# Patient Record
Sex: Female | Born: 1965 | Race: White | Hispanic: No | Marital: Married | Smoking: Never smoker
Health system: Southern US, Community
[De-identification: ages and names within clinical notes are randomized; demographics above are authoritative.]

## PROBLEM LIST (undated history)

## (undated) DIAGNOSIS — Z87898 Personal history of other specified conditions: Secondary | ICD-10-CM

## (undated) DIAGNOSIS — K219 Gastro-esophageal reflux disease without esophagitis: Secondary | ICD-10-CM

## (undated) DIAGNOSIS — R0602 Shortness of breath: Secondary | ICD-10-CM

## (undated) DIAGNOSIS — E876 Hypokalemia: Secondary | ICD-10-CM

## (undated) DIAGNOSIS — R55 Syncope and collapse: Secondary | ICD-10-CM

## (undated) DIAGNOSIS — R079 Chest pain, unspecified: Secondary | ICD-10-CM

## (undated) DIAGNOSIS — E785 Hyperlipidemia, unspecified: Secondary | ICD-10-CM

## (undated) DIAGNOSIS — F3181 Bipolar II disorder: Secondary | ICD-10-CM

## (undated) DIAGNOSIS — G43909 Migraine, unspecified, not intractable, without status migrainosus: Secondary | ICD-10-CM

## (undated) HISTORY — DX: Shortness of breath: R06.02

## (undated) HISTORY — DX: Syncope and collapse: R55

## (undated) HISTORY — DX: Chest pain, unspecified: R07.9

## (undated) HISTORY — PX: WISDOM TOOTH EXTRACTION: SHX21

## (undated) HISTORY — PX: TUBAL LIGATION: SHX77

---

## 2000-11-07 DIAGNOSIS — Z87898 Personal history of other specified conditions: Secondary | ICD-10-CM

## 2000-11-07 HISTORY — DX: Personal history of other specified conditions: Z87.898

## 2000-11-20 ENCOUNTER — Encounter: Payer: Self-pay | Admitting: Emergency Medicine

## 2000-11-20 ENCOUNTER — Emergency Department (HOSPITAL_COMMUNITY): Admission: EM | Admit: 2000-11-20 | Discharge: 2000-11-20 | Payer: Self-pay | Admitting: Emergency Medicine

## 2000-11-29 ENCOUNTER — Inpatient Hospital Stay (HOSPITAL_COMMUNITY): Admission: EM | Admit: 2000-11-29 | Discharge: 2000-12-01 | Payer: Self-pay | Admitting: Emergency Medicine

## 2000-11-29 ENCOUNTER — Encounter: Payer: Self-pay | Admitting: Emergency Medicine

## 2000-12-05 ENCOUNTER — Ambulatory Visit (HOSPITAL_COMMUNITY): Admission: RE | Admit: 2000-12-05 | Discharge: 2000-12-05 | Payer: Self-pay | Admitting: Cardiology

## 2001-01-02 ENCOUNTER — Other Ambulatory Visit: Admission: RE | Admit: 2001-01-02 | Discharge: 2001-01-02 | Payer: Self-pay | Admitting: Obstetrics and Gynecology

## 2001-01-17 ENCOUNTER — Encounter (INDEPENDENT_AMBULATORY_CARE_PROVIDER_SITE_OTHER): Payer: Self-pay | Admitting: Specialist

## 2001-01-17 ENCOUNTER — Other Ambulatory Visit: Admission: RE | Admit: 2001-01-17 | Discharge: 2001-01-17 | Payer: Self-pay | Admitting: Obstetrics and Gynecology

## 2001-02-01 ENCOUNTER — Other Ambulatory Visit: Admission: RE | Admit: 2001-02-01 | Discharge: 2001-02-01 | Payer: Self-pay | Admitting: Obstetrics and Gynecology

## 2001-02-01 ENCOUNTER — Encounter (INDEPENDENT_AMBULATORY_CARE_PROVIDER_SITE_OTHER): Payer: Self-pay | Admitting: Specialist

## 2005-08-09 ENCOUNTER — Emergency Department (HOSPITAL_COMMUNITY): Admission: EM | Admit: 2005-08-09 | Discharge: 2005-08-09 | Payer: Self-pay | Admitting: Family Medicine

## 2007-06-09 ENCOUNTER — Emergency Department (HOSPITAL_COMMUNITY): Admission: EM | Admit: 2007-06-09 | Discharge: 2007-06-09 | Payer: Self-pay | Admitting: Family Medicine

## 2008-09-10 ENCOUNTER — Emergency Department (HOSPITAL_COMMUNITY): Admission: EM | Admit: 2008-09-10 | Discharge: 2008-09-10 | Payer: Self-pay | Admitting: Family Medicine

## 2009-10-15 ENCOUNTER — Emergency Department (HOSPITAL_COMMUNITY): Admission: EM | Admit: 2009-10-15 | Discharge: 2009-10-15 | Payer: Self-pay | Admitting: Emergency Medicine

## 2010-10-21 ENCOUNTER — Emergency Department (HOSPITAL_COMMUNITY)
Admission: EM | Admit: 2010-10-21 | Discharge: 2010-10-21 | Payer: Self-pay | Source: Home / Self Care | Admitting: Emergency Medicine

## 2010-11-28 ENCOUNTER — Emergency Department (HOSPITAL_COMMUNITY)
Admission: EM | Admit: 2010-11-28 | Discharge: 2010-11-28 | Payer: Self-pay | Source: Home / Self Care | Admitting: Emergency Medicine

## 2011-01-17 LAB — POCT RAPID STREP A (OFFICE): Streptococcus, Group A Screen (Direct): NEGATIVE

## 2011-02-08 LAB — POCT I-STAT, CHEM 8
BUN: 10 mg/dL (ref 6–23)
BUN: 10 mg/dL (ref 6–23)
Calcium, Ion: 1.23 mmol/L (ref 1.12–1.32)
Calcium, Ion: 1.29 mmol/L (ref 1.12–1.32)
Chloride: 105 mEq/L (ref 96–112)
Chloride: 105 mEq/L (ref 96–112)
Creatinine, Ser: 0.7 mg/dL (ref 0.4–1.2)
Creatinine, Ser: 0.8 mg/dL (ref 0.4–1.2)
Glucose, Bld: 114 mg/dL — ABNORMAL HIGH (ref 70–99)
Glucose, Bld: 116 mg/dL — ABNORMAL HIGH (ref 70–99)
HCT: 43 % (ref 36.0–46.0)
HCT: 43 % (ref 36.0–46.0)
Hemoglobin: 14.6 g/dL (ref 12.0–15.0)
Hemoglobin: 14.6 g/dL (ref 12.0–15.0)
Potassium: 3.5 mEq/L (ref 3.5–5.1)
Potassium: 3.5 mEq/L (ref 3.5–5.1)
Sodium: 140 mEq/L (ref 135–145)
Sodium: 140 mEq/L (ref 135–145)
TCO2: 27 mmol/L (ref 0–100)
TCO2: 28 mmol/L (ref 0–100)

## 2011-02-08 LAB — MAGNESIUM: Magnesium: 2.4 mg/dL (ref 1.5–2.5)

## 2011-03-25 NOTE — Discharge Summary (Signed)
[facility]  Patient:    Alexandra Stein, Alexandra Stein                     MRN: [id number] Adm. Date:  [id number] Disc. Date: [id number] Attending:  Allean Found CC:         Arvella Merles, M.D.  Armanda Magic, M.D.  Rosanne Sack, M.D.   Discharge Summary  DISCHARGE DIAGNOSES: 1. 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  The patient not    orthostatic with normal TSH.  EEG pending.  Tilt-table test planned as an    outpatient.  No evidence of arrhythmias on telemetry. 2. History of syncope as a child in the past. 3. Tubal ligation x 2. 4. History of depression.  DISCHARGE MEDICATIONS:  None.  CONDITION ON DISCHARGE:  The patient is alert in no acute distress with no recurrent syncopal episodes this hospitalization with stable vital signs, no evidence of orthostasis or arrhythmias.  REASON FOR ADMISSION:  The patient is a 45 year old white female with Past Medical History significant for syncope who presented with recurrent syncope x 2 in the last two weeks.  HOSPITAL COURSE:  RECURRENT SYNCOPE:  It was felt to be consistent with vasovagal syncope.  Telemetry revealed no arrhythmias.  An echocardiogram by preliminary report was normal with the exception of left ventricular dimensions at the upper limit of normal.  EEGs were performed on the morning of discharge, those results are pending, and a tilt-table test will be performed by Dr. Armanda Magic as an outpatient.  The patient had no recurrent episodes while in the hospital.  There was no evidence of orthostasis.  TSH was normal at 1.02.  No evidence of metabolic derangements or anemia with a hemoglobin of 13.9.  DISCHARGE FOLLOWUP:  The patient will be discharged to home and will follow up with Dr. Mayford Knife on December 05, 2000, for tilt-table testing and review of her test results and Dr. Tiburcio Pea  as needed. DD:  12/01/00 TD:  12/02/00 Job: [id number]

## 2011-03-25 NOTE — Procedures (Signed)
[place]  Patient:    Alexandra Stein, Alexandra Stein                     MRN: [id number] Proc. Date: 12/05/00 Adm. Date:  [id number] Attending:  Armanda Magic                           Procedure Report  PROCEDURE PERFORMED:  Tilt table test  OPERATOR:  Armanda Magic, M.D.  INDICATIONS:  This is a 45 year old female with multiple episodes of syncope consistent with vasovagal syncope.  She now presents for a tilt table testing.  DESCRIPTION OF PROCEDURE:  The patient was brought to the electrophysiology laboratory in a fasting nonsedated state.  Informed consent was obtained.  The patient was connected to continuous heart rate, pulse oximeter monitoring and intermittent blood pressure monitoring.  The patients blood pressure was measured supine for five minutes and ranged from 111/76 to 121/73.  Heart rates were in the 70s.  The patient was then tilted to 70 degrees upright for a total of 30 minutes. She had several episodes of feeling hot and slightly nauseated but no significant change in her blood pressure.  She was then after 30 minutes tilted down and Isuprel was started initially at 0.5 mcg and then 1 mcg to get a 20% increase in her heart rate.  She was subsequently tilted back up to 70 degrees for a total of 15 minutes.  Heart rate increased to a maximum of 135 beats per minute.  There was no significant change in her blood pressure.  She did feel hot and light lighted with tingling and nausea but no syncope and no significant changes in blood pressure.  She was then tilted back down and transferred back to her room in stable condition.  IMPRESSION:  Negative tilt table test.  PLAN:  Will treat empirically for vasovagal syncope. She is already on Prozac 20 mg a day, we will increase to 40 mg a day.  I am going to give her a prescription for Toprol 25 mg a day as well.  She will follow up with me in two weeks. DD:  12/05/00 TD:  12/05/00 Job:  [id number]

## 2011-03-25 NOTE — Consult Note (Signed)
[facility]  Patient:    Alexandra Stein, Alexandra Stein                       MRN: [id number] Proc. Date: 11/30/00 Attending:  Armanda Magic, M.D.                          Consultation Report  CHIEF COMPLAINT:  Syncope.  HISTORY OF PRESENT ILLNESS:  This is a 45 year old white female with a history of syncope as a child who presented to the emergency room after being at school and walking across the playground and tripped over mulch and fell on her right elbow.  She had severe pain and then had a syncopal episode. Apparently, she had stiff back with toes pointed down for 40 minutes by witnesses.  She was evaluated in the hospital and it was felt to be vasovagal syncope.  This was on January 14.  Was discharged home.  Throughout that weekend she was somewhat somnolent with off balance.  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  She was running late that day and hurried to try to get her kids to lunch.  She was running down the hall and became lightheaded and nauseated.  Went to sit down and then continued to feel lightheaded and hot and then had syncope.  She presented to [facility] ER. Her initial workup has been negative.  PAST MEDICAL HISTORY:  Significant for syncope x 2 as a child, both episodes occurred when she had severe pain.  PAST SURGICAL HISTORY:  Failed tubal ligation.  ALLERGIES:  CODEINE.  MEDICATIONS:  Prozac which she has been off for one week.  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  She has three boys.  She teaches at [employer].  She denies any tobacco or alcohol use.  FAMILY HISTORY:  Her mother has dilated cardiomyopathy with no coronary disease.  She had cardiac arrest and now has an ICD.  She denies any coronary artery disease, hypertension, or diabetes in the family.  There is a history of lung cancer and breast cancer.  REVIEW OF SYSTEMS:  As stated in HPI.  PHYSICAL  EXAMINATION:  VITAL SIGNS:  Blood pressure 104/61 with heart rate of 82 lying supine, sitting blood pressure 104/64 with heart rate of 84, standing blood pressure 111/70 with heart rate of 93.  GENERAL:  This is a well-developed, well-nourished white female in no acute distress.  HEENT:  Pupils are equal, round and reactive to light and accommodation. Extraocular movements are intact bilaterally.  No scleral icterus.  NECK:  Supple without lymphadenopathy.  No carotid bruits.  Carotid upstrokes good.  LUNGS:  Clear to auscultation throughout.  HEART:  Regular rate and rhythm.  No murmurs, rubs, gallops.  Normal S1, S2.  ABDOMEN:  Benign.  EXTREMITIES:  No cyanosis, clubbing, or edema.  Good distal pulses.  NEUROLOGIC:  She is alert and oriented x 3.  LABORATORIES:  EKG shows normal sinus rhythm with T-wave abnormality which is nonspecific.  A 2-D echo is pending.  Laboratories are within normal limits.  ASSESSMENT:  Syncope of questionable etiology, probably ______ syncope with history of nausea, diaphoresis, and lightheadedness.  Had first episode of ______.  Second episode was when she was hurrying, both which could result in increased catechol release.  EKG shows only nonspecific T-wave changes. Differential diagnosis includes primary arrhythmia, although this is unlikely  with no history of palpitations, seizure, underlying coronary disease, although this is unlikely due to her age and no cardiac risk factors. Probably this is ______ syncope.  PLAN:  Await results of 2-D echocardiogram.  If within normal limits without any evidence of cardiomyopathy, will get tilt table testing. DD:  11/30/00 TD:  11/30/00 Job: [id number]

## 2011-03-25 NOTE — H&P (Signed)
[facility]  Patient:    Alexandra Stein, Alexandra Stein                       MRN: [id number] Adm. Date:  11/29/00 Attending:  Dario Guardian, M.D.                         History and Physical  DATE OF BIRTH:  1966/06/09  CHIEF COMPLAINT:  Syncope.  HISTORY OF PRESENT ILLNESS:  Patient is a 45 year old married white female who starts her medical history on November 20, 2000 ______ school she was walking across the playground, tripped over some mulch, and fell on her right elbow. She experienced severe pain at that time.  It progressed to loss of consciousness which was witnessed and she laid back stiffened with toes pointed downward and was taken to [facility] Emergency Room.  Her loss of consciousness, from what she was told, was approximately 40 minutes.  While at [facility] ER it was thought that she had passed out due to the pain.  She was examined and discharged.  She stayed out of work the rest of that week.  She was seen by her primary care physician, Dr. Holley Bouche at [facility] on January 18.  He did a full neurologic examination according to the patient and felt everything else was fine.  Had set her up for a neurologic evaluation in February which obviously has not been done. However, throughout the weekend she was more somnolent than her normal self. She was not on any medications for pain.  In fact, she stopped her Prozac during this week of dizziness.  She states that she has continued to have some off balance feeling.  However, she felt much better on January 22 when she returned to work.  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  Patient denied any headaches, blurred vision, or vertigo with this sensation.  Patient at that time began to feel even more  nauseated and laid down with the help of two other teachers.  When they sat her up she started to blackout for just a few seconds at that point.  It was decided to bring her to [facility] Emergency Room.  While in the emergency room she has had blood work which has all been normal.  Her CT scan of the head revealed no focal abnormality.  She continues to have some dizziness, some nausea, and now a headache since she has not eaten for over 12 hours.  PAST SURGICAL HISTORY:  No surgeries except for tubal ligation x 2.  MEDICATIONS:  Prozac 20 mg usually once a day but she has been off of them for approximately a week.  ALLERGIES:  CODEINE causing hallucinations and fever.  SOCIAL HISTORY:  She does not smoke.  No alcohol use.  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  She is married for the past 12 years.  She has three boys ages 54, 88, 39.  PAST OBSTETRICAL HISTORY:  She had a third pregnancy after her first tubal ligation.  Dr. Marcelle Overlie is her regular GYN.  FAMILY HISTORY:  Mom with cardiomyopathy with a collapse approximately six years ago, but survived.  No one with coronary artery disease, hypertension, or diabetes mellitus.  Maternal grandfather with lung cancer.  He worked in [employer] and also smoked.  Maternal aunt with breast cancer premenopausal.  Paternal great aunt breast cancer.  Maternal grandmother left leg probable sarcoma now age 60 and doing well.  REVIEW OF SYSTEMS:  Patients main concern at this time is nausea, lightheadedness, and dizziness.  She has no dyspnea, no chest pain.  She has slight headache but she states that she gets this when she has not eaten in a while.  PHYSICAL EXAMINATION:  VITAL SIGNS:  Orthostatics done per the ER staff:  Lying down blood pressure 113/72, pulse 72.  Sitting blood pressure 110/76, pulse 84.  Standing blood pressure 117/77, pulse 86.  Temperature 97.4, respiratory rate 20-24.  HEENT:  TMs are normal  bilaterally.  Throat is clear.  Dentition is good.  NEUROLOGIC:  Cranial nerves 2-11 are intact.  Pupils are equal, round and reactive to light.  Extraocular movements are intact bilaterally.  Fundi are sharp.  No papilledema seen.  LUNGS:  Clear to auscultation.  HEART:  Regular rate and rhythm with no rubs, murmurs, or gallops.  She has a normal PMI mid clavicular line.  BREASTS:  Normal.  ABDOMEN:  Positive bowel sounds.  No hepatosplenomegaly.  No tenderness.  EXTREMITIES:  Without edema or cyanosis.  She does feel some paraesthesias feeling.  Strength 5/5.  SKIN:  Unremarkable.  PELVIC:  Deferred to Dr. Marcelle Overlie.  RECTAL:  Deferred to Dr. Marcelle Overlie.  LABORATORIES:  Hemoglobin 13.4, hematocrit 38.4.  Chemistry panel basically normal with a slightly low normal potassium of 3.6, sodium 137, calcium 9.2.  Her EKG is normal sinus rhythm.  CT head was normal.  ASSESSMENT AND PLAN:  Syncopal episode x 2.  Etiology unknown at this time. It could be a neurologic etiology.  Need to rule out seizures versus cardiac causing decrease in blood pressure, although this was not found on orthostatics.  Begin workup for neurologic problem.  EEG to be scheduled. Consider neurology consult.  Consider her first episode of dizziness to be secondary to pain, but not quite sure what the second one would be caused by. Obtain a cardiology workup if neurologic turns up negative.  Patient with a history of failed bilateral tubal ligation.  Will get a serum pregnancy test to make sure this is not a second failure.  Given Phenergan for nausea.  Treat headaches with Tylenol or ibuprofen.  I doubt any of this is secondary to being off of Prozac for a week but this must be considered. DD:  11/29/00 TD:  11/29/00 Job: [id number]

## 2011-07-25 ENCOUNTER — Encounter (HOSPITAL_COMMUNITY): Payer: Self-pay

## 2011-07-25 ENCOUNTER — Other Ambulatory Visit (HOSPITAL_COMMUNITY): Payer: Self-pay | Admitting: Family Medicine

## 2011-07-25 ENCOUNTER — Ambulatory Visit (HOSPITAL_COMMUNITY)
Admission: RE | Admit: 2011-07-25 | Discharge: 2011-07-25 | Disposition: A | Discharge status: Home / Self Care | Payer: 59 | Source: Ambulatory Visit | Attending: Family Medicine | Admitting: Family Medicine

## 2011-07-25 DIAGNOSIS — R51 Headache: Secondary | ICD-10-CM

## 2011-10-12 ENCOUNTER — Encounter (HOSPITAL_COMMUNITY): Payer: Self-pay | Admitting: Emergency Medicine

## 2011-10-12 ENCOUNTER — Emergency Department (HOSPITAL_COMMUNITY)
Admission: EM | Admit: 2011-10-12 | Discharge: 2011-10-12 | Disposition: A | Discharge status: Home / Self Care | Payer: Self-pay | Attending: Emergency Medicine | Admitting: Emergency Medicine

## 2011-10-12 DIAGNOSIS — R209 Unspecified disturbances of skin sensation: Secondary | ICD-10-CM | POA: Insufficient documentation

## 2011-10-12 DIAGNOSIS — X19XXXA Contact with other heat and hot substances, initial encounter: Secondary | ICD-10-CM | POA: Insufficient documentation

## 2011-10-12 DIAGNOSIS — Y93G3 Activity, cooking and baking: Secondary | ICD-10-CM | POA: Insufficient documentation

## 2011-10-12 DIAGNOSIS — T300 Burn of unspecified body region, unspecified degree: Secondary | ICD-10-CM

## 2011-10-12 DIAGNOSIS — T23169A Burn of first degree of back of unspecified hand, initial encounter: Secondary | ICD-10-CM | POA: Insufficient documentation

## 2011-10-12 DIAGNOSIS — M79609 Pain in unspecified limb: Secondary | ICD-10-CM | POA: Insufficient documentation

## 2011-10-12 MED ORDER — SILVER SULFADIAZINE 1 % EX CREA
TOPICAL_CREAM | Freq: Every day | CUTANEOUS | Status: DC
  Administered 2011-10-12: 21:00:00 via TOPICAL
  Filled 2011-10-12: qty 85

## 2011-10-12 MED ORDER — PREDNISONE 10 MG PO TABS: 20.0000 mg | ORAL_TABLET | Freq: Every day | ORAL | Status: DC

## 2011-10-12 NOTE — ED Notes (Signed)
Pt st's she burned her hand on a hot pot 6 days ago.  St's area blistered, then appeared to be getting infected.   St's to her hand is starting to feel numb.

## 2011-10-12 NOTE — ED Provider Notes (Signed)
History     CSN: [id number] Arrival date & time: 10/12/2011  5:21 PM   None     No chief complaint on file.   (Consider location/radiation/quality/duration/timing/severity/associated sxs/prior treatment) Patient is a 45 y.o. female presenting with burn. The history is provided by the patient.  Burn The incident occurred more than 2 days ago. The burns occurred in the kitchen. The burns occurred while cooking. The burns were a result of contact with a hot surface. The burns are located on the right hand. The burns appear red and painful. The pain is at a severity of 5/10. The pain is moderate. She has tried ice and salve for the symptoms. The treatment provided moderate relief.   Pt burned her hand on pot 6 days ago and has since then been healing well. Pt has been feeling sensations of tingling and numbness to the area but no pain.   History reviewed. No pertinent past medical history.  Past Surgical History  Procedure Date  . Cesarean section     No family history on file.  History  Substance Use Topics  . Smoking status: Not on file  . Smokeless tobacco: Not on file  . Alcohol Use:     OB History    Grav Para Term Preterm Abortions TAB SAB Ect Mult Living                  Review of Systems  All other systems reviewed and are negative.    Allergies  Codeine  Home Medications   Current Outpatient Rx  Name Route Sig Dispense Refill  . ARIPIPRAZOLE 5 MG PO TABS Oral Take 5 mg by mouth every morning.      . DULOXETINE HCL 60 MG PO CPEP Oral Take 60 mg by mouth daily.      Marland Kitchen LAMOTRIGINE ER 200 MG PO TB24 Oral Take 1 tablet by mouth every evening.      Marland Kitchen OLANZAPINE 5 MG PO TABS Oral Take 5 mg by mouth at bedtime.      . TOPIRAMATE 100 MG PO TABS Oral Take 100 mg by mouth at bedtime.        BP 111/70  Pulse 66  Temp(Src) 97.8 F (36.6 C) (Oral)  Resp 16  SpO2 100%  Physical Exam  Constitutional: She appears well-developed and well-nourished.  HENT:    Head: Normocephalic and atraumatic.  Eyes: Pupils are equal, round, and reactive to light.  Neck: Trachea normal, normal range of motion and full passive range of motion without pain. Neck supple.  Cardiovascular: Normal rate, regular rhythm and normal pulses.   Pulmonary/Chest: Effort normal and breath sounds normal. Chest wall is not dull to percussion. She exhibits no tenderness, no crepitus, no edema, no deformity and no retraction.  Abdominal: Normal appearance.  Musculoskeletal: Normal range of motion.       Right hand: She exhibits bony tenderness. She exhibits normal range of motion, no tenderness, normal two-point discrimination, normal capillary refill, no deformity, no laceration and no swelling. normal sensation noted. Normal strength noted.       Hands: Neurological: She is alert. She has normal strength.  Skin: Skin is warm, dry and intact.  Psychiatric: She has a normal mood and affect. Her speech is normal. Thought content normal. Cognition and memory are normal.    ED Course  Procedures (including critical care time)  Labs Reviewed - No data to display No results found.   No diagnosis found.    MDM  Dorthula Matas, PA 10/12/11 2011

## 2011-10-12 NOTE — ED Notes (Signed)
Burn dressed with silvadene & bacitracin applied to borders. Covered with dry dressing, wrapped in gauze and secured with paper tape. Pt tolerated well. Provided pt with education on wound care and dressing. Pt verbalized understanding.

## 2011-10-13 NOTE — ED Provider Notes (Signed)
Medical screening examination/treatment/procedure(s) were performed by non-physician practitioner and as supervising physician I was immediately available for consultation/collaboration.   Kashauna Celmer W. Holliday Sheaffer, MD 10/13/11 1842 

## 2011-12-12 ENCOUNTER — Inpatient Hospital Stay (HOSPITAL_COMMUNITY)
Admission: EM | Admit: 2011-12-12 | Discharge: 2011-12-16 | DRG: 143 | Disposition: A | Discharge status: Home / Self Care | Payer: BC Managed Care – PPO | Attending: Internal Medicine | Admitting: Internal Medicine

## 2011-12-12 ENCOUNTER — Emergency Department (HOSPITAL_COMMUNITY): Payer: BC Managed Care – PPO

## 2011-12-12 ENCOUNTER — Other Ambulatory Visit: Payer: Self-pay

## 2011-12-12 ENCOUNTER — Observation Stay (HOSPITAL_COMMUNITY): Payer: BC Managed Care – PPO

## 2011-12-12 ENCOUNTER — Encounter (HOSPITAL_COMMUNITY): Payer: Self-pay

## 2011-12-12 DIAGNOSIS — E876 Hypokalemia: Secondary | ICD-10-CM | POA: Diagnosis present

## 2011-12-12 DIAGNOSIS — R1110 Vomiting, unspecified: Secondary | ICD-10-CM | POA: Diagnosis present

## 2011-12-12 DIAGNOSIS — Z888 Allergy status to other drugs, medicaments and biological substances status: Secondary | ICD-10-CM

## 2011-12-12 DIAGNOSIS — R0602 Shortness of breath: Secondary | ICD-10-CM

## 2011-12-12 DIAGNOSIS — Z6837 Body mass index (BMI) 37.0-37.9, adult: Secondary | ICD-10-CM

## 2011-12-12 DIAGNOSIS — E785 Hyperlipidemia, unspecified: Secondary | ICD-10-CM | POA: Diagnosis present

## 2011-12-12 DIAGNOSIS — R0789 Other chest pain: Principal | ICD-10-CM | POA: Diagnosis present

## 2011-12-12 DIAGNOSIS — I959 Hypotension, unspecified: Secondary | ICD-10-CM

## 2011-12-12 DIAGNOSIS — E669 Obesity, unspecified: Secondary | ICD-10-CM | POA: Diagnosis present

## 2011-12-12 DIAGNOSIS — F3189 Other bipolar disorder: Secondary | ICD-10-CM | POA: Diagnosis present

## 2011-12-12 DIAGNOSIS — Z79899 Other long term (current) drug therapy: Secondary | ICD-10-CM

## 2011-12-12 DIAGNOSIS — R079 Chest pain, unspecified: Secondary | ICD-10-CM

## 2011-12-12 DIAGNOSIS — R9431 Abnormal electrocardiogram [ECG] [EKG]: Secondary | ICD-10-CM | POA: Diagnosis present

## 2011-12-12 HISTORY — DX: Personal history of other specified conditions: Z87.898

## 2011-12-12 HISTORY — DX: Bipolar II disorder: F31.81

## 2011-12-12 HISTORY — DX: Migraine, unspecified, not intractable, without status migrainosus: G43.909

## 2011-12-12 HISTORY — DX: Hyperlipidemia, unspecified: E78.5

## 2011-12-12 LAB — COMPREHENSIVE METABOLIC PANEL
ALT: 16 U/L (ref 0–35)
AST: 19 U/L (ref 0–37)
Calcium: 8.8 mg/dL (ref 8.4–10.5)
GFR calc Af Amer: >90 mL/min (ref >90)
Sodium: 138 mEq/L (ref 135–145)
Total Protein: 7.1 g/dL (ref 6.0–8.3)

## 2011-12-12 LAB — URINALYSIS, ROUTINE W REFLEX MICROSCOPIC
Glucose, UA: NEGATIVE mg/dL
Leukocytes, UA: NEGATIVE
Specific Gravity, Urine: 1.028 (ref 1.005–1.030)
Urobilinogen, UA: 1 mg/dL (ref 0.0–1.0)

## 2011-12-12 LAB — HEPATIC FUNCTION PANEL
AST: 18 U/L (ref 0–37)
Albumin: 3.1 g/dL — ABNORMAL LOW (ref 3.5–5.2)
Alkaline Phosphatase: 64 U/L (ref 39–117)
Total Bilirubin: 0.5 mg/dL (ref 0.3–1.2)
Total Protein: 6.5 g/dL (ref 6.0–8.3)

## 2011-12-12 LAB — URINE MICROSCOPIC-ADD ON

## 2011-12-12 LAB — DIFFERENTIAL
Basophils Absolute: 0 10*3/uL (ref 0.0–0.1)
Eosinophils Absolute: 0.1 10*3/uL (ref 0.0–0.7)
Eosinophils Relative: 2 % (ref 0–5)

## 2011-12-12 LAB — POCT I-STAT TROPONIN I
Troponin i, poc: 0 ng/mL (ref 0.00–0.08)
Troponin i, poc: 0 ng/mL (ref 0.00–0.08)

## 2011-12-12 LAB — D-DIMER, QUANTITATIVE: D-Dimer, Quant: 0.5 ug/mL-FEU — ABNORMAL HIGH (ref 0.00–0.48)

## 2011-12-12 LAB — LIPID PANEL
Cholesterol: 182 mg/dL (ref 0–200)
Triglycerides: 107 mg/dL (ref <150)

## 2011-12-12 LAB — CBC
MCH: 29.7 pg (ref 26.0–34.0)
MCV: 89.7 fL (ref 78.0–100.0)
Platelets: 223 10*3/uL (ref 150–400)
RDW: 13 % (ref 11.5–15.5)

## 2011-12-12 MED ORDER — LAMOTRIGINE ER 100 MG PO TB24: 200.0000 mg | ORAL_TABLET | Freq: Every day | ORAL | Status: DC

## 2011-12-12 MED ORDER — TOPIRAMATE 100 MG PO TABS
100.0000 mg | ORAL_TABLET | Freq: Every day | ORAL | Status: DC
  Administered 2011-12-12: 100 mg via ORAL
  Filled 2011-12-12 (×3): qty 1

## 2011-12-12 MED ORDER — NITROGLYCERIN 0.4 MG SL SUBL
SUBLINGUAL_TABLET | SUBLINGUAL | Status: AC
  Filled 2011-12-12: qty 25

## 2011-12-12 MED ORDER — SODIUM CHLORIDE 0.9 % IV SOLN
INTRAVENOUS | Status: DC
  Administered 2011-12-12 – 2011-12-13 (×2): via INTRAVENOUS
  Administered 2011-12-14: 10 mL/h via INTRAVENOUS

## 2011-12-12 MED ORDER — NITROGLYCERIN 0.4 MG SL SUBL
0.4000 mg | SUBLINGUAL_TABLET | SUBLINGUAL | Status: DC | PRN
  Administered 2011-12-12 (×2): 0.4 mg via SUBLINGUAL

## 2011-12-12 MED ORDER — PANTOPRAZOLE SODIUM 40 MG IV SOLR
40.0000 mg | Freq: Two times a day (BID) | INTRAVENOUS | Status: DC
  Administered 2011-12-12 – 2011-12-13 (×2): 40 mg via INTRAVENOUS
  Filled 2011-12-12 (×3): qty 40

## 2011-12-12 MED ORDER — POTASSIUM CHLORIDE CRYS ER 20 MEQ PO TBCR: 40.0000 meq | EXTENDED_RELEASE_TABLET | Freq: Once | ORAL | Status: DC

## 2011-12-12 MED ORDER — OLANZAPINE 5 MG PO TABS
5.0000 mg | ORAL_TABLET | Freq: Every day | ORAL | Status: DC
  Administered 2011-12-12: 5 mg via ORAL
  Filled 2011-12-12 (×3): qty 1

## 2011-12-12 MED ORDER — IOHEXOL 300 MG/ML  SOLN
70.0000 mL | Freq: Once | INTRAMUSCULAR | Status: AC | PRN
  Administered 2011-12-12: 70 mL via INTRAVENOUS

## 2011-12-12 MED ORDER — HYDROCODONE-ACETAMINOPHEN 5-325 MG PO TABS
1.0000 | ORAL_TABLET | ORAL | Status: DC | PRN
  Administered 2011-12-16: 1 via ORAL
  Filled 2011-12-12: qty 2
  Filled 2011-12-12: qty 1

## 2011-12-12 MED ORDER — ONDANSETRON HCL 4 MG/2ML IJ SOLN
INTRAMUSCULAR | Status: AC
  Administered 2011-12-12: 4 mg
  Filled 2011-12-12: qty 2

## 2011-12-12 MED ORDER — SODIUM CHLORIDE 0.9 % IV BOLUS (SEPSIS)
500.0000 mL | Freq: Once | INTRAVENOUS | Status: AC
  Administered 2011-12-12: 500 mL via INTRAVENOUS

## 2011-12-12 MED ORDER — ONDANSETRON HCL 4 MG/2ML IJ SOLN
4.0000 mg | Freq: Four times a day (QID) | INTRAMUSCULAR | Status: DC | PRN
  Administered 2011-12-13 – 2011-12-15 (×3): 4 mg via INTRAVENOUS
  Filled 2011-12-12 (×4): qty 2

## 2011-12-12 MED ORDER — MORPHINE SULFATE 4 MG/ML IJ SOLN: 4.0000 mg | INTRAMUSCULAR | Status: DC | PRN

## 2011-12-12 MED ORDER — LAMOTRIGINE ER 200 MG PO TB24: 1.0000 | ORAL_TABLET | Freq: Every evening | ORAL | Status: DC

## 2011-12-12 MED ORDER — ARIPIPRAZOLE 10 MG PO TABS
10.0000 mg | ORAL_TABLET | Freq: Every day | ORAL | Status: DC
  Administered 2011-12-12: 10 mg via ORAL
  Filled 2011-12-12 (×2): qty 1

## 2011-12-12 MED ORDER — ACETAMINOPHEN 650 MG RE SUPP: 650.0000 mg | Freq: Four times a day (QID) | RECTAL | Status: DC | PRN

## 2011-12-12 MED ORDER — ONDANSETRON HCL 4 MG/2ML IJ SOLN: 4.0000 mg | Freq: Three times a day (TID) | INTRAMUSCULAR | Status: DC | PRN

## 2011-12-12 MED ORDER — ONDANSETRON HCL 4 MG PO TABS
4.0000 mg | ORAL_TABLET | Freq: Four times a day (QID) | ORAL | Status: DC | PRN
  Administered 2011-12-15 – 2011-12-16 (×2): 4 mg via ORAL
  Filled 2011-12-12 (×2): qty 1

## 2011-12-12 MED ORDER — NITROGLYCERIN 0.4 MG SL SUBL: 0.4000 mg | SUBLINGUAL_TABLET | SUBLINGUAL | Status: DC | PRN

## 2011-12-12 MED ORDER — ASPIRIN 81 MG PO CHEW
324.0000 mg | CHEWABLE_TABLET | Freq: Once | ORAL | Status: AC
  Administered 2011-12-12: 324 mg via ORAL
  Filled 2011-12-12: qty 4

## 2011-12-12 MED ORDER — HYDROMORPHONE HCL PF 1 MG/ML IJ SOLN: 0.5000 mg | INTRAMUSCULAR | Status: DC | PRN

## 2011-12-12 MED ORDER — METOPROLOL TARTRATE 25 MG PO TABS
100.0000 mg | ORAL_TABLET | Freq: Once | ORAL | Status: AC
  Administered 2011-12-12: 100 mg via ORAL
  Filled 2011-12-12: qty 4

## 2011-12-12 MED ORDER — ACETAMINOPHEN 325 MG PO TABS
650.0000 mg | ORAL_TABLET | Freq: Four times a day (QID) | ORAL | Status: DC | PRN
  Administered 2011-12-15 – 2011-12-16 (×2): 650 mg via ORAL
  Filled 2011-12-12 (×2): qty 2

## 2011-12-12 MED ORDER — MORPHINE SULFATE 2 MG/ML IJ SOLN: 0.5000 mg | INTRAMUSCULAR | Status: DC | PRN

## 2011-12-12 MED ORDER — MORPHINE SULFATE 4 MG/ML IJ SOLN
4.0000 mg | Freq: Once | INTRAMUSCULAR | Status: AC
  Administered 2011-12-12: 4 mg via INTRAVENOUS
  Filled 2011-12-12: qty 1

## 2011-12-12 MED ORDER — SODIUM CHLORIDE 0.9 % IV SOLN
INTRAVENOUS | Status: DC
  Administered 2011-12-12: 18:00:00 via INTRAVENOUS

## 2011-12-12 MED ORDER — DULOXETINE HCL 60 MG PO CPEP
60.0000 mg | ORAL_CAPSULE | Freq: Every day | ORAL | Status: DC
  Administered 2011-12-12: 60 mg via ORAL
  Filled 2011-12-12 (×2): qty 1

## 2011-12-12 NOTE — Progress Notes (Signed)
Observation review is complete. 

## 2011-12-12 NOTE — ED Notes (Signed)
Patient denies pain and is resting comfortably.  

## 2011-12-12 NOTE — ED Notes (Signed)
Admitting md in to see pt.  

## 2011-12-12 NOTE — ED Notes (Addendum)
Pt placed back on monitor, continuous pulse oximetry and blood pressure cuff 

## 2011-12-12 NOTE — ED Notes (Signed)
Patient states that Saturday she had onset of left sided chest pain with radiation into her left shoulder which also included numbness, headache, shortness of breath, vomiting, and dizziness. She took some asa on Saturday but no meds pta. Alert and oriented. Breath sounds are clear and bowel sounds are present. Pt [facility] from [facility]ck pot and she states that at one time her ekg was read as "abnormal" but there was nothing acute on the reading. Pt has no cardiologist but is seen by pcp dr. Azucena Kuba. Pt informed of plan of care. No complaints at this time. [facility] rn has taken over care for this patient.

## 2011-12-12 NOTE — ED Notes (Signed)
Patient states that a few days ago (sat) she had onset of left sided chest pain, nausea, headaches, and numbness to the left arm. She feels short of breath even while speaking. She states that this morning she felt so short of breath that she felt as if she was going to faint. She is alert and oriented. No meds pta. She states that she feels nauseated currently and has a frontal headache.

## 2011-12-12 NOTE — ED Notes (Signed)
Patient transported to CT 

## 2011-12-12 NOTE — H&P (Signed)
Hospital Admission Note Date: 12/12/2011  PCP: No primary provider on file.  Chief Complaint: Chest pain.   History of Present Illness: 46 year old female with past medical history significant for bipolar present to the emergency department complaining of chest pain. She related she has had 2 episodes 1 the date of admission and the other 2 days prior to admission. She described the pain as sharp in quality, last 15-20 minutes, accompanied with shortness of breath, confusion, numbness of her arm, dizziness. She denies chest pain on exertion. She does relate shortness of breath on exertion for the last week. She denies cough, fever. The date of admission she had severe episode of chest pain, she felt that she was going to  past out. The pain was 9/10 in intensity. She received nitroglycerin in the emergency department which helped with her pain. She reports a episode of vomiting 2 days prior to admission, she vomited like 10 times, the last time she vomited small amount of blood. No more vomiting. She denies abdominal pain.     Allergies: Codeine Past Medical History  Diagnosis Date  . Bipolar 1 disorder, depressed   . Migraines    Prior to Admission medications   Medication Sig Start Date End Date Taking? Authorizing Provider  ARIPiprazole (ABILIFY) 5 MG tablet Take 10 mg by mouth daily.    Yes Historical Provider, MD  DULoxetine (CYMBALTA) 60 MG capsule Take 60 mg by mouth daily.    Yes Historical Provider, MD  LamoTRIgine (LAMICTAL XR) 200 MG TB24 Take 1 tablet by mouth every evening.    Yes Historical Provider, MD  naproxen sodium (ANAPROX) 220 MG tablet Take 220 mg by mouth 2 (two) times daily as needed. For pain   Yes Historical Provider, MD  OLANZapine (ZYPREXA) 5 MG tablet Take 5 mg by mouth at bedtime.    Yes Historical Provider, MD  promethazine (PHENERGAN) 25 MG tablet Take 25 mg by mouth every 6 (six) hours as needed. For nausea   Yes Historical Provider, MD  topiramate (TOPAMAX)  100 MG tablet Take 100 mg by mouth at bedtime.    Yes Historical Provider, MD   Past Surgical History  Procedure Date  . Cesarean section         Tubal ligation.  History reviewed. No pertinent family history. History   Social History  . Marital Status: Married    Spouse Name: N/A    Number of Children: N/A  . Years of Education: N/A   Occupational History    she is a Runner, broadcasting/film/video.   Social History Main Topics  . Smoking status: Never Smoker   . Smokeless tobacco: Not on file  . Alcohol Use: No  . Drug Use: No  . Sexually Active:     Family history; Mother has history of heart disease, had an MI at the age of 53. Father: No known medical problems.  She has a brother is 58 year old and his healthy.  REVIEW OF SYSTEMS:  Constitutional:  No weight loss, night sweats, Fevers, chills, fatigue.  HEENT:  No headaches, Difficulty swallowing,Tooth/dental problems,Sore throat,  No sneezing, itching, ear ache, nasal congestion, post nasal drip,  Cardio-vascular:  , swelling in lower extremities, anasarca,  GI:  No heartburn, indigestion, abdominal pain, ,, diarrhea, change in bowel habits, loss of appetite  Resp:   No excess mucus, no productive cough, No non-productive cough, No coughing up of blood.No change in color of mucus.No wheezing.No chest wall deformity  Skin:  no rash  or lesions.  GU:  no dysuria, change in color of urine, no urgency or frequency. No flank pain.  Musculoskeletal:  No joint pain or swelling. No decreased range of motion. No back pain.    Physical Exam: Filed Vitals:   12/12/11 1256 12/12/11 1344 12/12/11 1536 12/12/11 1730  BP: 110/66 101/51 93/59 97/76   Pulse: 69 59 56 63  Temp:      TempSrc:      Resp: 18 18 18    Height:      Weight:      SpO2: 100% 99% 99% 98%   No intake or output data in the 24 hours ending 12/12/11 1746 BP 97/76  Pulse 63  Temp(Src) 98.1 F (36.7 C) (Oral)  Resp 18  Ht 5\' 2"  (1.575 m)  Wt 93.441 kg (206 lb)  BMI  37.68 kg/m2  SpO2 98%  LMP 11/18/2011  General Appearance:    Alert, cooperative, no distress, appears stated age  Head:    Normocephalic, without obvious abnormality, atraumatic  Eyes:    PERRL, conjunctiva/corneas clear, EOM's intact,     Ears:    Normal TM's and external ear canals, both ears  Nose:   Nares normal, septum midline, mucosa normal, no drainage    or sinus tenderness  Throat:   Lips, mucosa, and tongue normal; teeth and gums normal  Neck:   Supple, symmetrical, trachea midline, no adenopathy;    thyroid:  no enlargement/tenderness/nodules; no carotid   bruit or JVD  Back:     Symmetric, no curvature, ROM normal, no CVA tenderness  Lungs:     Clear to auscultation bilaterally, respirations unlabored  Chest Wall:    No tenderness or deformity   Heart:    Regular rate and rhythm, S1 and S2 normal, no murmur, rub   or gallop  Breast Exam:    No tenderness, masses, or nipple abnormality  Abdomen:     Soft, non-tender, bowel sounds active all four quadrants,    no masses, no organomegaly        Extremities:   Extremities normal, atraumatic, no cyanosis or edema  Pulses:   2+ and symmetric all extremities  Skin:   Skin color, texture, turgor normal, no rashes or lesions  Lymph nodes:   Cervical, supraclavicular, and axillary nodes normal  Neurologic:   CNII-XII intact, normal strength, sensation and reflexes    throughout   Lab results:  Basename 12/12/11 1002  NA 138  K 3.1*  CL 108  CO2 20  GLUCOSE 102*  BUN 11  CREATININE 0.88  CALCIUM 8.8  MG --  PHOS --    Basename 12/12/11 1002  AST 19  ALT 16  ALKPHOS 72  BILITOT 0.5  PROT 7.1  ALBUMIN 3.4*    Basename 12/12/11 1002  LIPASE 26  AMYLASE --    Basename 12/12/11 1002  WBC 5.2  NEUTROABS 2.9  HGB 13.2  HCT 39.9  MCV 89.7  PLT 223    Basename 12/12/11 1002  DDIMER 0.50*   Imaging results:  Dg Chest 2 View  12/12/2011  *RADIOLOGY REPORT*  Clinical Data: Left upper chest pain, shortness  of breath.  CHEST - 2 VIEW  Comparison: None.  Findings: There are mildly accentuated bronchial markings.  There is an area of linear atelectasis versus scarring seen within the right middle lobe.  There are no infiltrative or edematous changes. The heart and mediastinal structures are normal.  IMPRESSION: Mildly accentuated bronchial markings and small area of  linear scarring versus atelectasis within the right middle lobe. Otherwise, negative study.  Original Report Authenticated By: Rolla Plate, M.D.   Ct Angio Chest W/cm &/or Wo Cm  12/12/2011  *RADIOLOGY REPORT*  Clinical Data: Left-sided chest pain with radiation to left shoulder  CT ANGIOGRAPHY CHEST  Technique:  Multidetector CT imaging of the chest using the standard protocol during bolus administration of intravenous contrast. Multiplanar reconstructed images including MIPs were obtained and reviewed to evaluate the vascular anatomy.  Contrast: 70mL OMNIPAQUE IOHEXOL 300 MG/ML IV SOLN  Comparison: Chest radiograph of earlier same day  Findings:  There is adequate opacification of the pulmonary arteries and main pulmonary artery measuring 317 HU).  There are no discrete filling defects within pulmonary arteries to suggest acute pulmonary embolism.  Normal caliber of the main pulmonary artery.  The thoracic aorta is of normal caliber.  No definite periaortic stranding.  Normal configuration of the aortic arch.  Visualized portions of the cervical vasculature appear patent.  Normal heart size.  No pericardial effusion.  No hilar, mediastinal or axillary lymphadenopathy.  There is minimal symmetric dependent ground-glass opacities favored to represent atelectasis.  Linear opacities in the medial aspect of the lingula and right middle lobe also favored to represent plate- like atelectasis.  No focal airspace opacities.  There is minimal central m r face enemas change, most conspicuous in the left upper lobe.  No discrete pulmonary nodules.  Central  airways are patent. No pneumothorax or pleural effusion.  Limited early arterial phase evaluation of the upper abdomen is unremarkable.  The thyroid appears heterogeneous without discrete nodule.  No acute or aggressive osseous abnormalities. There is nonspecific mild diffuse increased sclerosis of the imaged osseous structures.  IMPRESSION:  1.  No acute findings within the chest.  Specifically, no evidence of pulmonary embolism.  2. Nonspecific mild diffuse increase sclerosis of the imaged osseous structures may suggest an underlying metabolic disorder. Clinical correlation is advised.  Original Report Authenticated By: Waynard Reeds, M.D.   Other results: EKG: No ST elevation  Patient Active Hospital Problem List:  Chest pain (12/12/2011)  Patient is was still having chest pain she would be admitted to the step down unit, we'll admit patient to rule out acute coronary syndrome. Will cycle cardiac enzymes, check Hb A1c,  fasting lipid panel, TSH. She received aspirin and beta blocker in the emergency department. I will hold on  metoprolol at this time due to hypotension. I will restart Protonix. I will check ECHO. She had a CT angio that was negative for PE.    Hypotension (12/12/2011)  Probably related to medication, patient received lopressor, nitroglycerin, and dilaudid  in the emergency department. I will give 500 cc bolus. Will start IV fluid normal saline 125 per hours Vomiting: resolved, Maybe gastroenteritis.  Bipolar: Continue with home medications. I will hold Zyprexa to avoid worsening hypotension.     Karam Dunson M.D. [facility] [phone] 12/12/2011, 5:46 PM

## 2011-12-12 NOTE — ED Notes (Signed)
Pt given a pillow.  

## 2011-12-12 NOTE — ED Notes (Signed)
Pt c/o cp and sob. States cp feels stabbing in nature, left sided, non radiating. PA aware, ntg sl ordered.

## 2011-12-12 NOTE — ED Provider Notes (Signed)
History     CSN: [id number]  Arrival date & time 12/12/11  0908   First MD Initiated Contact with Patient 12/12/11 0956      10:08 AM HPI Patient reports a left-sided chest pain that began Saturday and he still. States this morning she began to have the same chest pain. Reports symptoms have been associated with severe exertional shortness of breath, nausea, headaches, and left arm numbness.  Patient is a 46 y.o. female presenting with chest pain. The history is provided by the patient.  Chest Pain The chest pain began 2 days ago. Chest pain occurs intermittently. The chest pain is worsening. The pain is associated with exertion. The severity of the pain is severe. The quality of the pain is described as sharp. The pain radiates to the left shoulder. Chest pain is worsened by exertion. Primary symptoms include shortness of breath, nausea and vomiting. Pertinent negatives for primary symptoms include no fever, no fatigue, no cough, no wheezing, no palpitations, no abdominal pain and no dizziness.  The shortness of breath began 2 days ago. The shortness of breath is severe.  Pertinent negatives for associated symptoms include no diaphoresis, no numbness and no weakness.  Pertinent negatives for past medical history include no CAD, no cancer, no diabetes, no DVT, no hyperlipidemia, no hypertension, no MI and no PE.  Her family medical history is significant for early MI in family.     Past Medical History  Diagnosis Date  . Bipolar 1 disorder, depressed   . Migraines     Past Surgical History  Procedure Date  . Cesarean section     History reviewed. No pertinent family history.  History  Substance Use Topics  . Smoking status: Never Smoker   . Smokeless tobacco: Not on file  . Alcohol Use: No    OB History    Grav Para Term Preterm Abortions TAB SAB Ect Mult Living                  Review of Systems  Constitutional: Negative for fever, diaphoresis and fatigue.  HENT:  Negative for neck pain.   Respiratory: Positive for shortness of breath. Negative for cough and wheezing.   Cardiovascular: Positive for chest pain. Negative for palpitations and leg swelling.  Gastrointestinal: Positive for nausea and vomiting. Negative for abdominal pain.  Neurological: Negative for dizziness, weakness, numbness and headaches.  All other systems reviewed and are negative.    Allergies  Codeine  Home Medications   Current Outpatient Rx  Name Route Sig Dispense Refill  . ARIPIPRAZOLE 5 MG PO TABS Oral Take 10 mg by mouth daily.     . DULOXETINE HCL 60 MG PO CPEP Oral Take 60 mg by mouth daily.     Marland Kitchen LAMOTRIGINE ER 200 MG PO TB24 Oral Take 1 tablet by mouth every evening.     Marland Kitchen NAPROXEN SODIUM 220 MG PO TABS Oral Take 220 mg by mouth 2 (two) times daily as needed. For pain    . OLANZAPINE 5 MG PO TABS Oral Take 5 mg by mouth at bedtime.     Marland Kitchen PROMETHAZINE HCL 25 MG PO TABS Oral Take 25 mg by mouth every 6 (six) hours as needed. For nausea    . TOPIRAMATE 100 MG PO TABS Oral Take 100 mg by mouth at bedtime.       BP 117/101  Pulse 88  Temp(Src) 98.2 F (36.8 C) (Oral)  Resp 18  SpO2 98%  LMP 11/18/2011  Physical Exam  Vitals reviewed. Constitutional: She is oriented to person, place, and time. Vital signs are normal. She appears well-developed and well-nourished.  HENT:  Head: Normocephalic and atraumatic.  Eyes: Conjunctivae are normal. Pupils are equal, round, and reactive to light.  Neck: Normal range of motion. Neck supple.  Cardiovascular: Normal rate, regular rhythm and normal heart sounds.  Exam reveals no friction rub.   No murmur heard. Pulmonary/Chest: Effort normal and breath sounds normal. She has no wheezes. She has no rhonchi. She has no rales. She exhibits no tenderness.  Abdominal: She exhibits no distension. There is no tenderness. There is no rebound and no guarding.  Musculoskeletal: Normal range of motion.  Neurological: She is alert and  oriented to person, place, and time. Coordination normal.  Skin: Skin is warm and dry. No rash noted. No erythema. No pallor.    ED Course  Procedures    Date: 12/12/2011  Rate: 91  Rhythm: normal sinus rhythm  QRS Axis: normal  Intervals: normal  ST/T Wave abnormalities: Nonspecific ST and T wave abnormality, Inverted T-waves in V3,V4, V5  Conduction Disutrbances: none  Narrative Interpretation:   Old EKG Reviewed: changed since 10/15/2009  Results for orders placed during the hospital encounter of 12/12/11  CBC      Component Value Range   WBC 5.2  4.0 - 10.5 (K/uL)   RBC 4.45  3.87 - 5.11 (MIL/uL)   Hemoglobin 13.2  12.0 - 15.0 (g/dL)   HCT 40.9  81.1 - 91.4 (%)   MCV 89.7  78.0 - 100.0 (fL)   MCH 29.7  26.0 - 34.0 (pg)   MCHC 33.1  30.0 - 36.0 (g/dL)   RDW 78.2  95.6 - 21.3 (%)   Platelets 223  150 - 400 (K/uL)  DIFFERENTIAL      Component Value Range   Neutrophils Relative 56  43 - 77 (%)   Neutro Abs 2.9  1.7 - 7.7 (K/uL)   Lymphocytes Relative 33  12 - 46 (%)   Lymphs Abs 1.7  0.7 - 4.0 (K/uL)   Monocytes Relative 10  3 - 12 (%)   Monocytes Absolute 0.5  0.1 - 1.0 (K/uL)   Eosinophils Relative 2  0 - 5 (%)   Eosinophils Absolute 0.1  0.0 - 0.7 (K/uL)   Basophils Relative 0  0 - 1 (%)   Basophils Absolute 0.0  0.0 - 0.1 (K/uL)  COMPREHENSIVE METABOLIC PANEL      Component Value Range   Sodium 138  135 - 145 (mEq/L)   Potassium 3.1 (*) 3.5 - 5.1 (mEq/L)   Chloride 108  96 - 112 (mEq/L)   CO2 20  19 - 32 (mEq/L)   Glucose, Bld 102 (*) 70 - 99 (mg/dL)   BUN 11  6 - 23 (mg/dL)   Creatinine, Ser 0.86  0.50 - 1.10 (mg/dL)   Calcium 8.8  8.4 - 57.8 (mg/dL)   Total Protein 7.1  6.0 - 8.3 (g/dL)   Albumin 3.4 (*) 3.5 - 5.2 (g/dL)   AST 19  0 - 37 (U/L)   ALT 16  0 - 35 (U/L)   Alkaline Phosphatase 72  39 - 117 (U/L)   Total Bilirubin 0.5  0.3 - 1.2 (mg/dL)   GFR calc non Af Amer 78 (*) >90 (mL/min)   GFR calc Af Amer >90  >90 (mL/min)  LIPASE, BLOOD       Component Value Range   Lipase 26  11 - 59 (  U/L)  URINALYSIS, ROUTINE W REFLEX MICROSCOPIC      Component Value Range   Color, Urine AMBER (*) YELLOW    APPearance HAZY (*) CLEAR    Specific Gravity, Urine 1.028  1.005 - 1.030    pH 6.0  5.0 - 8.0    Glucose, UA NEGATIVE  NEGATIVE (mg/dL)   Hgb urine dipstick TRACE (*) NEGATIVE    Bilirubin Urine SMALL (*) NEGATIVE    Ketones, ur 15 (*) NEGATIVE (mg/dL)   Protein, ur NEGATIVE  NEGATIVE (mg/dL)   Urobilinogen, UA 1.0  0.0 - 1.0 (mg/dL)   Nitrite NEGATIVE  NEGATIVE    Leukocytes, UA NEGATIVE  NEGATIVE   D-DIMER, QUANTITATIVE      Component Value Range   D-Dimer, Quant 0.50 (*) 0.00 - 0.48 (ug/mL-FEU)  POCT I-STAT TROPONIN I      Component Value Range   Troponin i, poc 0.00  0.00 - 0.08 (ng/mL)   Comment 3           URINE MICROSCOPIC-ADD ON      Component Value Range   Squamous Epithelial / LPF MANY (*) RARE    WBC, UA 0-2  <3 (WBC/hpf)   RBC / HPF 3-6  <3 (RBC/hpf)   Bacteria, UA MANY (*) RARE    Urine-Other MUCOUS PRESENT     Dg Chest 2 View  12/12/2011  *RADIOLOGY REPORT*  Clinical Data: Left upper chest pain, shortness of breath.  CHEST - 2 VIEW  Comparison: None.  Findings: There are mildly accentuated bronchial markings.  There is an area of linear atelectasis versus scarring seen within the right middle lobe.  There are no infiltrative or edematous changes. The heart and mediastinal structures are normal.  IMPRESSION: Mildly accentuated bronchial markings and small area of linear scarring versus atelectasis within the right middle lobe. Otherwise, negative study.  Original Report Authenticated By: Rolla Plate, M.D.    MDM   11:56 AM Discuss patient with Dr. Judd Lien. D-dimer elevated mildly, 0.50, low suspicion for PE. Higher suspicion for coronary CP due to T wave inversion. Patient is otherwise low cardiac risk. Feel it would be more beneficial to place on the CDU CPP.   1:31 PM Patient now has active chest pain. Will  give Nitroglycerin and admit for chest pain rule out.  2:48 PM Spoke with Dr. Sunnie Nielsen, [facility] who will see the patient for admission   Thomasene Lot, Cordelia Poche 12/12/11 1448

## 2011-12-12 NOTE — ED Notes (Signed)
Introduced self to pt; placed on monitor, continuous pulse oximetry, blood pressure cuff and oxygen [place] (2L)

## 2011-12-13 ENCOUNTER — Encounter (HOSPITAL_COMMUNITY): Payer: Self-pay | Admitting: Physician Assistant

## 2011-12-13 DIAGNOSIS — F3189 Other bipolar disorder: Secondary | ICD-10-CM

## 2011-12-13 DIAGNOSIS — R079 Chest pain, unspecified: Secondary | ICD-10-CM

## 2011-12-13 LAB — BASIC METABOLIC PANEL
CO2: 19 mEq/L (ref 19–32)
Calcium: 8.4 mg/dL (ref 8.4–10.5)
GFR calc non Af Amer: 84 mL/min — ABNORMAL LOW (ref >90)
Glucose, Bld: 101 mg/dL — ABNORMAL HIGH (ref 70–99)
Potassium: 3.7 mEq/L (ref 3.5–5.1)
Sodium: 138 mEq/L (ref 135–145)

## 2011-12-13 LAB — HEMOGLOBIN A1C
Hgb A1c MFr Bld: 5.6 % (ref <5.7)
Mean Plasma Glucose: 114 mg/dL (ref <117)

## 2011-12-13 LAB — CARDIAC PANEL(CRET KIN+CKTOT+MB+TROPI)
CK, MB: 1.7 ng/mL (ref 0.3–4.0)
CK, MB: 1.7 ng/mL (ref 0.3–4.0)
Relative Index: 0.3 (ref 0.0–2.5)
Total CK: 336 U/L — ABNORMAL HIGH (ref 7–177)
Total CK: 491 U/L — ABNORMAL HIGH (ref 7–177)
Troponin I: <0.3 ng/mL (ref <0.30)

## 2011-12-13 LAB — CBC
Hemoglobin: 11.9 g/dL — ABNORMAL LOW (ref 12.0–15.0)
MCH: 30.1 pg (ref 26.0–34.0)
Platelets: 219 10*3/uL (ref 150–400)
RBC: 3.96 MIL/uL (ref 3.87–5.11)
WBC: 6.8 10*3/uL (ref 4.0–10.5)

## 2011-12-13 MED ORDER — ASPIRIN 81 MG PO CHEW
81.0000 mg | CHEWABLE_TABLET | Freq: Every day | ORAL | Status: DC
  Administered 2011-12-13 – 2011-12-16 (×4): 81 mg via ORAL
  Filled 2011-12-13 (×4): qty 1

## 2011-12-13 MED ORDER — DULOXETINE HCL 60 MG PO CPEP
60.0000 mg | ORAL_CAPSULE | Freq: Every day | ORAL | Status: DC
  Filled 2011-12-13: qty 1

## 2011-12-13 MED ORDER — PANTOPRAZOLE SODIUM 40 MG PO TBEC
40.0000 mg | DELAYED_RELEASE_TABLET | Freq: Two times a day (BID) | ORAL | Status: DC
  Administered 2011-12-13 – 2011-12-14 (×2): 40 mg via ORAL
  Filled 2011-12-13 (×2): qty 1

## 2011-12-13 MED ORDER — SODIUM CHLORIDE 0.9 % IV BOLUS (SEPSIS)
250.0000 mL | Freq: Once | INTRAVENOUS | Status: AC
  Administered 2011-12-13: 250 mL via INTRAVENOUS

## 2011-12-13 NOTE — Progress Notes (Signed)
  Echocardiogram 2D Echocardiogram has been performed.  Jorje Guild [facility] 12/13/2011, 11:34 AM

## 2011-12-13 NOTE — Consult Note (Signed)
Reason for Consult:Medication Management, Prolonged QTc interval  Referring Physician: Dr. Kirk Ruths is an 46 y.o. female.  HPI: Pt is reporting she has migraine headaches controlled by topomax, anxiety and Bipolar II diagnoses.  She says she had a manic episode and overspent a lot of money.  She had a decompensation response when her husband discovered the excessive spending.  She thought she had to have more medication and the doses of the SGA Abilify and Zyprexa were increased. She came to the ED with chest pain.   AXIS I   Bipolar II manic episode, most recent, severe AXIS II  Deferred AXIS III Past Medical History  Diagnosis Date  . Bipolar 2 disorder   . Migraines   . History of syncope 2002    Vasovagal  . Hyperlipidemia     Past Surgical History  Procedure Date  . Cesarean section     Family History  Problem Relation Age of Onset  . Dilated cardiomyopathy Mother     with ICD  . Lung cancer    . Breast cancer    . Diabetes type II Father   AXIS IV  Finances, family issues, medical problems AXIS V  GAF 50  Social History:  reports that she has never smoked. She does not have any smokeless tobacco history on file. She reports that she does not drink alcohol or use illicit drugs.  Allergies:  Allergies  Allergen Reactions  . Codeine Other (See Comments)    Hallucinations and fever.     Medications: I have reviewed the patient's current medications.   Results for orders placed during the hospital encounter of 12/12/11 (from the past 48 hour(s))  CBC     Status: Normal   Collection Time   12/12/11 10:02 AM      Component Value Range Comment   WBC 5.2  4.0 - 10.5 (K/uL)    RBC 4.45  3.87 - 5.11 (MIL/uL)    Hemoglobin 13.2  12.0 - 15.0 (g/dL)    HCT 16.1  09.6 - 04.5 (%)    MCV 89.7  78.0 - 100.0 (fL)    MCH 29.7  26.0 - 34.0 (pg)    MCHC 33.1  30.0 - 36.0 (g/dL)    RDW 40.9  81.1 - 91.4 (%)    Platelets 223  150 - 400 (K/uL)   DIFFERENTIAL      Status: Normal   Collection Time   12/12/11 10:02 AM      Component Value Range Comment   Neutrophils Relative 56  43 - 77 (%)    Neutro Abs 2.9  1.7 - 7.7 (K/uL)    Lymphocytes Relative 33  12 - 46 (%)    Lymphs Abs 1.7  0.7 - 4.0 (K/uL)    Monocytes Relative 10  3 - 12 (%)    Monocytes Absolute 0.5  0.1 - 1.0 (K/uL)    Eosinophils Relative 2  0 - 5 (%)    Eosinophils Absolute 0.1  0.0 - 0.7 (K/uL)    Basophils Relative 0  0 - 1 (%)    Basophils Absolute 0.0  0.0 - 0.1 (K/uL)   COMPREHENSIVE METABOLIC PANEL     Status: Abnormal   Collection Time   12/12/11 10:02 AM      Component Value Range Comment   Sodium 138  135 - 145 (mEq/L)    Potassium 3.1 (*) 3.5 - 5.1 (mEq/L)    Chloride 108  96 - 112 (  mEq/L)    CO2 20  19 - 32 (mEq/L)    Glucose, Bld 102 (*) 70 - 99 (mg/dL)    BUN 11  6 - 23 (mg/dL)    Creatinine, Ser 2.13  0.50 - 1.10 (mg/dL)    Calcium 8.8  8.4 - 10.5 (mg/dL)    Total Protein 7.1  6.0 - 8.3 (g/dL)    Albumin 3.4 (*) 3.5 - 5.2 (g/dL)    AST 19  0 - 37 (U/L)    ALT 16  0 - 35 (U/L)    Alkaline Phosphatase 72  39 - 117 (U/L)    Total Bilirubin 0.5  0.3 - 1.2 (mg/dL)    GFR calc non Af Amer 78 (*) >90 (mL/min)    GFR calc Af Amer >90  >90 (mL/min)   LIPASE, BLOOD     Status: Normal   Collection Time   12/12/11 10:02 AM      Component Value Range Comment   Lipase 26  11 - 59 (U/L)   D-DIMER, QUANTITATIVE     Status: Abnormal   Collection Time   12/12/11 10:02 AM      Component Value Range Comment   D-Dimer, Quant 0.50 (*) 0.00 - 0.48 (ug/mL-FEU)   POCT I-STAT TROPONIN I     Status: Normal   Collection Time   12/12/11 10:16 AM      Component Value Range Comment   Troponin i, poc 0.00  0.00 - 0.08 (ng/mL)    Comment 3            URINALYSIS, ROUTINE W REFLEX MICROSCOPIC     Status: Abnormal   Collection Time   12/12/11 10:29 AM      Component Value Range Comment   Color, Urine AMBER (*) YELLOW  BIOCHEMICALS MAY BE AFFECTED BY COLOR   APPearance HAZY (*) CLEAR      Specific Gravity, Urine 1.028  1.005 - 1.030     pH 6.0  5.0 - 8.0     Glucose, UA NEGATIVE  NEGATIVE (mg/dL)    Hgb urine dipstick TRACE (*) NEGATIVE     Bilirubin Urine SMALL (*) NEGATIVE     Ketones, ur 15 (*) NEGATIVE (mg/dL)    Protein, ur NEGATIVE  NEGATIVE (mg/dL)    Urobilinogen, UA 1.0  0.0 - 1.0 (mg/dL)    Nitrite NEGATIVE  NEGATIVE     Leukocytes, UA NEGATIVE  NEGATIVE    URINE MICROSCOPIC-ADD ON     Status: Abnormal   Collection Time   12/12/11 10:29 AM      Component Value Range Comment   Squamous Epithelial / LPF MANY (*) RARE     WBC, UA 0-2  <3 (WBC/hpf)    RBC / HPF 3-6  <3 (RBC/hpf)    Bacteria, UA MANY (*) RARE     Urine-Other MUCOUS PRESENT   FEW YEAST  POCT I-STAT TROPONIN I     Status: Normal   Collection Time   12/12/11  1:09 PM      Component Value Range Comment   Troponin i, poc 0.00  0.00 - 0.08 (ng/mL)    Comment 3            HEMOGLOBIN A1C     Status: Normal   Collection Time   12/12/11  5:49 PM      Component Value Range Comment   Hemoglobin A1C 5.6  <5.7 (%)    Mean Plasma Glucose 114  <117 (mg/dL)   MRSA  PCR SCREENING     Status: Normal   Collection Time   12/12/11  7:24 PM      Component Value Range Comment   MRSA by PCR NEGATIVE  NEGATIVE    TSH     Status: Normal   Collection Time   12/12/11  7:40 PM      Component Value Range Comment   TSH 2.790  0.350 - 4.500 (uIU/mL)   LIPID PANEL     Status: Abnormal   Collection Time   12/12/11  7:41 PM      Component Value Range Comment   Cholesterol 182  0 - 200 (mg/dL)    Triglycerides 478  <150 (mg/dL)    HDL 52  >29 (mg/dL)    Total CHOL/HDL Ratio 3.5      VLDL 21  0 - 40 (mg/dL)    LDL Cholesterol 562 (*) 0 - 99 (mg/dL)   HEPATIC FUNCTION PANEL     Status: Abnormal   Collection Time   12/12/11  7:41 PM      Component Value Range Comment   Total Protein 6.5  6.0 - 8.3 (g/dL)    Albumin 3.1 (*) 3.5 - 5.2 (g/dL)    AST 18  0 - 37 (U/L)    ALT 15  0 - 35 (U/L)    Alkaline Phosphatase 64  39 - 117  (U/L)    Total Bilirubin 0.5  0.3 - 1.2 (mg/dL)    Bilirubin, Direct <1.3  0.0 - 0.3 (mg/dL)    Indirect Bilirubin NOT CALCULATED  0.3 - 0.9 (mg/dL)   CARDIAC PANEL(CRET KIN+CKTOT+MB+TROPI)     Status: Abnormal   Collection Time   12/12/11  7:47 PM      Component Value Range Comment   Total CK 239 (*) 7 - 177 (U/L)    CK, MB 1.6  0.3 - 4.0 (ng/mL)    Troponin I <0.30  <0.30 (ng/mL)    Relative Index 0.7  0.0 - 2.5    CARDIAC PANEL(CRET KIN+CKTOT+MB+TROPI)     Status: Abnormal   Collection Time   12/13/11  2:36 AM      Component Value Range Comment   Total CK 336 (*) 7 - 177 (U/L)    CK, MB 1.7  0.3 - 4.0 (ng/mL)    Troponin I <0.30  <0.30 (ng/mL)    Relative Index 0.5  0.0 - 2.5    BASIC METABOLIC PANEL     Status: Abnormal   Collection Time   12/13/11  2:54 AM      Component Value Range Comment   Sodium 138  135 - 145 (mEq/L)    Potassium 3.7  3.5 - 5.1 (mEq/L)    Chloride 112  96 - 112 (mEq/L)    CO2 19  19 - 32 (mEq/L)    Glucose, Bld 101 (*) 70 - 99 (mg/dL)    BUN 12  6 - 23 (mg/dL)    Creatinine, Ser 0.86  0.50 - 1.10 (mg/dL)    Calcium 8.4  8.4 - 10.5 (mg/dL)    GFR calc non Af Amer 84 (*) >90 (mL/min)    GFR calc Af Amer >90  >90 (mL/min)   CBC     Status: Abnormal   Collection Time   12/13/11  2:54 AM      Component Value Range Comment   WBC 6.8  4.0 - 10.5 (K/uL)    RBC 3.96  3.87 - 5.11 (MIL/uL)    Hemoglobin  11.9 (*) 12.0 - 15.0 (g/dL)    HCT 40.9 (*) 81.1 - 46.0 (%)    MCV 90.7  78.0 - 100.0 (fL)    MCH 30.1  26.0 - 34.0 (pg)    MCHC 33.1  30.0 - 36.0 (g/dL)    RDW 91.4  78.2 - 95.6 (%)    Platelets 219  150 - 400 (K/uL)   CARDIAC PANEL(CRET KIN+CKTOT+MB+TROPI)     Status: Abnormal   Collection Time   12/13/11 12:16 PM      Component Value Range Comment   Total CK 491 (*) 7 - 177 (U/L)    CK, MB 1.7  0.3 - 4.0 (ng/mL)    Troponin I <0.30  <0.30 (ng/mL)    Relative Index 0.3  0.0 - 2.5    MAGNESIUM     Status: Normal   Collection Time   12/13/11 12:17 PM       Component Value Range Comment   Magnesium 2.1  1.5 - 2.5 (mg/dL)     Dg Chest 2 View  12/08/3084  *RADIOLOGY REPORT*  Clinical Data: Left upper chest pain, shortness of breath.  CHEST - 2 VIEW  Comparison: None.  Findings: There are mildly accentuated bronchial markings.  There is an area of linear atelectasis versus scarring seen within the right middle lobe.  There are no infiltrative or edematous changes. The heart and mediastinal structures are normal.  IMPRESSION: Mildly accentuated bronchial markings and small area of linear scarring versus atelectasis within the right middle lobe. Otherwise, negative study.  Original Report Authenticated By: Rolla Plate, M.D.   Ct Angio Chest W/cm &/or Wo Cm  12/12/2011  *RADIOLOGY REPORT*  Clinical Data: Left-sided chest pain with radiation to left shoulder  CT ANGIOGRAPHY CHEST  Technique:  Multidetector CT imaging of the chest using the standard protocol during bolus administration of intravenous contrast. Multiplanar reconstructed images including MIPs were obtained and reviewed to evaluate the vascular anatomy.  Contrast: 70mL OMNIPAQUE IOHEXOL 300 MG/ML IV SOLN  Comparison: Chest radiograph of earlier same day  Findings:  There is adequate opacification of the pulmonary arteries and main pulmonary artery measuring 317 HU).  There are no discrete filling defects within pulmonary arteries to suggest acute pulmonary embolism.  Normal caliber of the main pulmonary artery.  The thoracic aorta is of normal caliber.  No definite periaortic stranding.  Normal configuration of the aortic arch.  Visualized portions of the cervical vasculature appear patent.  Normal heart size.  No pericardial effusion.  No hilar, mediastinal or axillary lymphadenopathy.  There is minimal symmetric dependent ground-glass opacities favored to represent atelectasis.  Linear opacities in the medial aspect of the lingula and right middle lobe also favored to represent plate- like  atelectasis.  No focal airspace opacities.  There is minimal central m r face enemas change, most conspicuous in the left upper lobe.  No discrete pulmonary nodules.  Central airways are patent. No pneumothorax or pleural effusion.  Limited early arterial phase evaluation of the upper abdomen is unremarkable.  The thyroid appears heterogeneous without discrete nodule.  No acute or aggressive osseous abnormalities. There is nonspecific mild diffuse increased sclerosis of the imaged osseous structures.  IMPRESSION:  1.  No acute findings within the chest.  Specifically, no evidence of pulmonary embolism.  2. Nonspecific mild diffuse increase sclerosis of the imaged osseous structures may suggest an underlying metabolic disorder. Clinical correlation is advised.  Original Report Authenticated By: Judene Companion.D.  Review of Systems  Unable to perform ROS: other   Blood pressure 104/45, pulse 76, temperature 97.9 F (36.6 C), temperature source Oral, resp. rate 21, height 5\' 2"  (1.575 m), weight 93.441 kg (206 lb), last menstrual period 11/18/2011, SpO2 97.00%. Physical Exam  Assessment/Plan:  Chart is reviewed, Discussed with RN Pt is awake, alert and oriented.  She is animated in affect and has spontaneous speech.  She is cognitively intact and says she was seeing her PCP for her medications because she felt there was stigma to see a psychiatrist.  She says she has more depression than manic episodes.  She says her mania never lasts with sleepless nights more than 3 nights.  She recalls her mania specifically.  Her home meds included 2 SGAs, 2 benzodiazepines and an SNRI,, Lamictal  and anticonvulsant Topomax.  RECOMMENDATION: 1. It is believed Zyprexa is more likely than Abilify to influence QTc, Cymbalta [with norepinephrine effect] might need to be replaced with an SSRI e.g. Lexapro [active metabolite from Celexa]  Lamictal is to prolong recurrent depression and is not critical now.   2.  Consider PharmD review, expecially to rule out any effect on QTc from Topomax.  Other medications can control migraine headaches.  3. Watch for any Extrapyramidal effects [EPS] of dystonia or akathisia with sudden discontinuation of the SGAs.  Give Cogentin 1-2 mg daily  to stop EPS  4. Limit use of Klonopin.  DC Xanax, taper Ativan. 4. When medically stable, refer to psychiatrist for outpatient folllow up.  5. Will follow this patient.    Momin Misko 12/13/2011, 10:58 PM

## 2011-12-13 NOTE — ED Provider Notes (Signed)
Medical screening examination/treatment/procedure(s) were conducted as a shared visit with non-physician practitioner(s) and myself.  I personally evaluated the patient during the encounter.  I saw the patient along with B. Cyndie Chime.  I agree with her note, assessment, and plan.  The patient presents with somewhat atypical chest pain and negative workup.  The heart and lung exam were within normal limits and there was no ttp of the chest wall.  We had originally planned to place the patient in the CDU under our chest pain protocol, however she began to experience pain again.  We then contacted medicine for admission.  Geoffery Lyons, MD 12/13/11 [phone]

## 2011-12-13 NOTE — Consult Note (Signed)
Cardiology Consult Note   Patient ID: KESI PERROW MRN: [id number], DOB/AGE: 05-13-1966   Admit date: 12/12/2011 Date of Consult: 12/13/2011  Primary Physician: No primary provider on file. Primary Cardiologist: None  Pt. Profile: Alexandra Stein is a 46 yo Caucasian female with no significant cardiac history or prior cardiac work-up, PMHx significant for bipolar I disorder, history of vasovagal syncope (negative tilt table testing in 2002), hyperlipidemia (LDL 107 on lipid panel today) and obesity who was admitted to [facility] with chest pain.   Reason for consult: evaluation of chest pain   Problem List: Past Medical History  Diagnosis Date  . Bipolar 1 disorder, depressed   . Migraines     Past Surgical History  Procedure Date  . Cesarean section     Allergies:  Allergies  Allergen Reactions  . Codeine Other (See Comments)    Hallucinations and fever.    HPI:   She reports experiencing intermittent sharp, substernal, chest pain waking her from sleep around 6-8am lasting 15 minutes rated at a 8/10. She endorsed associated SOB, nausea and dizziness. The pain was relieved suddenly. She called her sister-in-law, who is a Engineer, civil (consulting). She administered ASA and kept an eye on her. The pain did not recur. She denies decreased activity level, palpitations, DOE, orthopnea, PND, LE edema, fevers, chills and n/v. Denies history of asthma, heart burn, indigestion, denies sick contacts and extended travel. She denies hypertension, hyperlipidemia She does not recall instigating event, prior to experiencing her chest pain. Of note, she does endorse color changes to fingers with associated pain upon exposure to cold temperatures.   She reports a similar episode at work yesterday, while standing at a counter, more severe in nature, almost causing her to "blackout." She then drove to the [facility] ED. While in the ED POC TnI was neg. EKG revealed nonspecific ST-T wave changes V3-V6, however QT prolongation was present.  CXR revealed no acute cardiopulmonary process. D-dimer was mildly elevated at 0.5, CT angiogram of the chest was negative for PE. TSH was within normal limits.  Inpatient Medications:     . aspirin  81 mg Oral Daily  . DULoxetine  60 mg Oral QHS  . pantoprazole (PROTONIX) IV  40 mg Intravenous Q12H  . potassium chloride  40 mEq Oral Once  . sodium chloride  250 mL Intravenous Once  . sodium chloride  500 mL Intravenous Once  . DISCONTD: sodium chloride   Intravenous STAT  . DISCONTD: ARIPiprazole  10 mg Oral Daily  . DISCONTD: DULoxetine  60 mg Oral Daily  . DISCONTD: LamoTRIgine  1 tablet Oral QPM  . DISCONTD: LamoTRIgine  1 tablet Oral QPM  . DISCONTD: LamoTRIgine  200 mg Oral Daily  . DISCONTD: LamoTRIgine  200 mg Oral QHS  . DISCONTD: OLANZapine  5 mg Oral QHS  . DISCONTD: topiramate  100 mg Oral QHS   Prescriptions prior to admission  Medication Sig Dispense Refill  . ARIPiprazole (ABILIFY) 5 MG tablet Take 10 mg by mouth daily.       . DULoxetine (CYMBALTA) 60 MG capsule Take 60 mg by mouth daily.       . LamoTRIgine (LAMICTAL XR) 200 MG TB24 Take 1 tablet by mouth every evening.       . naproxen sodium (ANAPROX) 220 MG tablet Take 220 mg by mouth 2 (two) times daily as needed. For pain      . OLANZapine (ZYPREXA) 5 MG tablet Take 5 mg by mouth at bedtime.       Marland Kitchen  promethazine (PHENERGAN) 25 MG tablet Take 25 mg by mouth every 6 (six) hours as needed. For nausea      . topiramate (TOPAMAX) 100 MG tablet Take 100 mg by mouth at bedtime.        History reviewed. No pertinent family history.   History   Social History  . Marital Status: Married    Spouse Name: N/A    Number of Children: N/A  . Years of Education: N/A   Occupational History  . Not on file.   Social History Main Topics  . Smoking status: Never Smoker   . Smokeless tobacco: Not on file  . Alcohol Use: No  . Drug Use: No  . Sexually Active:    Other Topics Concern  . Not on file   Social History  Narrative  . No narrative on file    Review of Systems: General: negative for chills, fever, night sweats or weight changes.  Cardiovascular: positive for chest pain, shortness of breath, negative for dyspnea on exertion, edema, orthopnea, palpitations, paroxysmal nocturnal dyspnea  Dermatological: negative for rash Respiratory: negative for cough or wheezing Urologic: negative for hematuria Abdominal: positive for nausea, negative for vomiting, diarrhea, bright red blood per rectum, melena, or hematemesis Neurologic: negative for visual changes, syncope, or dizziness All other systems reviewed and are otherwise negative except as noted above.  Physical Exam: Blood pressure 94/52, pulse 76, temperature 98.4 F (36.9 C), temperature source Oral, resp. rate 21, height 5\' 2"  (1.575 m), weight 93.441 kg (206 lb), last menstrual period 11/18/2011, SpO2 97.00%.   General: Well developed, well nourished, in NAD Head: Normocephalic, atraumatic, sclera non-icteric, no xanthomas  Neck: Negative for carotid bruits. JVD not elevated. Lungs: Clear bilaterally to auscultation without wheezes, rales, or rhonchi. Breathing is unlabored. Heart: RRR with S1 S2. No murmurs, rubs, or gallops appreciated. Abdomen: Soft, non-tender, non-distended with normoactive bowel sounds. No hepatomegaly. No rebound/guarding. No obvious abdominal masses. Msk:  No chest Shaunee Mulkern tenderness. Strength and tone appears normal for age. Extremities: No clubbing, cyanosis or edema.  Distal pedal pulses are 2+ and equal bilaterally. Neuro: Alert and oriented X 3. Moves all extremities spontaneously. Psych:  Responds to questions appropriately with a normal affect.  Labs: Recent Labs  Basename 12/13/11 0254 12/12/11 1002   WBC 6.8 5.2   HGB 11.9* 13.2   HCT 35.9* 39.9   MCV 90.7 89.7   PLT 219 223   Recent Labs  Basename 12/12/11 1002   DDIMER 0.50*    Lab 12/13/11 0254 12/12/11 1941 12/12/11 1002  NA 138 -- 138  K 3.7  -- 3.1*  CL 112 -- 108  CO2 19 -- 20  BUN 12 -- 11  CREATININE 0.83 -- 0.88  CALCIUM 8.4 -- 8.8  PROT -- 6.5 --  BILITOT -- 0.5 --  ALKPHOS -- 64 --  ALT -- 15 --  AST -- 18 --  AMYLASE -- -- --  LIPASE -- -- 26  GLUCOSE 101* -- 102*   Recent Labs  Basename 12/12/11 1749   HGBA1C 5.6   Recent Labs  Basename 12/13/11 0236 12/12/11 1947   CKTOTAL 336* 239*   CKMB 1.7 1.6   CKMBINDEX -- --   TROPONINI <0.30 <0.30   No components found with this basename: POCBNP Recent Labs  Basename 12/12/11 1941   CHOL 182   HDL 52   LDLCALC 109*   TRIG 107   CHOLHDL 3.5   LDLDIRECT --    [facility] 12/12/11  1940  TSH 2.790  T4TOTAL --  T3FREE --  THYROIDAB --    Radiology/Studies: Dg Chest 2 View  12/12/2011  *RADIOLOGY REPORT*  Clinical Data: Left upper chest pain, shortness of breath.  CHEST - 2 VIEW  Comparison: None.  Findings: There are mildly accentuated bronchial markings.  There is an area of linear atelectasis versus scarring seen within the right middle lobe.  There are no infiltrative or edematous changes. The heart and mediastinal structures are normal.  IMPRESSION: Mildly accentuated bronchial markings and small area of linear scarring versus atelectasis within the right middle lobe. Otherwise, negative study.  Original Report Authenticated By: Rolla Plate, M.D.   Ct Angio Chest W/cm &/or Wo Cm  12/12/2011  *RADIOLOGY REPORT*  Clinical Data: Left-sided chest pain with radiation to left shoulder  CT ANGIOGRAPHY CHEST  Technique:  Multidetector CT imaging of the chest using the standard protocol during bolus administration of intravenous contrast. Multiplanar reconstructed images including MIPs were obtained and reviewed to evaluate the vascular anatomy.  Contrast: 70mL OMNIPAQUE IOHEXOL 300 MG/ML IV SOLN  Comparison: Chest radiograph of earlier same day  Findings:  There is adequate opacification of the pulmonary arteries and main pulmonary artery measuring 317 HU).  There  are no discrete filling defects within pulmonary arteries to suggest acute pulmonary embolism.  Normal caliber of the main pulmonary artery.  The thoracic aorta is of normal caliber.  No definite periaortic stranding.  Normal configuration of the aortic arch.  Visualized portions of the cervical vasculature appear patent.  Normal heart size.  No pericardial effusion.  No hilar, mediastinal or axillary lymphadenopathy.  There is minimal symmetric dependent ground-glass opacities favored to represent atelectasis.  Linear opacities in the medial aspect of the lingula and right middle lobe also favored to represent plate- like atelectasis.  No focal airspace opacities.  There is minimal central m r face enemas change, most conspicuous in the left upper lobe.  No discrete pulmonary nodules.  Central airways are patent. No pneumothorax or pleural effusion.  Limited early arterial phase evaluation of the upper abdomen is unremarkable.  The thyroid appears heterogeneous without discrete nodule.  No acute or aggressive osseous abnormalities. There is nonspecific mild diffuse increased sclerosis of the imaged osseous structures.  IMPRESSION:  1.  No acute findings within the chest.  Specifically, no evidence of pulmonary embolism.  2. Nonspecific mild diffuse increase sclerosis of the imaged osseous structures may suggest an underlying metabolic disorder. Clinical correlation is advised.  Original Report Authenticated By: Waynard Reeds, M.D.   EKG: NSR, 66 bpm, prolonged QTc, no ST-T wave changes  ASSESSMENT AND PLAN:   1. Chest pain- atypical for cardiac ischemia. Pain was sharp lasting only 15 minutes at a time. She has had 3 such episodes, one since being in the hospital. This was relieved with NTG. Presyncope may have been secondary to vasovagal hypotension. She is young with relatively few cardiac risk factors in HL and obesity. Objectively, EKG revealed nonspecific ST-T changes V3-V6, and has been unremarkable  for ischemic changes today. Cardiac biomarkers have been negative x3. Total CK was initially mildly elevated at 239, and has trended up to 491. This may represent a mild myopathy and support a musculoskeletal etiology. NSR on telemetry. Echo has been ordered and is pending. Additionally, she has a history of migraines and endorses color changes to her fingers upon exposure to cold temperatures. Because she already has a history of vascular hypersensitivity evidenced by vasovagal syncope, and  nitroglycerin seemed to provide relief, may also include vasospasm in the differential.   - Will review echo results  - Patient is relatively low-risk, however, given dynamic EKG changes and active chest pain relieved with NTG, will order inpatient Myoview    - Would hold off on CCBs or long-acting nitrates given hypotension   2. Prolonged QT- likely secondary to multiple psychiatric meds for bipolar 2 disorder  - Continue to hold, these medications may need to be adjusted   3. Hypotension- patient is on multiple medications for bipolar disorder which can potentially cause hypotension. These medications have been discontinued.  - Continue to monitor   Signed, R. Hurman Horn, PA-C 12/13/2011, 1:09 PM    I have taken a history, reviewed medications, allergies, PMH, SH, FH, and reviewed ROS and examined the patient.  I agree with the assessment and plan as discussed with Alexandra Stein PAC. Discussed with patient who understands and agree with the plan. Would only use prn SL NTG with soft BP.  Tishia Maestre C. Daleen Squibb, MD, [facility] Pager:  [phone]

## 2011-12-13 NOTE — Progress Notes (Signed)
Subjective: Patient had another episode today earlier.   Objective: Filed Vitals:   12/12/11 1900 12/13/11 0013 12/13/11 0400 12/13/11 0800  BP: 104/60 91/49 91/48  94/52  Pulse: 61 66 76   Temp: 97.7 F (36.5 C) 97.7 F (36.5 C) 97.6 F (36.4 C) 97.4 F (36.3 C)  TempSrc: Oral Oral Oral Oral  Resp: 17 20 21    Height:      Weight:      SpO2: 98% 98% 97%    Weight change:   Intake/Output Summary (Last 24 hours) at 12/13/11 1119 Last data filed at 12/13/11 0800  Gross per 24 hour  Intake    240 ml  Output      0 ml  Net    240 ml    General: Alert, awake, oriented x3, in no acute distress.  HEENT: No bruits, no goiter.  Heart: Regular rate and rhythm, without murmurs, rubs, gallops.  Lungs: CTA, bilateral air movement.  Abdomen: Soft, nontender, nondistended, positive bowel sounds.  Neuro: Grossly intact, nonfocal. Extremities; no edema.   Lab Results:  Basename 12/13/11 0254 12/12/11 1002  NA 138 138  K 3.7 3.1*  CL 112 108  CO2 19 20  GLUCOSE 101* 102*  BUN 12 11  CREATININE 0.83 0.88  CALCIUM 8.4 8.8  MG -- --  PHOS -- --    Basename 12/12/11 1941 12/12/11 1002  AST 18 19  ALT 15 16  ALKPHOS 64 72  BILITOT 0.5 0.5  PROT 6.5 7.1  ALBUMIN 3.1* 3.4*    Basename 12/12/11 1002  LIPASE 26  AMYLASE --    Basename 12/13/11 0254 12/12/11 1002  WBC 6.8 5.2  NEUTROABS -- 2.9  HGB 11.9* 13.2  HCT 35.9* 39.9  MCV 90.7 89.7  PLT 219 223    Basename 12/13/11 0236 12/12/11 1947  CKTOTAL 336* 239*  CKMB 1.7 1.6  CKMBINDEX -- --  TROPONINI <0.30 <0.30   No components found with this basename: POCBNP:3  Basename 12/12/11 1002  DDIMER 0.50*    Basename 12/12/11 1749  HGBA1C 5.6    Basename 12/12/11 1941  CHOL 182  HDL 52  LDLCALC 109*  TRIG 107  CHOLHDL 3.5  LDLDIRECT --    Basename 12/12/11 1940  TSH 2.790  T4TOTAL --  T3FREE --  THYROIDAB --   No results found for this basename:  VITAMINB12:2,FOLATE:2,FERRITIN:2,TIBC:2,IRON:2,RETICCTPCT:2 in the last 72 hours  Micro Results: Recent Results (from the past 240 hour(s))  MRSA PCR SCREENING     Status: Normal   Collection Time   12/12/11  7:24 PM      Component Value Range Status Comment   MRSA by PCR NEGATIVE  NEGATIVE  Final     Studies/Results: Dg Chest 2 View  12/12/2011  *RADIOLOGY REPORT*  Clinical Data: Left upper chest pain, shortness of breath.  CHEST - 2 VIEW  Comparison: None.  Findings: There are mildly accentuated bronchial markings.  There is an area of linear atelectasis versus scarring seen within the right middle lobe.  There are no infiltrative or edematous changes. The heart and mediastinal structures are normal.  IMPRESSION: Mildly accentuated bronchial markings and small area of linear scarring versus atelectasis within the right middle lobe. Otherwise, negative study.  Original Report Authenticated By: Rolla Plate, M.D.   Ct Angio Chest W/cm &/or Wo Cm  12/12/2011  *RADIOLOGY REPORT*  Clinical Data: Left-sided chest pain with radiation to left shoulder  CT ANGIOGRAPHY CHEST  Technique:  Multidetector CT imaging of the  chest using the standard protocol during bolus administration of intravenous contrast. Multiplanar reconstructed images including MIPs were obtained and reviewed to evaluate the vascular anatomy.  Contrast: 70mL OMNIPAQUE IOHEXOL 300 MG/ML IV SOLN  Comparison: Chest radiograph of earlier same day  Findings:  There is adequate opacification of the pulmonary arteries and main pulmonary artery measuring 317 HU).  There are no discrete filling defects within pulmonary arteries to suggest acute pulmonary embolism.  Normal caliber of the main pulmonary artery.  The thoracic aorta is of normal caliber.  No definite periaortic stranding.  Normal configuration of the aortic arch.  Visualized portions of the cervical vasculature appear patent.  Normal heart size.  No pericardial effusion.  No hilar,  mediastinal or axillary lymphadenopathy.  There is minimal symmetric dependent ground-glass opacities favored to represent atelectasis.  Linear opacities in the medial aspect of the lingula and right middle lobe also favored to represent plate- like atelectasis.  No focal airspace opacities.  There is minimal central m r face enemas change, most conspicuous in the left upper lobe.  No discrete pulmonary nodules.  Central airways are patent. No pneumothorax or pleural effusion.  Limited early arterial phase evaluation of the upper abdomen is unremarkable.  The thyroid appears heterogeneous without discrete nodule.  No acute or aggressive osseous abnormalities. There is nonspecific mild diffuse increased sclerosis of the imaged osseous structures.  IMPRESSION:  1.  No acute findings within the chest.  Specifically, no evidence of pulmonary embolism.  2. Nonspecific mild diffuse increase sclerosis of the imaged osseous structures may suggest an underlying metabolic disorder. Clinical correlation is advised.  Original Report Authenticated By: Waynard Reeds, M.D.    Medications: I have reviewed the patient's current medications.   Patient Active Hospital Problem List:  Chest pain (12/12/2011) Troponin times 2 negative, ECHO pending, CT angio negative. HB A1c normal, TSH normal, Lipid panel normal, EKG no st changes,  Aspirin daily. She had another episode this morning no as severe.  Unclear etiology, doesn't sound Gi in origin, no relate with food, maybe relate to medication ? Hypotension ? I will ask cardio to see patient, patient still with chest pain.    Hypotension (12/12/2011) Patient received Zyprexa last night. BP in the high 90 today. Continue with IV fluids. I will check cortisol level in am. She has some dizziness. I will stop again Zyprexa, Abilify , lamotrigine, they can prolong Qt and cause hypotension. Patient received this medications yesterday.  Bipolar: Patient had increase dose of her  medications. I will consult psych to help with medications management , patient with prolong QT and hypotension. Continue with Cymbalta.  Prolong Qt: probably secondary to medication. I will check Mg level.    LOS: 1 day   Marieelena Bartko M.D.  [facility] 12/13/2011, 11:19 AM

## 2011-12-14 ENCOUNTER — Inpatient Hospital Stay (HOSPITAL_COMMUNITY): Payer: BC Managed Care – PPO

## 2011-12-14 DIAGNOSIS — I959 Hypotension, unspecified: Secondary | ICD-10-CM

## 2011-12-14 DIAGNOSIS — R079 Chest pain, unspecified: Secondary | ICD-10-CM

## 2011-12-14 LAB — BASIC METABOLIC PANEL
Calcium: 8.6 mg/dL (ref 8.4–10.5)
GFR calc Af Amer: >90 mL/min (ref >90)
GFR calc non Af Amer: >90 mL/min (ref >90)
Glucose, Bld: 105 mg/dL — ABNORMAL HIGH (ref 70–99)
Potassium: 3.5 mEq/L (ref 3.5–5.1)
Sodium: 142 mEq/L (ref 135–145)

## 2011-12-14 LAB — CORTISOL-AM, BLOOD: Cortisol - AM: 3.8 ug/dL — ABNORMAL LOW (ref 4.3–22.4)

## 2011-12-14 MED ORDER — CLONAZEPAM 0.5 MG PO TABS
0.5000 mg | ORAL_TABLET | Freq: Two times a day (BID) | ORAL | Status: DC | PRN
  Administered 2011-12-14: 0.5 mg via ORAL
  Filled 2011-12-14: qty 1

## 2011-12-14 MED ORDER — ARIPIPRAZOLE 10 MG PO TABS
10.0000 mg | ORAL_TABLET | Freq: Every day | ORAL | Status: DC
  Administered 2011-12-14 – 2011-12-16 (×3): 10 mg via ORAL
  Filled 2011-12-14 (×3): qty 1

## 2011-12-14 MED ORDER — TECHNETIUM TC 99M TETROFOSMIN IV KIT
10.0000 | PACK | Freq: Once | INTRAVENOUS | Status: AC | PRN
Start: 2011-12-14 — End: 2011-12-14
  Administered 2011-12-14: 10 via INTRAVENOUS

## 2011-12-14 MED ORDER — ESCITALOPRAM OXALATE 10 MG PO TABS
10.0000 mg | ORAL_TABLET | Freq: Every day | ORAL | Status: DC
  Administered 2011-12-14 – 2011-12-15 (×2): 10 mg via ORAL
  Filled 2011-12-14 (×2): qty 1

## 2011-12-14 MED ORDER — TOPIRAMATE 100 MG PO TABS
100.0000 mg | ORAL_TABLET | Freq: Every day | ORAL | Status: DC
  Administered 2011-12-14 – 2011-12-15 (×2): 100 mg via ORAL
  Filled 2011-12-14 (×4): qty 1

## 2011-12-14 MED ORDER — TECHNETIUM TC 99M TETROFOSMIN IV KIT
30.0000 | PACK | Freq: Once | INTRAVENOUS | Status: AC | PRN
  Administered 2011-12-14: 30 via INTRAVENOUS

## 2011-12-14 NOTE — Progress Notes (Signed)
Pt report called to [id number] RN, VSS, all meds given up to current time.

## 2011-12-14 NOTE — Progress Notes (Signed)
Clinical Social Work attempted to see patient this am to follow up from Psych Assessment along with setting up an outpatient appointment for continue care and medication management.  Patient currently out of room due to having a Stress Test done.  Will follow up with patient after test around lunch time.  Family in room, introduced self and will return.  Ashley Jacobs, MSW LCSW [phone]

## 2011-12-14 NOTE — Progress Notes (Signed)
Pt c/o of mild nausea--she states that this happens when medications are adjusted.  She thinks it is due to not taking zyprexa for 2 days.  Pt given PRN zofran per orders.  She would like to discuss medication adjustments with MD in am. Dierdre Highman

## 2011-12-14 NOTE — Progress Notes (Signed)
[facility] [place] TEAM 8  Subjective: 46 year old female with past medical history significant for bipolar who presented to the emergency department complaining of chest pain.  She reports that her pain has resolved.  She remains quite anxious about resuming her bipolar meds, as she is afraid she will suffer an acute exacerbation of her psychiatric symptoms.  She denies n/v, rigidity, stiffness, neck ache, or ha.    Objective: Weight change:   Intake/Output Summary (Last 24 hours) at 12/14/11 1448 Last data filed at 12/14/11 0701  Gross per 24 hour  Intake 4762.08 ml  Output      0 ml  Net 4762.08 ml   Blood pressure 144/63, pulse 99, temperature 97.5 F (36.4 C), temperature source Oral, resp. rate 21, height 5\' 2"  (1.575 m), weight 93.441 kg (206 lb), last menstrual period 11/18/2011, SpO2 97.00%.  Physical Exam: General: No acute respiratory distress Lungs: Clear to auscultation bilaterally without wheezes or crackles Cardiovascular: Regular rate and rhythm without murmur gallop or rub normal S1 and S2 Abdomen: Nontender, nondistended, soft, bowel sounds positive, no rebound, no ascites, no appreciable mass Extremities: No significant cyanosis, clubbing, or edema bilateral lower extremities  Lab Results:  Basename 12/14/11 0450 12/13/11 1217 12/13/11 0254 12/12/11 1002  NA 142 -- 138 138  K 3.5 -- 3.7 3.1*  CL 116* -- 112 108  CO2 21 -- 19 20  GLUCOSE 105* -- 101* 102*  BUN 9 -- 12 11  CREATININE 0.79 -- 0.83 0.88  CALCIUM 8.6 -- 8.4 8.8  MG -- 2.1 -- --  PHOS -- -- -- --    Basename 12/12/11 1941 12/12/11 1002  AST 18 19  ALT 15 16  ALKPHOS 64 72  BILITOT 0.5 0.5  PROT 6.5 7.1  ALBUMIN 3.1* 3.4*    Basename 12/13/11 0254 12/12/11 1002  WBC 6.8 5.2  NEUTROABS -- 2.9  HGB 11.9* 13.2  HCT 35.9* 39.9  MCV 90.7 89.7  PLT 219 223    Basename 12/13/11 1216 12/13/11 0236 12/12/11 1947  CKTOTAL 491* 336* 239*  CKMB 1.7 1.7 1.6  CKMBINDEX -- -- --    TROPONINI <0.30 <0.30 <0.30    Basename 12/12/11 1749  HGBA1C 5.6    Micro Results: Recent Results (from the past 240 hour(s))  MRSA PCR SCREENING     Status: Normal   Collection Time   12/12/11  7:24 PM      Component Value Range Status Comment   MRSA by PCR NEGATIVE  NEGATIVE  Final     Studies/Results: All recent x-ray/radiology reports have been reviewed in detail.   Medications: I have reviewed the patient's complete medication list.  Assessment/Plan:  Bipolar d/o Med dosing has been complicated by prolonged QTc - Psych has seen - please see detailed note and reccs from Psychiatry - change Celexa to Lexapro - do not resume zyprexa - restart abilify - monitor EKG and tele to assure QTc stable   Chest pain Resolved - CTA of chest negative - nuc med stress test unremarkable - lipid panel normal - no further w/u needed  Hypotension Resolved - follow trend without meds for now  Known hx of vasovagal syncope  Dispo transfer to tele bed - monitor QTc while resuming modified psych med regimen  Lonia Blood, MD [facility] Office  [phone] Pager [phone]  On-Call/Text Page:      [email or web address]      password [facility]

## 2011-12-14 NOTE — Progress Notes (Signed)
TELEMETRY: Reviewed telemetry pt in NSR: Filed Vitals:   12/13/11 1600 12/13/11 2000 12/14/11 0000 12/14/11 0400  BP: 90/44 104/45 124/55 105/66  Pulse:      Temp: 98.5 F (36.9 C) 97.9 F (36.6 C) 97.3 F (36.3 C) 97.4 F (36.3 C)  TempSrc: Oral Oral Oral Oral  Resp:      Height:      Weight:      SpO2:        Intake/Output Summary (Last 24 hours) at 12/14/11 0808 Last data filed at 12/14/11 0600  Gross per 24 hour  Intake 4877.08 ml  Output      0 ml  Net 4877.08 ml    SUBJECTIVE Denies any chest pain or SOB. Misses her bipolar meds.  LABS: Basic Metabolic Panel:  Basename 12/14/11 0450 12/13/11 1217 12/13/11 0254  NA 142 -- 138  K 3.5 -- 3.7  CL 116* -- 112  CO2 21 -- 19  GLUCOSE 105* -- 101*  BUN 9 -- 12  CREATININE 0.79 -- 0.83  CALCIUM 8.6 -- 8.4  MG -- 2.1 --  PHOS -- -- --   Liver Function Tests:  Basename 12/12/11 1941 12/12/11 1002  AST 18 19  ALT 15 16  ALKPHOS 64 72  BILITOT 0.5 0.5  PROT 6.5 7.1  ALBUMIN 3.1* 3.4*    Basename 12/12/11 1002  LIPASE 26  AMYLASE --   CBC:  Basename 12/13/11 0254 12/12/11 1002  WBC 6.8 5.2  NEUTROABS -- 2.9  HGB 11.9* 13.2  HCT 35.9* 39.9  MCV 90.7 89.7  PLT 219 223   Cardiac Enzymes:  Basename 12/13/11 1216 12/13/11 0236 12/12/11 1947  CKTOTAL 491* 336* 239*  CKMB 1.7 1.7 1.6  CKMBINDEX -- -- --  TROPONINI <0.30 <0.30 <0.30   BNP: No components found with this basename: POCBNP:3 D-Dimer:  Basename 12/12/11 1002  DDIMER 0.50*   Hemoglobin A1C:  Basename 12/12/11 1749  HGBA1C 5.6   Fasting Lipid Panel:  Basename 12/12/11 1941  CHOL 182  HDL 52  LDLCALC 109*  TRIG 107  CHOLHDL 3.5  LDLDIRECT --   Thyroid Function Tests:  Basename 12/12/11 1940  TSH 2.790  T4TOTAL --  T3FREE --  THYROIDAB --   Anemia Panel: No results found for this basename: VITAMINB12,FOLATE,FERRITIN,TIBC,IRON,RETICCTPCT in the last 72 hours  Radiology/Studies:  Dg Chest 2 View  12/12/2011   *RADIOLOGY REPORT*  Clinical Data: Left upper chest pain, shortness of breath.  CHEST - 2 VIEW  Comparison: None.  Findings: There are mildly accentuated bronchial markings.  There is an area of linear atelectasis versus scarring seen within the right middle lobe.  There are no infiltrative or edematous changes. The heart and mediastinal structures are normal.  IMPRESSION: Mildly accentuated bronchial markings and small area of linear scarring versus atelectasis within the right middle lobe. Otherwise, negative study.  Original Report Authenticated By: Rolla Plate, M.D.   Ct Angio Chest W/cm &/or Wo Cm  12/12/2011  *RADIOLOGY REPORT*  Clinical Data: Left-sided chest pain with radiation to left shoulder  CT ANGIOGRAPHY CHEST  Technique:  Multidetector CT imaging of the chest using the standard protocol during bolus administration of intravenous contrast. Multiplanar reconstructed images including MIPs were obtained and reviewed to evaluate the vascular anatomy.  Contrast: 70mL OMNIPAQUE IOHEXOL 300 MG/ML IV SOLN  Comparison: Chest radiograph of earlier same day  Findings:  There is adequate opacification of the pulmonary arteries and main pulmonary artery measuring 317 HU).  There are  no discrete filling defects within pulmonary arteries to suggest acute pulmonary embolism.  Normal caliber of the main pulmonary artery.  The thoracic aorta is of normal caliber.  No definite periaortic stranding.  Normal configuration of the aortic arch.  Visualized portions of the cervical vasculature appear patent.  Normal heart size.  No pericardial effusion.  No hilar, mediastinal or axillary lymphadenopathy.  There is minimal symmetric dependent ground-glass opacities favored to represent atelectasis.  Linear opacities in the medial aspect of the lingula and right middle lobe also favored to represent plate- like atelectasis.  No focal airspace opacities.  There is minimal central m r face enemas change, most conspicuous in  the left upper lobe.  No discrete pulmonary nodules.  Central airways are patent. No pneumothorax or pleural effusion.  Limited early arterial phase evaluation of the upper abdomen is unremarkable.  The thyroid appears heterogeneous without discrete nodule.  No acute or aggressive osseous abnormalities. There is nonspecific mild diffuse increased sclerosis of the imaged osseous structures.  IMPRESSION:  1.  No acute findings within the chest.  Specifically, no evidence of pulmonary embolism.  2. Nonspecific mild diffuse increase sclerosis of the imaged osseous structures may suggest an underlying metabolic disorder. Clinical correlation is advised.  Original Report Authenticated By: Waynard Reeds, M.D.    PHYSICAL EXAM General: Well developed, well nourished, in no acute distress. Head: Normocephalic, atraumatic, sclera non-icteric, no xanthomas, nares are without discharge. Neck: Negative for carotid bruits. JVD not elevated. Lungs: Clear bilaterally to auscultation without wheezes, rales, or rhonchi. Breathing is unlabored. Heart: RRR S1 S2 without murmurs, rubs, or gallops.  Abdomen: Soft, non-tender, non-distended with normoactive bowel sounds. No hepatomegaly. No rebound/guarding. No obvious abdominal masses. Msk:  Strength and tone appears normal for age. Extremities: No clubbing, cyanosis or edema.  Distal pedal pulses are 2+ and equal bilaterally. Neuro: Alert and oriented X 3. Moves all extremities spontaneously. Psych:  Responds to questions appropriately with a normal affect.  ASSESSMENT AND PLAN: 1. Atypical chest pain. Echo is normal. Chest pain has resolved. For myoview study today. If negative then would Rx conservatively for non cardiac pain. 2. Prolonged QT, Ecg today pending. Probably related to psych meds. 3.Hypotension improved.  Active Problems:  Chest pain  Hypotension    Signed, Yossi Hinchman [place] MD, [facility] 12/14/11

## 2011-12-14 NOTE — Progress Notes (Addendum)
Results of stress test noted below: IMPRESSION: 1. No reversible ischemia or infarction. 2. Normal wall motion. 3. Left ventricular ejection fraction equal 65 %  Please refer to Dr. Elvis Coil note from today - atypical chest pain. Vasopasm was on differential per note yesterday but nuc looks okay. Would treat conservatively. F/u with PCP. QTC was also 463 on today's EKG, improved from prior. Please call with questions.  Dayna Dunn PA-C 12/14/2011 1:07 PM

## 2011-12-14 NOTE — Progress Notes (Signed)
Stress Myoview Patient completed 5 minutes of Bruce protocol. Myoview injected at 4 minutes when patient had reached target HR - she was experiencing SOB and nausea at that time. No chest pain. EKG without acute changes. VSS. Await interpretation of images.  Dayna Dunn PA-C 12/14/2011 11:07 AM

## 2011-12-14 NOTE — Progress Notes (Signed)
   CARE MANAGEMENT NOTE 12/14/2011  Patient:  Alexandra Stein, Alexandra Stein   Account Number:  [id number]  Date Initiated:  12/14/2011  Documentation initiated by:  Onnie Boer  Subjective/Objective Assessment:   PT WAS ADMITTED WITH CP     Action/Plan:   PROGRESSION OF CARE AND DISCHARGE PLANNING   Anticipated DC Date:  12/15/2011   Anticipated DC Plan:  HOME/SELF CARE      DC Planning Services  CM consult      Choice offered to / List presented to:             Status of service:  In process, will continue to follow Medicare Important Message given?   (If response is "NO", the following Medicare IM given date fields will be blank) Date Medicare IM given:   Date Additional Medicare IM given:    Discharge Disposition:    Per UR Regulation:  Reviewed for med. necessity/level of care/duration of stay  Comments:  12/15/11 Roger Kill, BSN 1418 PT WAS WITH CP AND HAS R/O FOR MI AND HAD A MYOVIEW TODAY THAT SHOWED AN EF OF 65% AND NOTHING SERIOUS AWAITING ATTENDINGS OPINION FOR DC.

## 2011-12-14 NOTE — Progress Notes (Signed)
Pharmacy Review of Psychiatric medications:  46 year old female with Bipolar II disorder, anxiety, and migraine headaches on Zyprexa, Abilify, Lamictal, and Topamax.   QTc Prolongation:   1. Patient is on two atypical antipsychotics which can both prolong QTc interval and are duplicate therapy. Recommend that patient be on only one atypical agent. Based on studies/package inserts- Abilify is associated with the least effect on QTc prolongation versus the other atypical antipsychotics. Zyprexa is associated with an effect on QTc (estimated to prolong 1.2 msec from baseline in studies). Of the atypical antipsychotics, Geodon is associated with the most effect on QTc prolongation.   *Recommendation: Reduce patient to one atypical antipsychotic to reduce risk of QTc prolongation. Abilify is the least associated with this issue.   2. Patient on Topamax and Psychiatry mentioned a concern for association with QTc prolongation.   *Recommendation: There are no reports of QTc prolongation with Topamax.   Pharmacy will sign off. Please re-consult if further questions.  Thank you for the consult, Link Snuffer, PharmD 12/14/2011, 2:35 PM

## 2011-12-15 ENCOUNTER — Other Ambulatory Visit: Payer: Self-pay

## 2011-12-15 LAB — BASIC METABOLIC PANEL
BUN: 10 mg/dL (ref 6–23)
Creatinine, Ser: 0.77 mg/dL (ref 0.50–1.10)
GFR calc Af Amer: >90 mL/min (ref >90)
GFR calc non Af Amer: >90 mL/min (ref >90)
Potassium: 3.3 mEq/L — ABNORMAL LOW (ref 3.5–5.1)

## 2011-12-15 LAB — CORTISOL-AM, BLOOD: Cortisol - AM: 16.7 ug/dL (ref 4.3–22.4)

## 2011-12-15 MED ORDER — ESCITALOPRAM OXALATE 20 MG PO TABS
20.0000 mg | ORAL_TABLET | Freq: Every day | ORAL | Status: DC
  Filled 2011-12-15: qty 1

## 2011-12-15 MED ORDER — POTASSIUM CHLORIDE CRYS ER 20 MEQ PO TBCR
40.0000 meq | EXTENDED_RELEASE_TABLET | Freq: Once | ORAL | Status: AC
  Administered 2011-12-15: 40 meq via ORAL
  Filled 2011-12-15: qty 2

## 2011-12-15 MED ORDER — LORAZEPAM 1 MG PO TABS
1.0000 mg | ORAL_TABLET | Freq: Once | ORAL | Status: AC
  Administered 2011-12-15: 1 mg via ORAL
  Filled 2011-12-15: qty 1

## 2011-12-15 NOTE — Progress Notes (Signed)
Utilization review complete 

## 2011-12-15 NOTE — Progress Notes (Signed)
  Subjective: 46 year old female with past medical history significant for bipolar who presented to the emergency department complaining of chest pain.  She was found to have a prolonged QTc and her psych meds were changed.  She reports that her pain has resolved.  She remains quite anxious about resuming her bipolar meds, as she is afraid she will suffer an acute exacerbation of her psychiatric symptoms.  She denies n/v, rigidity, stiffness, neck ache, or ha.    Objective: Weight change:   Intake/Output Summary (Last 24 hours) at 12/15/11 1307 Last data filed at 12/15/11 0900  Gross per 24 hour  Intake 1555.83 ml  Output      0 ml  Net 1555.83 ml   Blood pressure 119/78, pulse 78, temperature 97.5 F (36.4 C), temperature source Oral, resp. rate 18, height 5\' 2"  (1.575 m), weight 93.441 kg (206 lb), last menstrual period 11/18/2011, SpO2 98.00%.  Physical Exam: General: No acute respiratory distress Lungs: Clear to auscultation bilaterally without wheezes or crackles Cardiovascular: Regular rate and rhythm without murmur gallop or rub normal S1 and S2 Abdomen: Nontender, nondistended, soft, bowel sounds positive, no rebound, no ascites, no appreciable mass Extremities: No significant cyanosis, clubbing, or edema bilateral lower extremities  Lab Results:  Basename 12/15/11 0547 12/14/11 0450 12/13/11 1217 12/13/11 0254  NA 141 142 -- 138  K 3.3* 3.5 -- 3.7  CL 111 116* -- 112  CO2 21 21 -- 19  GLUCOSE 99 105* -- 101*  BUN 10 9 -- 12  CREATININE 0.77 0.79 -- 0.83  CALCIUM 9.3 8.6 -- 8.4  MG 2.2 -- 2.1 --  PHOS -- -- -- --    Basename 12/12/11 1941  AST 18  ALT 15  ALKPHOS 64  BILITOT 0.5  PROT 6.5  ALBUMIN 3.1*    Basename 12/13/11 0254  WBC 6.8  NEUTROABS --  HGB 11.9*  HCT 35.9*  MCV 90.7  PLT 219    Basename 12/13/11 1216 12/13/11 0236 12/12/11 1947  CKTOTAL 491* 336* 239*  CKMB 1.7 1.7 1.6  CKMBINDEX -- -- --  TROPONINI <0.30 <0.30 <0.30    Basename  12/12/11 1749  HGBA1C 5.6    Micro Results: Recent Results (from the past 240 hour(s))  MRSA PCR SCREENING     Status: Normal   Collection Time   12/12/11  7:24 PM      Component Value Range Status Comment   MRSA by PCR NEGATIVE  NEGATIVE  Final     Studies/Results: All recent x-ray/radiology reports have been reviewed in detail.   Medications: I have reviewed the patient's complete medication list.  Assessment/Plan:  Bipolar d/o Med dosing has been complicated by prolonged QTc - Psych has seen - please see detailed note and reccs from Psychiatry - change Celexa to Lexapro - do not resume zyprexa - restart abilify - will increase lexapro.  monitor EKG and tele to assure QTc stable.  Discussed with Dr. Ferol Luz  Chest pain Resolved - CTA of chest negative - nuc med stress test unremarkable - lipid panel normal - no further w/u needed  Hypotension Resolved - follow trend without meds for now  Known hx of vasovagal syncope  Dispo transfer to tele bed - monitor QTc while resuming modified psych med regimen- hope for discharge   Marlin Canary, D.O.

## 2011-12-15 NOTE — Progress Notes (Signed)
   CARE MANAGEMENT NOTE 12/15/2011  Patient:  Alexandra Stein, Alexandra Stein   Account Number:  [id number]  Date Initiated:  12/14/2011  Documentation initiated by:  Onnie Boer  Subjective/Objective Assessment:   PT WAS ADMITTED WITH CP     Action/Plan:   PROGRESSION OF CARE AND DISCHARGE PLANNING   Anticipated DC Date:  12/15/2011   Anticipated DC Plan:  HOME/SELF CARE      DC Planning Services  CM consult      Choice offered to / List presented to:             Status of service:  In process, will continue to follow Medicare Important Message given?   (If response is "NO", the following Medicare IM given date fields will be blank) Date Medicare IM given:   Date Additional Medicare IM given:    Discharge Disposition:    Per UR Regulation:  Reviewed for med. necessity/level of care/duration of stay  Comments:  PCP- Fulton Mole  12/15/11- 1500- [name] RN, BSN [phone] Spoke with pt at bedside- per conversaton pt states that she has a good support system and lives at home. Pt reports she has  a PCP-[name] and is able to afford her medications. No anticipated d/c needs. CM to follow  12/15/11 Roger Kill, BSN 1418 PT WAS WITH CP AND HAS R/O FOR MI AND HAD A MYOVIEW TODAY THAT SHOWED AN EF OF 65% AND NOTHING SERIOUS AWAITING ATTENDINGS OPINION FOR DC.

## 2011-12-15 NOTE — Progress Notes (Signed)
Clinical Social Work with [employer] followed up with patient regarding outpatient counseling.  Patient very hesitant at first with regards to stigma and seeing a counselor/psychiatrist in the outpatient.  CSW explained importance along with confidentiality piece of counseling.  Patient questioning outpatient counselor with spiritual back ground and Christianity.  Patient only asked for information and will make contact herself.  CSW will respect her wishes.   Referral information to [facility], [facility] and Private providers were listed and given to patient.  No other needs identified at this time.   Ashley Jacobs, MSW LCSW [phone]

## 2011-12-16 LAB — BASIC METABOLIC PANEL
BUN: 11 mg/dL (ref 6–23)
Calcium: 9.2 mg/dL (ref 8.4–10.5)
GFR calc Af Amer: >90 mL/min (ref >90)
GFR calc non Af Amer: 83 mL/min — ABNORMAL LOW (ref >90)
Glucose, Bld: 99 mg/dL (ref 70–99)

## 2011-12-16 MED ORDER — ARIPIPRAZOLE 10 MG PO TABS: 10.0000 mg | ORAL_TABLET | Freq: Every day | ORAL | Status: DC

## 2011-12-16 MED ORDER — ESCITALOPRAM OXALATE 20 MG PO TABS: 20.0000 mg | ORAL_TABLET | Freq: Every day | ORAL | Status: DC

## 2011-12-16 NOTE — Progress Notes (Signed)
Utilization review complete 

## 2011-12-16 NOTE — Discharge Summary (Signed)
Discharge Summary  Alexandra Stein MR#: [id number]  DOB:09/18/1966  Date of Admission: 12/12/2011 Date of Discharge: 12/16/2011  Patient's PCP: Dr. Nicholos Johns  Attending Physician:Jericca Russett  Consults: Cardiology Psychiatry   Discharge Diagnoses: Active Problems:  Chest pain  Hypotension  Bipolar  Brief Admitting History and Physical 46 year old female with past medical history significant for bipolar present to the emergency department complaining of chest pain. She related she has had 2 episodes 1 the date of admission and the other 2 days prior to admission. She described the pain as sharp in quality, last 15-20 minutes, accompanied with shortness of breath, confusion, numbness of her arm, dizziness.  She denies chest pain on exertion. She does relate shortness of breath on exertion for the last week. She denies cough, fever. The date of admission she had severe episode of chest pain, she felt that she was going to past out. The pain was 9/10 in intensity. She received nitroglycerin in the emergency department which helped with her pain. She reports a episode of vomiting 2 days prior to admission, she vomited like 10 times, the last time she vomited small amount of blood. No more vomiting. She denies abdominal pain.    Discharge Medications Medication List  As of 12/16/2011  9:55 AM   STOP taking these medications         DULoxetine 60 MG capsule      LAMICTAL XR 200 MG Tb24      OLANZapine 5 MG tablet         TAKE these medications         ARIPiprazole 10 MG tablet   Commonly known as: ABILIFY   Take 1 tablet (10 mg total) by mouth daily.      escitalopram 20 MG tablet   Commonly known as: LEXAPRO   Take 1 tablet (20 mg total) by mouth daily.      naproxen sodium 220 MG tablet   Commonly known as: ANAPROX   Take 220 mg by mouth 2 (two) times daily as needed. For pain      promethazine 25 MG tablet   Commonly known as: PHENERGAN   Take 25 mg by mouth every 6 (six)  hours as needed. For nausea      topiramate 100 MG tablet   Commonly known as: TOPAMAX   Take 100 mg by mouth at bedtime.            Hospital Course: Bipolar d/o  Med dosing has been complicated by QTc - Psych has seen - increased lexapro- will refer back to PCP for treatment On abilify, topamax, lexapro  Elevated CK- Watch, no muscle stiffness or soreness- patient did do treadmill stress test- will get repeat next week to trend: Last CK was 549  Chest pain  Resolved - CTA of chest negative - nuc med stress test unremarkable - lipid panel normal - no further w/u needed per cardiology  Hypotension  Resolved - patient's normal appears to be low  Hypokalemia repleated      Day of Discharge BP 106/66  Pulse 66  Temp(Src) 97.5 F (36.4 C) (Oral)  Resp 20  Ht 5\' 2"  (1.575 m)  Wt 93.441 kg (206 lb)  BMI 37.68 kg/m2  SpO2 98%  LMP 11/18/2011  Results for orders placed during the hospital encounter of 12/12/11 (from the past 48 hour(s))  BASIC METABOLIC PANEL     Status: Abnormal   Collection Time   12/15/11  5:47 AM  Component Value Range Comment   Sodium 141  135 - 145 (mEq/L)    Potassium 3.3 (*) 3.5 - 5.1 (mEq/L)    Chloride 111  96 - 112 (mEq/L)    CO2 21  19 - 32 (mEq/L)    Glucose, Bld 99  70 - 99 (mg/dL)    BUN 10  6 - 23 (mg/dL)    Creatinine, Ser 4.09  0.50 - 1.10 (mg/dL)    Calcium 9.3  8.4 - 10.5 (mg/dL)    GFR calc non Af Amer >90  >90 (mL/min)    GFR calc Af Amer >90  >90 (mL/min)   CORTISOL-AM, BLOOD     Status: Normal   Collection Time   12/15/11  5:47 AM      Component Value Range Comment   Cortisol - AM 16.7  4.3 - 22.4 (ug/dL)   MAGNESIUM     Status: Normal   Collection Time   12/15/11  5:47 AM      Component Value Range Comment   Magnesium 2.2  1.5 - 2.5 (mg/dL)   BASIC METABOLIC PANEL     Status: Abnormal   Collection Time   12/16/11  5:32 AM      Component Value Range Comment   Sodium 140  135 - 145 (mEq/L)    Potassium 3.7  3.5 - 5.1  (mEq/L)    Chloride 108  96 - 112 (mEq/L)    CO2 22  19 - 32 (mEq/L)    Glucose, Bld 99  70 - 99 (mg/dL)    BUN 11  6 - 23 (mg/dL)    Creatinine, Ser 8.11  0.50 - 1.10 (mg/dL)    Calcium 9.2  8.4 - 10.5 (mg/dL)    GFR calc non Af Amer 83 (*) >90 (mL/min)    GFR calc Af Amer >90  >90 (mL/min)   CK     Status: Abnormal   Collection Time   12/16/11  5:32 AM      Component Value Range Comment   Total CK 549 (*) 7 - 177 (U/L)     Dg Chest 2 View  12/12/2011  *RADIOLOGY REPORT*  Clinical Data: Left upper chest pain, shortness of breath.  CHEST - 2 VIEW  Comparison: None.  Findings: There are mildly accentuated bronchial markings.  There is an area of linear atelectasis versus scarring seen within the right middle lobe.  There are no infiltrative or edematous changes. The heart and mediastinal structures are normal.  IMPRESSION: Mildly accentuated bronchial markings and small area of linear scarring versus atelectasis within the right middle lobe. Otherwise, negative study.  Original Report Authenticated By: Rolla Plate, M.D.   Ct Angio Chest W/cm &/or Wo Cm  12/12/2011  *RADIOLOGY REPORT*  Clinical Data: Left-sided chest pain with radiation to left shoulder  CT ANGIOGRAPHY CHEST  Technique:  Multidetector CT imaging of the chest using the standard protocol during bolus administration of intravenous contrast. Multiplanar reconstructed images including MIPs were obtained and reviewed to evaluate the vascular anatomy.  Contrast: 70mL OMNIPAQUE IOHEXOL 300 MG/ML IV SOLN  Comparison: Chest radiograph of earlier same day  Findings:  There is adequate opacification of the pulmonary arteries and main pulmonary artery measuring 317 HU).  There are no discrete filling defects within pulmonary arteries to suggest acute pulmonary embolism.  Normal caliber of the main pulmonary artery.  The thoracic aorta is of normal caliber.  No definite periaortic stranding.  Normal configuration of the aortic arch.  Visualized  portions of the cervical vasculature appear patent.  Normal heart size.  No pericardial effusion.  No hilar, mediastinal or axillary lymphadenopathy.  There is minimal symmetric dependent ground-glass opacities favored to represent atelectasis.  Linear opacities in the medial aspect of the lingula and right middle lobe also favored to represent plate- like atelectasis.  No focal airspace opacities.  There is minimal central m r face enemas change, most conspicuous in the left upper lobe.  No discrete pulmonary nodules.  Central airways are patent. No pneumothorax or pleural effusion.  Limited early arterial phase evaluation of the upper abdomen is unremarkable.  The thyroid appears heterogeneous without discrete nodule.  No acute or aggressive osseous abnormalities. There is nonspecific mild diffuse increased sclerosis of the imaged osseous structures.  IMPRESSION:  1.  No acute findings within the chest.  Specifically, no evidence of pulmonary embolism.  2. Nonspecific mild diffuse increase sclerosis of the imaged osseous structures may suggest an underlying metabolic disorder. Clinical correlation is advised.  Original Report Authenticated By: Waynard Reeds, M.D.   Nm Myocar Multi W/spect W/wall Motion / Ef  12/14/2011  *RADIOLOGY REPORT*  Clinical Data:  46 year old female with chest pain and shortness of breath  MYOCARDIAL IMAGING WITH SPECT (REST AND EXERCISE-STRESS) GATED LEFT VENTRICULAR WALL MOTION STUDY LEFT VENTRICULAR EJECTION FRACTION  Technique:  Resting myocardial SPECT imaging was initially performed after intravenous administration of radiopharmaceutical. Myocardial SPECT was subsequently performed after additional radiopharmaceutical injection during exercise-stress supervised by the Cardiology staff.  Quantitative gated imaging was also performed to evaluate left ventricular wall motion, and estimate left ventricular ejection fraction.  Radiopharmaceutical:  Tc-30m Myoview at rest and  during stress.  Comparison: none  Findings:  Technique: The patient exercised for 5 minutes and achieved target heart rate.  Breast size equal four.  Perfusion:  There is no relative decreased counts on stress or rest to suggest reversible ischemia or infarction. There is mild attenuation on the anterior wall on rest and stress which is felt to represent variable breast attenuation.  Wall motion:  No focal wall motion abnormality.  Normal contractility.  Left ventricular ejection fraction:  Calculated left ventricular ejection fraction =  65%  IMPRESSION:  1.  No reversible ischemia or infarction. 2.  Normal wall motion. 3.  Left ventricular ejection fraction equal 65 %  Original Report Authenticated By: Genevive Bi, M.D.     Disposition: home  Diet: heart healthy  Activity: as tolerated   Follow-up Appts: Dr. Nicholos Johns early next week  Discharge Orders    Future Orders Please Complete By Expires   Diet - low sodium heart healthy      Increase activity slowly      Discharge instructions      Comments:   Close follow up with Dr. Nicholos Johns, will need a CK level checked at next visit      TESTS THAT NEED FOLLOW-UP Will need CK at next appointment  Time spent on discharge, talking to the patient, and coordinating care: 50 mins.   SignedMarlin Canary, DO 12/16/2011, 9:55 AM

## 2011-12-16 NOTE — Progress Notes (Signed)
  Subjective: 46 year old female with past medical history significant for bipolar who presented to the emergency department complaining of chest pain.  She was found to have a prolonged QTc and her psych meds were changed.  She reports that her pain has resolved.  She remains quite anxious about resuming her bipolar meds, as she is afraid she will suffer an acute exacerbation of her psychiatric symptoms.  She denies n/v, rigidity, stiffness, neck ache.  Patient is c/o HA this AM- relieved with pain meds  Objective: Weight change:   Intake/Output Summary (Last 24 hours) at 12/16/11 0923 Last data filed at 12/15/11 1700  Gross per 24 hour  Intake    480 ml  Output      0 ml  Net    480 ml   Blood pressure 106/66, pulse 66, temperature 97.5 F (36.4 C), temperature source Oral, resp. rate 20, height 5\' 2"  (1.575 m), weight 93.441 kg (206 lb), last menstrual period 11/18/2011, SpO2 98.00%.  Physical Exam: General: No acute respiratory distress Lungs: Clear to auscultation bilaterally without wheezes or crackles Cardiovascular: Regular rate and rhythm without murmur gallop or rub normal S1 and S2 Abdomen: Nontender, nondistended, soft, bowel sounds positive, no rebound, no ascites, no appreciable mass Extremities: No significant cyanosis, clubbing, or edema bilateral lower extremities  Lab Results:  Basename 12/16/11 0532 12/15/11 0547 12/14/11 0450 12/13/11 1217  NA 140 141 142 --  K 3.7 3.3* 3.5 --  CL 108 111 116* --  CO2 22 21 21  --  GLUCOSE 99 99 105* --  BUN 11 10 9  --  CREATININE 0.84 0.77 0.79 --  CALCIUM 9.2 9.3 8.6 --  MG -- 2.2 -- 2.1  PHOS -- -- -- --      Basename 12/16/11 0532 12/13/11 1216  CKTOTAL 549* 491*  CKMB -- 1.7  CKMBINDEX -- --  TROPONINI -- <0.30     Micro Results: Recent Results (from the past 240 hour(s))  MRSA PCR SCREENING     Status: Normal   Collection Time   12/12/11  7:24 PM      Component Value Range Status Comment   MRSA by PCR  NEGATIVE  NEGATIVE  Final     Studies/Results: All recent x-ray/radiology reports have been reviewed in detail.   Medications: I have reviewed the patient's complete medication list.  Assessment/Plan:  Bipolar d/o Med dosing has been complicated by prolonged QTc - Psych has seen - increased lexapro- pt counseled on length of time to see difference  Elevated CK- Watch, no muscle stiffness or soreness- patient did do treadmill stress test- will get repeat next week to trend  Chest pain Resolved - CTA of chest negative - nuc med stress test unremarkable - lipid panel normal - no further w/u needed  Hypotension Resolved - follow trend without meds for now  Known hx of vasovagal syncope  Dispo Plan to D/C today with close f/up at home  Marlin Canary, D.O.

## 2012-02-08 ENCOUNTER — Other Ambulatory Visit: Payer: Self-pay

## 2012-02-08 ENCOUNTER — Emergency Department (HOSPITAL_COMMUNITY): Payer: BC Managed Care – PPO

## 2012-02-08 ENCOUNTER — Encounter (HOSPITAL_COMMUNITY): Payer: Self-pay | Admitting: *Deleted

## 2012-02-08 ENCOUNTER — Emergency Department (HOSPITAL_COMMUNITY)
Admission: EM | Admit: 2012-02-08 | Discharge: 2012-02-08 | Disposition: A | Discharge status: Home / Self Care | Payer: BC Managed Care – PPO | Attending: Emergency Medicine | Admitting: Emergency Medicine

## 2012-02-08 DIAGNOSIS — R079 Chest pain, unspecified: Secondary | ICD-10-CM | POA: Insufficient documentation

## 2012-02-08 DIAGNOSIS — Z7982 Long term (current) use of aspirin: Secondary | ICD-10-CM | POA: Insufficient documentation

## 2012-02-08 DIAGNOSIS — E785 Hyperlipidemia, unspecified: Secondary | ICD-10-CM | POA: Insufficient documentation

## 2012-02-08 DIAGNOSIS — F319 Bipolar disorder, unspecified: Secondary | ICD-10-CM | POA: Insufficient documentation

## 2012-02-08 DIAGNOSIS — Z79899 Other long term (current) drug therapy: Secondary | ICD-10-CM | POA: Insufficient documentation

## 2012-02-08 DIAGNOSIS — R0602 Shortness of breath: Secondary | ICD-10-CM | POA: Insufficient documentation

## 2012-02-08 LAB — CBC
HCT: 40.5 % (ref 36.0–46.0)
Hemoglobin: 13 g/dL (ref 12.0–15.0)
MCHC: 32.1 g/dL (ref 30.0–36.0)
MCV: 90.8 fL (ref 78.0–100.0)
RDW: 13.3 % (ref 11.5–15.5)
WBC: 5.5 10*3/uL (ref 4.0–10.5)

## 2012-02-08 LAB — BASIC METABOLIC PANEL
BUN: 14 mg/dL (ref 6–23)
Chloride: 111 mEq/L (ref 96–112)
Creatinine, Ser: 0.89 mg/dL (ref 0.50–1.10)
GFR calc Af Amer: 89 mL/min — ABNORMAL LOW (ref >90)
Glucose, Bld: 91 mg/dL (ref 70–99)

## 2012-02-08 LAB — POCT I-STAT TROPONIN I

## 2012-02-08 MED ORDER — OXYCODONE-ACETAMINOPHEN 5-325 MG PO TABS
ORAL_TABLET | ORAL | Status: AC
  Administered 2012-02-08: 1 via ORAL
  Filled 2012-02-08: qty 1

## 2012-02-08 MED ORDER — ALBUTEROL SULFATE HFA 108 (90 BASE) MCG/ACT IN AERS
2.0000 | INHALATION_SPRAY | RESPIRATORY_TRACT | Status: DC | PRN
  Filled 2012-02-08: qty 6.7

## 2012-02-08 MED ORDER — NITROGLYCERIN 0.4 MG SL SUBL
SUBLINGUAL_TABLET | SUBLINGUAL | Status: AC
  Filled 2012-02-08: qty 25

## 2012-02-08 MED ORDER — OXYCODONE-ACETAMINOPHEN 5-325 MG PO TABS
1.0000 | ORAL_TABLET | Freq: Once | ORAL | Status: AC
  Administered 2012-02-08 (×2): 1 via ORAL

## 2012-02-08 MED ORDER — IOHEXOL 350 MG/ML SOLN
70.0000 mL | Freq: Once | INTRAVENOUS | Status: AC | PRN
  Administered 2012-02-08: 70 mL via INTRAVENOUS

## 2012-02-08 MED ORDER — ASPIRIN 81 MG PO CHEW
CHEWABLE_TABLET | ORAL | Status: AC
  Administered 2012-02-08: 08:00:00
  Filled 2012-02-08: qty 3

## 2012-02-08 NOTE — ED Notes (Signed)
Pt is here with chest pain and sob since 3am and recently hospitalized for the same and was told has prolonged heart rate

## 2012-02-08 NOTE — ED Provider Notes (Signed)
History     CSN: [id number]  Arrival date & time 02/08/12  3086   First MD Initiated Contact with Patient 02/08/12 [phone]      Chief Complaint  Patient presents with  . Chest Pain  . Shortness of Breath    (Consider location/radiation/quality/duration/timing/severity/associated sxs/prior treatment) HPI Pt presents with c/o chest pain and mild sob.  Pt states the pain woke her up from sleep at 3am.  Pain described as in left chest, sharp and aching in nature.  No fever/cough.  No leg swelling, no hx DVT/PE, no recent travel/surgery/trauma.  Pt was recently admitted for similar symptoms and had negative treadmill stress test.  She has not tried any treatments prior to arrival for the symptoms.  There are no alleviating or modifying factors.  There are no other associated systemic symptoms.   Past Medical History  Diagnosis Date  . Bipolar 2 disorder   . Migraines   . History of syncope 2002    Vasovagal  . Hyperlipidemia     Past Surgical History  Procedure Date  . Cesarean section     Family History  Problem Relation Age of Onset  . Dilated cardiomyopathy Mother     with ICD  . Lung cancer    . Breast cancer    . Diabetes type II Father     History  Substance Use Topics  . Smoking status: Never Smoker   . Smokeless tobacco: Not on file  . Alcohol Use: No    OB History    Grav Para Term Preterm Abortions TAB SAB Ect Mult Living                  Review of Systems ROS reviewed and all otherwise negative except for mentioned in HPI  Allergies  Codeine  Home Medications   Current Outpatient Rx  Name Route Sig Dispense Refill  . ARIPIPRAZOLE 10 MG PO TABS Oral Take 1 tablet (10 mg total) by mouth daily. 30 tablet 0  . ASPIRIN 81 MG PO CHEW Oral Chew 81 mg by mouth daily.    . DULOXETINE HCL 60 MG PO CPEP Oral Take 60 mg by mouth daily.    Marland Kitchen OLANZAPINE 2.5 MG PO TABS Oral Take 2.5 mg by mouth at bedtime.    Marland Kitchen POTASSIUM CHLORIDE CRYS ER 20 MEQ PO TBCR Oral  Take 20 mEq by mouth daily.    . TOPIRAMATE 100 MG PO TABS Oral Take 100 mg by mouth at bedtime.       BP 131/78  Pulse 89  Temp(Src) 98.7 F (37.1 C) (Oral)  Resp 20  SpO2 98%  LMP 02/08/2012 Vitals reviewed Physical Exam Physical Examination: General appearance - alert, well appearing, and in no distress Mental status - alert, oriented to person, place, and time Eyes - pupils equal and reactive, no scleral icterus Mouth - mucous membranes moist, pharynx normal without lesions Chest - clear to auscultation, no wheezes, rales or rhonchi, symmetric air entry, chest wall nontender Heart - normal rate, regular rhythm, normal S1, S2, no murmurs, rubs, clicks or gallops Abdomen - soft, nontender, nondistended, no masses or organomegaly Extremities - peripheral pulses normal, no pedal edema, no clubbing or cyanosis Skin - normal coloration and turgor, no rashes, brisk cap refill  ED Course  Procedures (including critical care time)   Date: 02/08/2012  Rate: 77  Rhythm: normal sinus rhythm  QRS Axis: normal  Intervals: normal  ST/T Wave abnormalities: nonspecific T wave changes  Conduction  Disutrbances:none  Narrative Interpretation:   Old EKG Reviewed: unchanged, compared to prior to EKG 12/15/11    Labs Reviewed  BASIC METABOLIC PANEL - Abnormal; Notable for the following:    GFR calc non Af Amer 77 (*)    GFR calc Af Amer 89 (*)    All other components within normal limits  D-DIMER, QUANTITATIVE - Abnormal; Notable for the following:    D-Dimer, Quant 0.59 (*)    All other components within normal limits  CBC  POCT I-STAT TROPONIN I  LAB REPORT - SCANNED   Dg Chest 2 View  02/08/2012  *RADIOLOGY REPORT*  Clinical Data: Chest pain, shortness of breath  CHEST - 2 VIEW  Comparison:  12/12/2011  Findings:  The heart size and mediastinal contours are within normal limits.  Both lungs are clear.  The visualized skeletal structures are unremarkable.  IMPRESSION: No active  cardiopulmonary disease.  Original Report Authenticated By: Judie Petit. Ruel Favors, M.D.   Ct Angio Chest W/cm &/or Wo Cm  02/08/2012  *RADIOLOGY REPORT*  Clinical Data: Left-sided chest pain.  CT ANGIOGRAPHY CHEST  Technique:  Multidetector CT imaging of the chest using the standard protocol during bolus administration of intravenous contrast. Multiplanar reconstructed images including MIPs were obtained and reviewed to evaluate the vascular anatomy.  Contrast:  70 ml Omnipaque 350  Comparison:  12/12/2011  Findings: Pulmonary arterial opacification is excellent.  There are no pulmonary emboli.  No aortic pathology is seen.  No coronary artery calcification is evident.  Some linear atelectasis previously seen in the lingula has resolved.  I think there is a pattern of increased low-level ground- glass opacity within the lungs, most notable at the lung bases. This raises the possibility of low-level / viral pneumonitis.  Scans in the upper abdomen do not show any worrisome finding.  IMPRESSION: No pulmonary emboli.  Development of hazy/patchy pulmonary density bilaterally since the previous study.  This is most consistent with viral pneumonitis. Other forms of interstitial lung disease are possible.  Original Report Authenticated By: Thomasenia Sales, M.D.     1. Chest pain       MDM  Pt presents with c/o left sided chest pain which woke her up at 3am.  Workup in ED reveals elevated d-dimer, CT angio without evidence for PE.  Showed evidence of possible viral pneumonitis.  Pt with no leukocytosis, fever, cough to suggest pneumonia.  Low suspicion for ACS especially due to recent normal stress test.  Discharged with strict return precautions.  Pt is agreeable with plan and will arrange for follow up on an outpatient basis.         Ethelda Chick, MD 02/10/12 [phone]

## 2012-02-08 NOTE — ED Notes (Signed)
Pt instructed on MDI with return demonstration.  Well tolerated

## 2012-02-08 NOTE — Discharge Instructions (Signed)
Return to the ED with any concerns including difficulty breathing, worsening pain, leg swelling, fainting, fever/chills, decreased level of alertness/lethargy, or any other alarming symptoms  You should use the albuterol inhaler 2 puffs every 4 hours

## 2012-02-08 NOTE — ED Notes (Signed)
Pt reports that she has a hx of CP and was kept in the hospital for one week through multiple diagnostic tests that resulted in the diagnosis of prolonged QT.  She now describes her pain as being toward her left side over her breast.  She denies anything exacerbating or relieving her pain.

## 2012-02-14 ENCOUNTER — Other Ambulatory Visit: Payer: Self-pay | Admitting: Family Medicine

## 2012-02-14 DIAGNOSIS — Z1231 Encounter for screening mammogram for malignant neoplasm of breast: Secondary | ICD-10-CM

## 2012-02-17 ENCOUNTER — Ambulatory Visit
Admission: RE | Admit: 2012-02-17 | Discharge: 2012-02-17 | Disposition: A | Discharge status: Home / Self Care | Payer: BC Managed Care – PPO | Source: Ambulatory Visit | Attending: Family Medicine | Admitting: Family Medicine

## 2012-02-17 DIAGNOSIS — Z1231 Encounter for screening mammogram for malignant neoplasm of breast: Secondary | ICD-10-CM

## 2012-03-30 ENCOUNTER — Emergency Department (HOSPITAL_COMMUNITY): Payer: BC Managed Care – PPO

## 2012-03-30 ENCOUNTER — Encounter (HOSPITAL_COMMUNITY): Payer: Self-pay | Admitting: *Deleted

## 2012-03-30 ENCOUNTER — Emergency Department (HOSPITAL_COMMUNITY)
Admission: EM | Admit: 2012-03-30 | Discharge: 2012-03-30 | Disposition: A | Discharge status: Home / Self Care | Payer: BC Managed Care – PPO | Attending: Emergency Medicine | Admitting: Emergency Medicine

## 2012-03-30 DIAGNOSIS — R079 Chest pain, unspecified: Secondary | ICD-10-CM | POA: Insufficient documentation

## 2012-03-30 DIAGNOSIS — R0602 Shortness of breath: Secondary | ICD-10-CM | POA: Insufficient documentation

## 2012-03-30 LAB — CBC
HCT: 38.3 % (ref 36.0–46.0)
Hemoglobin: 12.8 g/dL (ref 12.0–15.0)
MCV: 88.2 fL (ref 78.0–100.0)
RDW: 13.3 % (ref 11.5–15.5)
WBC: 5.1 10*3/uL (ref 4.0–10.5)

## 2012-03-30 LAB — BASIC METABOLIC PANEL
BUN: 13 mg/dL (ref 6–23)
CO2: 20 mEq/L (ref 19–32)
Chloride: 108 mEq/L (ref 96–112)
Creatinine, Ser: 0.86 mg/dL (ref 0.50–1.10)
GFR calc Af Amer: >90 mL/min (ref >90)
Glucose, Bld: 101 mg/dL — ABNORMAL HIGH (ref 70–99)
Potassium: 4 mEq/L (ref 3.5–5.1)

## 2012-03-30 LAB — HEPATIC FUNCTION PANEL
AST: 24 U/L (ref 0–37)
Albumin: 3.5 g/dL (ref 3.5–5.2)
Total Bilirubin: 0.5 mg/dL (ref 0.3–1.2)
Total Protein: 7 g/dL (ref 6.0–8.3)

## 2012-03-30 LAB — POCT I-STAT TROPONIN I: Troponin i, poc: 0 ng/mL (ref 0.00–0.08)

## 2012-03-30 MED ORDER — NITROGLYCERIN 0.4 MG SL SUBL
0.4000 mg | SUBLINGUAL_TABLET | SUBLINGUAL | Status: DC | PRN
  Administered 2012-03-30: 0.4 mg via SUBLINGUAL
  Filled 2012-03-30: qty 25

## 2012-03-30 MED ORDER — SODIUM CHLORIDE 0.9 % IV SOLN
Freq: Once | INTRAVENOUS | Status: AC
  Administered 2012-03-30: 11:00:00 via INTRAVENOUS

## 2012-03-30 MED ORDER — ASPIRIN 325 MG PO TABS
325.0000 mg | ORAL_TABLET | ORAL | Status: AC
  Administered 2012-03-30: 325 mg via ORAL
  Filled 2012-03-30: qty 1

## 2012-03-30 NOTE — ED Notes (Signed)
Patient reports onset of chest pain, sob, and pressure since 0300.  She has had similar event in past and was hospitalized.  She does not have cardilogist.

## 2012-03-30 NOTE — ED Provider Notes (Signed)
History     CSN: [id number]  Arrival date & time 03/30/12  4098   First MD Initiated Contact with Patient 03/30/12 1037      Chief Complaint  Patient presents with  . Chest Pain  . Shortness of Breath    (Consider location/radiation/quality/duration/timing/severity/associated sxs/prior treatment) HPI Comments: Patient presents with complaint of mid chest pain and pressure, described as alternating between sharp and dull, since approximately 3 AM today. It is associated with shortness of breath. Patient has had these symptoms in the past. She was admitted to the hospital in February 2013 and had a negative nuclear stress test. Patient had a emergency department visits last month and had a negative CT angio. Pain is constant and continues. Nothing makes it better. Patient states it is worse when she sits or stands. She denies fever or chills, upper respiratory symptoms, vomiting, abdominal pain, urinary symptoms. Mother had heart problems beginning at age 26.   Patient is a 46 y.o. female presenting with chest pain. The history is provided by the patient.  Chest Pain The chest pain began 6 - 12 hours ago. Duration of episode(s) is 7 hours. Chest pain occurs constantly. The chest pain is unchanged. The quality of the pain is described as dull and sharp. The pain does not radiate. Primary symptoms include shortness of breath and nausea. Pertinent negatives for primary symptoms include no fever, no syncope, no cough, no wheezing, no palpitations, no abdominal pain and no vomiting.  Pertinent negatives for associated symptoms include no diaphoresis. She tried nothing for the symptoms.     Past Medical History  Diagnosis Date  . Bipolar 2 disorder   . Migraines   . History of syncope 2002    Vasovagal  . Hyperlipidemia     Past Surgical History  Procedure Date  . Cesarean section     Family History  Problem Relation Age of Onset  . Dilated cardiomyopathy Mother     with ICD  .  Lung cancer    . Breast cancer    . Diabetes type II Father     History  Substance Use Topics  . Smoking status: Never Smoker   . Smokeless tobacco: Not on file  . Alcohol Use: No    OB History    Grav Para Term Preterm Abortions TAB SAB Ect Mult Living                  Review of Systems  Constitutional: Negative for fever and diaphoresis.  HENT: Negative for neck pain.   Eyes: Negative for redness.  Respiratory: Positive for shortness of breath. Negative for cough and wheezing.   Cardiovascular: Positive for chest pain. Negative for palpitations, leg swelling and syncope.  Gastrointestinal: Positive for nausea. Negative for vomiting and abdominal pain.  Genitourinary: Negative for dysuria.  Musculoskeletal: Negative for back pain.  Skin: Negative for rash.  Neurological: Negative for syncope and light-headedness.    Allergies  Codeine  Home Medications   Current Outpatient Rx  Name Route Sig Dispense Refill  . ARIPIPRAZOLE 10 MG PO TABS Oral Take 10 mg by mouth at bedtime.    . ASPIRIN 81 MG PO CHEW Oral Chew 81 mg by mouth at bedtime.     . DULOXETINE HCL 60 MG PO CPEP Oral Take 60 mg by mouth at bedtime.     Marland Kitchen OLANZAPINE 2.5 MG PO TABS Oral Take 2.5 mg by mouth at bedtime.    Marland Kitchen POTASSIUM CHLORIDE CRYS ER 20  MEQ PO TBCR Oral Take 20 mEq by mouth at bedtime.     . TOPIRAMATE 100 MG PO TABS Oral Take 100 mg by mouth at bedtime.       BP 110/67  Pulse 78  Temp(Src) 98.5 F (36.9 C) (Oral)  Resp 20  Ht 5\' 2"  (1.575 m)  Wt 224 lb (101.606 kg)  BMI 40.97 kg/m2  SpO2 99%  LMP 03/30/2012  Physical Exam  Nursing note and vitals reviewed. Constitutional: She appears well-developed and well-nourished.  HENT:  Head: Normocephalic and atraumatic.  Mouth/Throat: Mucous membranes are normal. Mucous membranes are not dry.  Eyes: Conjunctivae are normal.  Neck: Trachea normal and normal range of motion. Neck supple. Normal carotid pulses and no JVD present. No  muscular tenderness present. Carotid bruit is not present. No tracheal deviation present.  Cardiovascular: Normal rate, regular rhythm, S1 normal, S2 normal, normal heart sounds and intact distal pulses.  Exam reveals no decreased pulses.   No murmur heard. Pulmonary/Chest: Effort normal. No respiratory distress. She has no wheezes. She exhibits no tenderness.  Abdominal: Soft. Normal aorta and bowel sounds are normal. There is no tenderness. There is no rebound and no guarding.  Musculoskeletal: Normal range of motion.  Neurological: She is alert.  Skin: Skin is warm and dry. She is not diaphoretic. No cyanosis. No pallor.  Psychiatric: She has a normal mood and affect.    ED Course  Procedures (including critical care time)  Labs Reviewed  BASIC METABOLIC PANEL - Abnormal; Notable for the following:    Glucose, Bld 101 (*)    GFR calc non Af Amer 80 (*)    All other components within normal limits  CBC  POCT I-STAT TROPONIN I  HEPATIC FUNCTION PANEL  LAB REPORT - SCANNED   Dg Chest 2 View  03/30/2012  *RADIOLOGY REPORT*  Clinical Data: Chest pain and shortness of breath.  CHEST - 2 VIEW  Comparison: 02/08/2012  Findings: Two views of the chest were obtained.  Lungs are clear without focal airspace disease or edema.  No evidence for a pneumothorax. Heart and mediastinum are within normal limits.  The trachea is midline.  Slightly low lung volumes which are unchanged.  IMPRESSION: No acute chest findings.  Original Report Authenticated By: Richarda Overlie, M.D.     1. Chest pain     10:47 AM Patient seen and examined. Work-up initiated. Medications ordered.   Vital signs reviewed and are as follows: Filed Vitals:   03/30/12 0943  BP: 110/67  Pulse: 78  Temp: 98.5 F (36.9 C)  Resp: 20    Date: 03/30/2012  Rate: 78  Rhythm: normal sinus rhythm  QRS Axis: normal  Intervals: normal  ST/T Wave abnormalities: nonspecific T wave changes  Conduction Disutrbances:none  Narrative  Interpretation:   Old EKG Reviewed: unchanged since 02/08/2012   Patient and labs discussed with Dr. Judd Lien, who has seen patient.   Patient is low risk and has atypical pain. She will be discharged to home with cardiology and PCP follow-up.   Patient was counseled to return with severe chest pain, especially if the pain is crushing or pressure-like and spreads to the arms, back, neck, or jaw, or if they have sweating, nausea, or shortness of breath with the pain. They were encouraged to call 911 with these symptoms.   They were also told to return if their chest pain gets worse and does not go away with rest, they have an attack of chest pain lasting  longer than usual despite rest and treatment with the medications their caregiver has prescribed, if they wake from sleep with chest pain or shortness of breath, if they feel dizzy or faint, if they have chest pain not typical of their usual pain, or if they have any other emergent concerns regarding their health.  The patient verbalized understanding and agreed.   MDM  Patient with chest tightness.  Recent neg stress, CT angio, no significant risk factors for PE. Neg cardiac enzymes, unchanged EKG, no exertional component. Do not suspect ACS. Patient is low risk, do not feel she needs admission and is safe for discharge. She will need further PCP/cards work-up to eval recurring symptoms.          Renne Crigler, [place] 04/05/12 2110

## 2012-03-30 NOTE — ED Notes (Signed)
PA at bedside.

## 2012-03-30 NOTE — Discharge Instructions (Signed)
Please read and follow all provided instructions.  Your diagnoses today include:  1. Chest pain     Tests performed today include:  An EKG of your heart  A chest x-ray  Cardiac enzymes - a blood test for heart muscle damage  Blood counts and electrolytes  Vital signs. See below for your results today.   Medications prescribed:   None  Follow-up instructions: Please follow-up with your primary care provider as soon as you can for further evaluation of your symptoms. If you do not have a primary care doctor -- see below for referral information.   Also you may contact the cardiology referral given for further evaluation into your risk factors.   Return instructions:  SEEK IMMEDIATE MEDICAL ATTENTION IF:  You have severe chest pain, especially if the pain is crushing or pressure-like and spreads to the arms, back, neck, or jaw, or if you have sweating, nausea (feeling sick to your stomach), or shortness of breath. THIS IS AN EMERGENCY. Don't wait to see if the pain will go away. Get medical help at once. Call 911 or 0 (operator). DO NOT drive yourself to the hospital.   Your chest pain gets worse and does not go away with rest.   You have an attack of chest pain lasting longer than usual, despite rest and treatment with the medications your caregiver has prescribed.   You wake from sleep with chest pain or shortness of breath.  You feel dizzy or faint.  You have chest pain not typical of your usual pain for which you originally saw your caregiver.   You have any other emergent concerns regarding your health.  Additional Information: Chest pain comes from many different causes. Your caregiver has diagnosed you as having chest pain that is not specific for one problem, but does not require admission.  You are at low risk for an acute heart condition or other serious illness.   Your vital signs today were: BP 118/77  Pulse 69  Temp(Src) 98.5 F (36.9 C) (Oral)  Resp 18   Ht 5\' 2"  (1.575 m)  Wt 224 lb (101.606 kg)  BMI 40.97 kg/m2  SpO2 10%  LMP 03/30/2012 If your blood pressure (BP) was elevated above 135/85 this visit, please have this repeated by your doctor within one month. -------------- No Primary Care Doctor Call [facility]  [phone] Other agencies that provide inexpensive medical care    [facility]  [phone]    [facility]  [phone]    [facility]  [phone]    [facility]  [phone]    [facility]  [phone]    [facility]  [phone] -------------- RESOURCE GUIDE:  Dental Problems  Patients with Medicaid: [facility] [phone] [address]                                            [phone] [address] Phone:  [phone]  Phone:  [phone]  If unable to pay or uninsured, contact:  [facility] or [facility]. to become qualified for the adult dental clinic.  Chronic Pain Problems Contact [facility]  [phone] Patients need to be referred by their primary care doctor.  Insufficient Money for Medicine Contact [employer]:  call "211" or [facility] [phone].  Psychological Services [facility]  [phone] [facility]  [phone] [facility]   [phone] (emergency services [phone])  Substance Abuse Resources [facility]  [phone] [facility] [phone] [facility] [phone] Floydene Flock [phone] Residential & Outpatient Substance Abuse Program  [phone]  Abuse/Neglect [facility] Child Abuse Hotline [phone] [facility] Child Abuse Hotline [phone] (After Hours)  Emergency Shelter [facility] [phone]  Maternity Homes [facility] [phone] [facility] [phone]  [facility] Resources  [facility]     [employer]                          [facility] Dept. [address] [place]                       [address]      [address]  Blondell Reveal Phone:  [phone]                                   Phone:  [phone]                 Phone:  [phone]  [facility] Phone:  [phone]  [facility] Child Abuse Hotline [phone] [phone] (After Hours)

## 2012-04-06 NOTE — ED Provider Notes (Signed)
Medical screening examination/treatment/procedure(s) were conducted as a shared visit with non-physician practitioner(s) and myself.  I personally evaluated the patient during the encounter.  I saw and examined the patient along with Felicita Gage and agree with his note, assessment, and plan.  The patient presents to the ER with chest discomfort since 3 AM.  She recently underwent a nuclear stress test that was unremarkable, a cta that was negative for pe.  On exam, the vitals are stable and the patient is afebrile.  The heart is rrr without murmurs, the lungs are clear and equal bilaterally.  The abd is benign and there is no edema.  The workup reveals an unchanged ekg, negative chest xray, and negative cardiac enzymes.  I doubt a cardiac or other emergent etiology.  She will be discharged with follow up with her primary provider.  Geoffery Lyons, MD 04/06/12 [phone]

## 2012-04-23 ENCOUNTER — Other Ambulatory Visit: Payer: Self-pay | Admitting: Interventional Cardiology

## 2012-04-26 ENCOUNTER — Encounter (HOSPITAL_BASED_OUTPATIENT_CLINIC_OR_DEPARTMENT_OTHER)
Admission: RE | Disposition: A | Discharge status: Home / Self Care | Payer: Self-pay | Source: Ambulatory Visit | Attending: Interventional Cardiology

## 2012-04-26 ENCOUNTER — Inpatient Hospital Stay (HOSPITAL_BASED_OUTPATIENT_CLINIC_OR_DEPARTMENT_OTHER)
Admission: RE | Admit: 2012-04-26 | Discharge: 2012-04-26 | Disposition: A | Discharge status: Home / Self Care | Payer: BC Managed Care – PPO | Source: Ambulatory Visit | Attending: Interventional Cardiology | Admitting: Interventional Cardiology

## 2012-04-26 DIAGNOSIS — R079 Chest pain, unspecified: Secondary | ICD-10-CM | POA: Insufficient documentation

## 2012-04-26 SURGERY — JV LEFT HEART CATHETERIZATION WITH CORONARY ANGIOGRAM
Anesthesia: Moderate Sedation

## 2012-04-26 MED ORDER — ASPIRIN 81 MG PO CHEW
324.0000 mg | CHEWABLE_TABLET | ORAL | Status: AC
  Administered 2012-04-26: 324 mg via ORAL

## 2012-04-26 MED ORDER — MORPHINE SULFATE 2 MG/ML IJ SOLN: 1.0000 mg | INTRAMUSCULAR | Status: DC | PRN

## 2012-04-26 MED ORDER — SODIUM CHLORIDE 0.9 % IJ SOLN: 3.0000 mL | INTRAMUSCULAR | Status: DC | PRN

## 2012-04-26 MED ORDER — SODIUM CHLORIDE 0.9 % IJ SOLN: 3.0000 mL | Freq: Two times a day (BID) | INTRAMUSCULAR | Status: DC

## 2012-04-26 MED ORDER — SODIUM CHLORIDE 0.9 % IV SOLN: 250.0000 mL | INTRAVENOUS | Status: DC | PRN

## 2012-04-26 MED ORDER — SODIUM CHLORIDE 0.9 % IV SOLN
INTRAVENOUS | Status: DC
  Administered 2012-04-26: 08:00:00 via INTRAVENOUS

## 2012-04-26 MED ORDER — ACETAMINOPHEN 325 MG PO TABS: 650.0000 mg | ORAL_TABLET | ORAL | Status: DC | PRN

## 2012-04-26 MED ORDER — DIAZEPAM 5 MG PO TABS
5.0000 mg | ORAL_TABLET | ORAL | Status: AC
  Administered 2012-04-26: 5 mg via ORAL

## 2012-04-26 MED ORDER — SODIUM CHLORIDE 0.9 % IV SOLN: 1.0000 mL/kg/h | INTRAVENOUS | Status: AC

## 2012-04-26 NOTE — H&P (Signed)
  Date of Initial H&P: 04/17/12  History reviewed, patient examined, no change in status, stable for surgery.  Questions answered.

## 2012-04-26 NOTE — CV Procedure (Signed)
PROCEDURE:  Left heart catheterization with selective coronary angiography, left ventriculogram.  INDICATIONS:  Persistent, recurrent chest pain.  The risks, benefits, and details of the procedure were explained to the patient.  The patient verbalized understanding and wanted to proceed.  Informed written consent was obtained.  PROCEDURE TECHNIQUE:  After Xylocaine anesthesia a 16F sheath was placed in the right femoral artery with a single anterior needle wall stick.   Left coronary angiography was done using a Judkins L4 guide catheter.  Right coronary angiography was done using a Judkins R4 guide catheter.  Left ventriculography was done using a pigtail catheter.     CONTRAST:  Total of 95 cc.  COMPLICATIONS:  None.    HEMODYNAMICS:  Aortic pressure was 95/58; LV pressure was 100/16; LVEDP 20.  There was no gradient between the left ventricle and aorta.    ANGIOGRAPHIC DATA:   The left main coronary artery is a large vessel which is widely patent..  The left anterior descending artery is a large vessel which reaches the apex.  There are 2 large diagonals which are widely patent.  The left circumflex artery is a very small vessel.  There is a small ramus as well.  Both vessels are widely patent.  The right coronary artery is a large dominant vessel.  The posterior descending artery is medium-sized and widely patent.  There are branches which extend across the lateral wall which are widely patent.  Because of the small size of the circumflex, we looked to see if there was an anomalous circumflex.  There is no evidence of any anomalous left circumflex.  LEFT VENTRICULOGRAM:  Left ventricular angiogram was done in the 30 RAO projection and revealed normal left ventricular wall motion and systolic function with an estimated ejection fraction of 60 %.  LVEDP was 20 mmHg.  ABDOMINAL AORTOGRAM: No abdominal aortic aneurysm.  Bilateral single renal arteries which are widely patent.  The SMA is  large and widely patent.  The aortoiliac bifurcation appears widely patent.  IMPRESSIONS:  1. Normal left main coronary artery. 2. Normal left anterior descending artery and its branches. 3. Small, but patent, left circumflex artery and its branches.  No evidence of an anomalous circumflex. 4. Normal, large, dominant right coronary artery. 5. Normal left ventricular systolic function.  LVEDP 20 mmHg.  Ejection fraction 60 %.  RECOMMENDATION:  I don't think her pain is cardiac in nature.  Continue aggressive preventive therapy.  Other noncardiac etiologies of pain should be considered.

## 2012-04-26 NOTE — Progress Notes (Signed)
Bedrest begins @ 0915.  Tegaderm dressing applied to right groin site, which is level 0.

## 2012-07-25 ENCOUNTER — Emergency Department (HOSPITAL_COMMUNITY): Payer: BC Managed Care – PPO

## 2012-07-25 ENCOUNTER — Emergency Department (HOSPITAL_COMMUNITY)
Admission: EM | Admit: 2012-07-25 | Discharge: 2012-07-25 | Disposition: A | Discharge status: Home / Self Care | Payer: BC Managed Care – PPO | Source: Home / Self Care | Attending: Emergency Medicine | Admitting: Emergency Medicine

## 2012-07-25 ENCOUNTER — Encounter (HOSPITAL_COMMUNITY): Payer: Self-pay

## 2012-07-25 DIAGNOSIS — Z6841 Body Mass Index (BMI) 40.0 and over, adult: Secondary | ICD-10-CM

## 2012-07-25 DIAGNOSIS — K8020 Calculus of gallbladder without cholecystitis without obstruction: Principal | ICD-10-CM | POA: Diagnosis present

## 2012-07-25 DIAGNOSIS — N898 Other specified noninflammatory disorders of vagina: Secondary | ICD-10-CM | POA: Insufficient documentation

## 2012-07-25 DIAGNOSIS — K8050 Calculus of bile duct without cholangitis or cholecystitis without obstruction: Secondary | ICD-10-CM

## 2012-07-25 DIAGNOSIS — R109 Unspecified abdominal pain: Secondary | ICD-10-CM | POA: Insufficient documentation

## 2012-07-25 DIAGNOSIS — R10819 Abdominal tenderness, unspecified site: Secondary | ICD-10-CM | POA: Insufficient documentation

## 2012-07-25 DIAGNOSIS — F319 Bipolar disorder, unspecified: Secondary | ICD-10-CM | POA: Diagnosis present

## 2012-07-25 DIAGNOSIS — R198 Other specified symptoms and signs involving the digestive system and abdomen: Secondary | ICD-10-CM | POA: Insufficient documentation

## 2012-07-25 DIAGNOSIS — R197 Diarrhea, unspecified: Secondary | ICD-10-CM | POA: Insufficient documentation

## 2012-07-25 DIAGNOSIS — R110 Nausea: Secondary | ICD-10-CM | POA: Insufficient documentation

## 2012-07-25 LAB — URINALYSIS, ROUTINE W REFLEX MICROSCOPIC
Bilirubin Urine: NEGATIVE
Glucose, UA: NEGATIVE mg/dL
Nitrite: NEGATIVE
Specific Gravity, Urine: 1.019 (ref 1.005–1.030)
pH: 5 (ref 5.0–8.0)

## 2012-07-25 LAB — URINE MICROSCOPIC-ADD ON

## 2012-07-25 LAB — COMPREHENSIVE METABOLIC PANEL
ALT: 26 U/L (ref 0–35)
Albumin: 3.6 g/dL (ref 3.5–5.2)
Calcium: 9.4 mg/dL (ref 8.4–10.5)
GFR calc Af Amer: >90 mL/min (ref >90)
Glucose, Bld: 96 mg/dL (ref 70–99)
Sodium: 139 mEq/L (ref 135–145)
Total Protein: 7.1 g/dL (ref 6.0–8.3)

## 2012-07-25 LAB — CBC WITH DIFFERENTIAL/PLATELET
Basophils Relative: 0 % (ref 0–1)
Eosinophils Absolute: 0.1 10*3/uL (ref 0.0–0.7)
Eosinophils Relative: 2 % (ref 0–5)
Lymphs Abs: 2.1 10*3/uL (ref 0.7–4.0)
MCH: 29.3 pg (ref 26.0–34.0)
MCHC: 32.9 g/dL (ref 30.0–36.0)
MCV: 89 fL (ref 78.0–100.0)
Neutrophils Relative %: 67 % (ref 43–77)
Platelets: 280 10*3/uL (ref 150–400)
RBC: 4.17 MIL/uL (ref 3.87–5.11)
RDW: 13.3 % (ref 11.5–15.5)

## 2012-07-25 LAB — WET PREP, GENITAL

## 2012-07-25 MED ORDER — IOHEXOL 300 MG/ML  SOLN
100.0000 mL | Freq: Once | INTRAMUSCULAR | Status: AC | PRN
  Administered 2012-07-25: 100 mL via INTRAVENOUS

## 2012-07-25 MED ORDER — ONDANSETRON HCL 4 MG/2ML IJ SOLN
4.0000 mg | Freq: Once | INTRAMUSCULAR | Status: AC
  Administered 2012-07-25: 4 mg via INTRAVENOUS
  Filled 2012-07-25: qty 2

## 2012-07-25 MED ORDER — SODIUM CHLORIDE 0.9 % IV BOLUS (SEPSIS)
1000.0000 mL | Freq: Once | INTRAVENOUS | Status: AC
  Administered 2012-07-25: 1000 mL via INTRAVENOUS

## 2012-07-25 MED ORDER — IOHEXOL 300 MG/ML  SOLN
20.0000 mL | INTRAMUSCULAR | Status: AC
  Administered 2012-07-25 (×2): 20 mL via ORAL

## 2012-07-25 MED ORDER — MORPHINE SULFATE 4 MG/ML IJ SOLN
4.0000 mg | Freq: Once | INTRAMUSCULAR | Status: AC
  Administered 2012-07-25: 4 mg via INTRAVENOUS
  Filled 2012-07-25: qty 1

## 2012-07-25 MED ORDER — OXYCODONE-ACETAMINOPHEN 5-325 MG PO TABS: 2.0000 | ORAL_TABLET | ORAL | Status: DC | PRN

## 2012-07-25 MED ORDER — ONDANSETRON HCL 4 MG PO TABS: 4.0000 mg | ORAL_TABLET | Freq: Three times a day (TID) | ORAL | Status: DC | PRN

## 2012-07-25 NOTE — ED Notes (Signed)
PA-C at bedside for evaluation.  

## 2012-07-25 NOTE — ED Notes (Signed)
Pt to CT scan via strecher

## 2012-07-25 NOTE — ED Provider Notes (Signed)
History     CSN: [id number]  Arrival date & time 07/25/12  4098   First MD Initiated Contact with Patient 07/25/12 [id number]      Chief Complaint  Patient presents with  . Abdominal Pain    (Consider location/radiation/quality/duration/timing/severity/associated sxs/prior treatment) HPI Comments: Alexandra Stein 46 y.o. female   The chief complaint is: Patient presents with:   Abdominal Pain    46 year old female with a chief complaint of right-sided abdominal pain. Presents to the ED.. she has had a history of vaginal bleeding for the past 4 weeks. She states that this bleeding began as spotting but then became heavy bleeding with clotting. She has never had a problem with bleeding before. She has no history of fibroids. Patient states that she has seen her OB/GYN, Dr. Richarda Overlie, who started her on oral contraceptive pills. She states that her bleeding has  decreased and is now spotting. States that last night. At 4 AM she awoke to pain in her abdomen. The pain starts in the right flank and radiates across the abdomen. She rates the pain at a constant 7/10. She also has shooting pains intermittently that she rates at a 10 out of 10. States that she went to her job today and double [place] to the floor. When she had when the shooting pains. She has also had 2 days of nausea and soft. Stools with increased frequency, but no diarrhea, no vomiting. No shivering no fevers. She is sexually active in a monogamous relationship for the past 25 years. He denies any urinary symptoms. She denies any leg swelling, chest pain, shortness of breath. No personal history of kidney stones. No family history of kidney stone.   Patient is a 46 y.o. female presenting with abdominal pain. The history is provided by the patient. No language interpreter was used.  Abdominal Pain The primary symptoms of the illness include abdominal pain, nausea, diarrhea and vaginal bleeding. The primary symptoms of the illness  do not include fever, fatigue, shortness of breath, vomiting, hematemesis, hematochezia, dysuria or vaginal discharge. The current episode started more than 2 days ago.  The abdominal pain began 3 to 5 hours ago. The pain came on suddenly. The abdominal pain has been gradually worsening since its onset. The abdominal pain is located in the right flank, RUQ, RLQ, epigastric region and LLQ.  Symptoms associated with the illness do not include chills, constipation, hematuria, frequency or back pain.    Past Medical History  Diagnosis Date  . Bipolar 2 disorder   . Migraines   . History of syncope 2002    Vasovagal  . Hyperlipidemia     Past Surgical History  Procedure Date  . Cesarean section   . Tubal ligation     Family History  Problem Relation Age of Onset  . Dilated cardiomyopathy Mother     with ICD  . Lung cancer    . Breast cancer    . Diabetes type II Father     History  Substance Use Topics  . Smoking status: Never Smoker   . Smokeless tobacco: Not on file  . Alcohol Use: No    OB History    Grav Para Term Preterm Abortions TAB SAB Ect Mult Living                  Review of Systems  Constitutional: Negative for fever, chills and fatigue.  Respiratory: Negative for chest tightness and shortness of breath.   Cardiovascular: Negative for  chest pain, palpitations and leg swelling.  Gastrointestinal: Positive for nausea, abdominal pain and diarrhea. Negative for vomiting, constipation, blood in stool, hematochezia, abdominal distention, rectal pain and hematemesis.  Genitourinary: Positive for flank pain and vaginal bleeding. Negative for dysuria, frequency, hematuria and vaginal discharge.  Musculoskeletal: Negative for myalgias, back pain and arthralgias.  Skin: Negative for rash.  Neurological: Negative for dizziness and light-headedness.  All other systems reviewed and are negative.    Allergies  Codeine  Home Medications   Current Outpatient Rx    Name Route Sig Dispense Refill  . ARIPIPRAZOLE 10 MG PO TABS Oral Take 10 mg by mouth at bedtime.    . ASPIRIN 81 MG PO CHEW Oral Chew 81 mg by mouth at bedtime.     . DULOXETINE HCL 60 MG PO CPEP Oral Take 60 mg by mouth at bedtime.     Marland Kitchen OLANZAPINE 2.5 MG PO TABS Oral Take 2.5 mg by mouth at bedtime.    Marland Kitchen POTASSIUM CHLORIDE CRYS ER 20 MEQ PO TBCR Oral Take 20 mEq by mouth at bedtime.     . TOPIRAMATE 100 MG PO TABS Oral Take 100 mg by mouth at bedtime.       BP 125/97  Pulse 84  Temp 98.5 F (36.9 C) (Oral)  Resp 18  Ht 5\' 3"  (1.6 m)  Wt 232 lb (105.235 kg)  BMI 41.10 kg/m2  SpO2 100%  LMP 05/31/2012  Physical Exam  Vitals reviewed. Constitutional: She is oriented to person, place, and time. She appears well-developed and well-nourished. No distress.  HENT:  Head: Normocephalic and atraumatic.  Eyes: Conjunctivae normal are normal. No scleral icterus.  Neck: Normal range of motion.  Cardiovascular: Normal rate, regular rhythm and normal heart sounds.  Exam reveals no gallop and no friction rub.   No murmur heard. Pulmonary/Chest: Effort normal and breath sounds normal. No respiratory distress.  Abdominal: Soft. Bowel sounds are normal. She exhibits no distension, no fluid wave, no ascites, no pulsatile midline mass and no mass. There is no hepatosplenomegaly. There is tenderness in the right upper quadrant, right lower quadrant, epigastric area, periumbilical area, suprapubic area and left lower quadrant. There is no rebound, no guarding, no CVA tenderness, no tenderness at McBurney's point and negative Murphy's sign. No hernia.  Genitourinary: Pelvic exam was performed with patient supine. There is no rash, tenderness, lesion or injury on the right labia. There is no rash, tenderness, lesion or injury on the left labia. Cervix exhibits no motion tenderness and no discharge. Right adnexum displays tenderness. Right adnexum displays no mass and no fullness. Left adnexum displays  tenderness. Left adnexum displays no mass and no fullness. There is bleeding around the vagina. No vaginal discharge found.       Multiparous cervix. There is dark brown blood appears old in the vaginal vault. No signs of discharge. No cervical motion tenderness. Tender to palpation of uterus into the right adnexal area  Neurological: She is alert and oriented to person, place, and time.  Skin: Skin is warm and dry. She is not diaphoretic.    ED Course  Procedures (including critical care time)  Results for orders placed during the hospital encounter of 07/25/12  CBC WITH DIFFERENTIAL      Component Value Range   WBC 7.8  4.0 - 10.5 K/uL   RBC 4.17  3.87 - 5.11 MIL/uL   Hemoglobin 12.2  12.0 - 15.0 g/dL   HCT 16.1  09.6 - 04.5 %  MCV 89.0  78.0 - 100.0 fL   MCH 29.3  26.0 - 34.0 pg   MCHC 32.9  30.0 - 36.0 g/dL   RDW 16.1  09.6 - 04.5 %   Platelets 280  150 - 400 K/uL   Neutrophils Relative 67  43 - 77 %   Neutro Abs 5.2  1.7 - 7.7 K/uL   Lymphocytes Relative 27  12 - 46 %   Lymphs Abs 2.1  0.7 - 4.0 K/uL   Monocytes Relative 4  3 - 12 %   Monocytes Absolute 0.3  0.1 - 1.0 K/uL   Eosinophils Relative 2  0 - 5 %   Eosinophils Absolute 0.1  0.0 - 0.7 K/uL   Basophils Relative 0  0 - 1 %   Basophils Absolute 0.0  0.0 - 0.1 K/uL  COMPREHENSIVE METABOLIC PANEL      Component Value Range   Sodium 139  135 - 145 mEq/L   Potassium 3.9  3.5 - 5.1 mEq/L   Chloride 107  96 - 112 mEq/L   CO2 20  19 - 32 mEq/L   Glucose, Bld 96  70 - 99 mg/dL   BUN 16  6 - 23 mg/dL   Creatinine, Ser 4.09  0.50 - 1.10 mg/dL   Calcium 9.4  8.4 - 81.1 mg/dL   Total Protein 7.1  6.0 - 8.3 g/dL   Albumin 3.6  3.5 - 5.2 g/dL   AST 20  0 - 37 U/L   ALT 26  0 - 35 U/L   Alkaline Phosphatase 94  39 - 117 U/L   Total Bilirubin 0.3  0.3 - 1.2 mg/dL   GFR calc non Af Amer 82 (*) >90 mL/min   GFR calc Af Amer >90  >90 mL/min  URINALYSIS, ROUTINE W REFLEX MICROSCOPIC      Component Value Range   Color, Urine  YELLOW  YELLOW   APPearance CLOUDY (*) CLEAR   Specific Gravity, Urine 1.019  1.005 - 1.030   pH 5.0  5.0 - 8.0   Glucose, UA NEGATIVE  NEGATIVE mg/dL   Hgb urine dipstick LARGE (*) NEGATIVE   Bilirubin Urine NEGATIVE  NEGATIVE   Ketones, ur NEGATIVE  NEGATIVE mg/dL   Protein, ur NEGATIVE  NEGATIVE mg/dL   Urobilinogen, UA 0.2  0.0 - 1.0 mg/dL   Nitrite NEGATIVE  NEGATIVE   Leukocytes, UA SMALL (*) NEGATIVE  OCCULT BLOOD, POC DEVICE      Component Value Range   Fecal Occult Bld NEGATIVE    POCT PREGNANCY, URINE      Component Value Range   Preg Test, Ur NEGATIVE  NEGATIVE  URINE MICROSCOPIC-ADD ON      Component Value Range   Squamous Epithelial / LPF FEW (*) RARE   WBC, UA 3-6  <3 WBC/hpf   RBC / HPF 11-20  <3 RBC/hpf   Bacteria, UA FEW (*) RARE   Urine-Other MUCOUS PRESENT    WET PREP, GENITAL      Component Value Range   Yeast Wet Prep HPF POC NONE SEEN  NONE SEEN   Trich, Wet Prep NONE SEEN  NONE SEEN   Clue Cells Wet Prep HPF POC NONE SEEN  NONE SEEN   WBC, Wet Prep HPF POC FEW (*) NONE SEEN    Hemoglobin is normal. A rule out fibroids versus ovarian torsion versus, kidney stone. UA may reflect kidney stone as there are leukocytes , and large hemoglobin. May also reflect, vaginal bleeding,  and contaminated urine.  Patient's pain is being controlled with morphine. Blood pressure is on the lower side at this point. Was 98/56 in the room on the screen. A bolus her with fluids and I will get an abdominal CT exam.  Ct Abdomen Pelvis W Contrast  07/25/2012  *RADIOLOGY REPORT*  Clinical Data: Abdominal pain  CT ABDOMEN AND PELVIS WITH CONTRAST  Technique:  Multidetector CT imaging of the abdomen and pelvis was performed following the standard protocol during bolus administration of intravenous contrast.  Contrast: OMNIPAQUE IOHEXOL 300 MG/ML  SOLN  Comparison: None  Findings:  The lung bases appear clear.  No pericardial or pleural effusion.  No suspicious liver abnormalities  identified.  Stones are noted within the lumen of the gallbladder which measure up to 1.5 cm.  No secondary signs of acute cholecystitis.  No biliary dilatation. The pancreas is unremarkable.  Normal appearance of the spleen.  Both adrenal glands are normal.  The urinary bladder is normal.  The uterus is unremarkable.  The adnexal structures are negative.  There is no enlarged upper abdominal lymph nodes.  There are no enlarged pelvic or inguinal lymph nodes.  Normal appearance of the stomach.  The small bowel loops have a normal course and caliber without evidence for obstruction.  The appendix is visualized and appears normal.  The colon is unremarkable.  Review of the visualized bony structures is unremarkable  IMPRESSION:  1.  No acute findings within the abdomen or pelvis. 2.  Gallstones.   Original Report Authenticated By: Rosealee Albee, M.D.    Findings on CT scan show gallstones. This may be accounting for her nausea, soft. Stools, and right upper quadrant pain. No evidence of cholecystitis. This is likely symptomatic gallstones and biliary colic. I will have the patient discharged with anti-emetic and pain control.follow up with surgery.   1. Gallstones   2. Biliary colic symptom       MDM   Patient will followup with surgery and she will also followup with OB/GYN. She has an appointment next Thursday for vaginal bleeding. Patient expresses understanding and agrees with plan. Plan. All questions answered fully. Blood pressure is 103/82 in exam room on screen. Discussed reasons to seek immediate care. Patient expresses understanding and agrees with plan.       Arthor Captain, PA-C 07/25/12 1544  Arthor Captain, PA-C 07/25/12 1610

## 2012-07-25 NOTE — ED Notes (Signed)
Pt alert x4 ambulates without distress. 

## 2012-07-25 NOTE — ED Notes (Signed)
Pt presents with R sided abdominal pain that woke her from sleep this morning.  Pt is being seen by OB/GYN for vaginal bleeding x 4 weeks, has been taking bcp with lessened bleeding.  Pt reports today, the abdominal pain began at 0400, is constant but increases in 'waves".  +nausea today and diarrhea yesterday.

## 2012-07-25 NOTE — ED Notes (Signed)
Patient resting quietly, calm with unlabored respirations.  

## 2012-07-26 ENCOUNTER — Encounter (HOSPITAL_COMMUNITY): Payer: Self-pay | Admitting: Emergency Medicine

## 2012-07-26 ENCOUNTER — Emergency Department (HOSPITAL_COMMUNITY): Payer: BC Managed Care – PPO

## 2012-07-26 ENCOUNTER — Inpatient Hospital Stay (HOSPITAL_COMMUNITY)
Admission: EM | Admit: 2012-07-26 | Discharge: 2012-07-28 | DRG: 494 | Disposition: A | Discharge status: Home / Self Care | Payer: BC Managed Care – PPO | Attending: General Surgery | Admitting: General Surgery

## 2012-07-26 DIAGNOSIS — K8020 Calculus of gallbladder without cholecystitis without obstruction: Secondary | ICD-10-CM | POA: Diagnosis present

## 2012-07-26 DIAGNOSIS — K8010 Calculus of gallbladder with chronic cholecystitis without obstruction: Secondary | ICD-10-CM

## 2012-07-26 DIAGNOSIS — F319 Bipolar disorder, unspecified: Secondary | ICD-10-CM | POA: Insufficient documentation

## 2012-07-26 LAB — COMPREHENSIVE METABOLIC PANEL
Albumin: 3.4 g/dL — ABNORMAL LOW (ref 3.5–5.2)
BUN: 12 mg/dL (ref 6–23)
Chloride: 106 mEq/L (ref 96–112)
Creatinine, Ser: 0.89 mg/dL (ref 0.50–1.10)
GFR calc Af Amer: 89 mL/min — ABNORMAL LOW (ref >90)
GFR calc non Af Amer: 77 mL/min — ABNORMAL LOW (ref >90)
Glucose, Bld: 95 mg/dL (ref 70–99)
Total Bilirubin: 0.4 mg/dL (ref 0.3–1.2)

## 2012-07-26 LAB — URINALYSIS, ROUTINE W REFLEX MICROSCOPIC
Bilirubin Urine: NEGATIVE
Ketones, ur: NEGATIVE mg/dL
Nitrite: NEGATIVE
Protein, ur: NEGATIVE mg/dL
Urobilinogen, UA: 1 mg/dL (ref 0.0–1.0)

## 2012-07-26 LAB — CBC WITH DIFFERENTIAL/PLATELET
Basophils Relative: 0 % (ref 0–1)
HCT: 34.5 % — ABNORMAL LOW (ref 36.0–46.0)
Hemoglobin: 11.5 g/dL — ABNORMAL LOW (ref 12.0–15.0)
Lymphs Abs: 1.8 10*3/uL (ref 0.7–4.0)
MCH: 29.2 pg (ref 26.0–34.0)
MCHC: 33.3 g/dL (ref 30.0–36.0)
Monocytes Absolute: 0.3 10*3/uL (ref 0.1–1.0)
Monocytes Relative: 5 % (ref 3–12)
Neutro Abs: 4.3 10*3/uL (ref 1.7–7.7)

## 2012-07-26 LAB — LIPASE, BLOOD: Lipase: 34 U/L (ref 11–59)

## 2012-07-26 MED ORDER — HYDROMORPHONE HCL PF 1 MG/ML IJ SOLN
1.0000 mg | Freq: Once | INTRAMUSCULAR | Status: AC
  Administered 2012-07-26: 1 mg via INTRAVENOUS
  Filled 2012-07-26: qty 1

## 2012-07-26 MED ORDER — CEFAZOLIN SODIUM-DEXTROSE 2-3 GM-% IV SOLR: 2.0000 g | INTRAVENOUS | Status: DC

## 2012-07-26 MED ORDER — ONDANSETRON HCL 4 MG/2ML IJ SOLN
4.0000 mg | Freq: Once | INTRAMUSCULAR | Status: AC
  Administered 2012-07-26: 4 mg via INTRAVENOUS
  Filled 2012-07-26: qty 2

## 2012-07-26 MED ORDER — OLANZAPINE 2.5 MG PO TABS
2.5000 mg | ORAL_TABLET | Freq: Every day | ORAL | Status: DC
  Administered 2012-07-26 – 2012-07-27 (×2): 2.5 mg via ORAL
  Filled 2012-07-26 (×3): qty 1

## 2012-07-26 MED ORDER — TOPIRAMATE 100 MG PO TABS
100.0000 mg | ORAL_TABLET | Freq: Every day | ORAL | Status: DC
  Administered 2012-07-26 – 2012-07-27 (×2): 100 mg via ORAL
  Filled 2012-07-26 (×3): qty 1

## 2012-07-26 MED ORDER — DULOXETINE HCL 60 MG PO CPEP
60.0000 mg | ORAL_CAPSULE | Freq: Every day | ORAL | Status: DC
  Administered 2012-07-26 – 2012-07-27 (×2): 60 mg via ORAL
  Filled 2012-07-26 (×3): qty 1

## 2012-07-26 MED ORDER — OXYCODONE HCL 5 MG PO TABS
5.0000 mg | ORAL_TABLET | ORAL | Status: DC | PRN
  Administered 2012-07-27 – 2012-07-28 (×2): 5 mg via ORAL
  Filled 2012-07-26 (×2): qty 1

## 2012-07-26 MED ORDER — ARIPIPRAZOLE 10 MG PO TABS
10.0000 mg | ORAL_TABLET | Freq: Every day | ORAL | Status: DC
  Administered 2012-07-26 – 2012-07-27 (×2): 10 mg via ORAL
  Filled 2012-07-26 (×3): qty 1

## 2012-07-26 MED ORDER — HYDROMORPHONE HCL PF 1 MG/ML IJ SOLN
1.0000 mg | INTRAMUSCULAR | Status: DC | PRN
  Administered 2012-07-27 (×2): 1 mg via INTRAVENOUS
  Filled 2012-07-26 (×2): qty 1

## 2012-07-26 MED ORDER — KETOROLAC TROMETHAMINE 30 MG/ML IJ SOLN: 30.0000 mg | Freq: Three times a day (TID) | INTRAMUSCULAR | Status: AC | PRN

## 2012-07-26 MED ORDER — ONDANSETRON HCL 4 MG/2ML IJ SOLN
INTRAMUSCULAR | Status: AC
  Administered 2012-07-26: 19:00:00
  Filled 2012-07-26: qty 2

## 2012-07-26 MED ORDER — ACETAMINOPHEN 650 MG RE SUPP: 650.0000 mg | Freq: Four times a day (QID) | RECTAL | Status: DC | PRN

## 2012-07-26 MED ORDER — ONDANSETRON HCL 4 MG/2ML IJ SOLN: 4.0000 mg | Freq: Four times a day (QID) | INTRAMUSCULAR | Status: DC | PRN

## 2012-07-26 MED ORDER — SODIUM CHLORIDE 0.9 % IV BOLUS (SEPSIS)
1000.0000 mL | Freq: Once | INTRAVENOUS | Status: AC
  Administered 2012-07-26: 1000 mL via INTRAVENOUS

## 2012-07-26 MED ORDER — ACETAMINOPHEN 325 MG PO TABS: 650.0000 mg | ORAL_TABLET | Freq: Four times a day (QID) | ORAL | Status: DC | PRN

## 2012-07-26 MED ORDER — PANTOPRAZOLE SODIUM 40 MG IV SOLR
40.0000 mg | Freq: Every day | INTRAVENOUS | Status: DC
  Administered 2012-07-26 – 2012-07-27 (×2): 40 mg via INTRAVENOUS
  Filled 2012-07-26 (×3): qty 40

## 2012-07-26 MED ORDER — POTASSIUM CHLORIDE IN NACL 20-0.9 MEQ/L-% IV SOLN
INTRAVENOUS | Status: DC
  Administered 2012-07-26 – 2012-07-28 (×4): via INTRAVENOUS
  Filled 2012-07-26 (×5): qty 1000

## 2012-07-26 NOTE — H&P (Signed)
Alexandra Stein 1966/04/21  [id number].   Requesting MD: Dr. Jeraldine Loots Chief Complaint/Reason for Consult: gallstones HPI: This is a 46 yo female who woke up yesterday morning at 0400am with RUQ abdominal pain with nausea and vomiting.  She went to [facility] yesterday and had a CT scan that revealed gallstones but no evidence of cholecystitis.  She was sent home with percocet, but this did not control her pain and she came back to [facility].  She then had an abdominal US which revealed some slight wall thickening, but all labs were normal.  Her pain has persisted and we were asked to see for possible admission.  Review of Systems: please see HPI, otherwise all other systems are negative.  Family History  Problem Relation Age of Onset  . Dilated cardiomyopathy Mother     with ICD  . Lung cancer    . Breast cancer    . Diabetes type II Father     Past Medical History  Diagnosis Date  . Bipolar 2 disorder   . Migraines   . History of syncope 2002    Vasovagal  . Hyperlipidemia     Past Surgical History  Procedure Date  . Cesarean section   . Tubal ligation     Social History:  reports that she has never smoked. She has never used smokeless tobacco. She reports that she does not drink alcohol or use illicit drugs.  Allergies:  Allergies  Allergen Reactions  . Codeine Other (See Comments)    Hallucinations and fever.      (Not in a hospital admission)  Blood pressure 104/54, pulse 81, temperature 98.1 F (36.7 C), resp. rate 20, height 5\' 3"  (1.6 m), weight 230 lb (104.327 kg), last menstrual period 06/27/2012, SpO2 99.00%. Physical Exam: General: pleasant, WD, WN white female who is laying in bed in NAD HEENT: head is normocephalic, atraumatic.  Sclera are noninjected.  PERRL.  Ears and nose without any masses or lesions.  Mouth is pink and moist Heart: regular, rate, and rhythm.  Normal s1,s2. No obvious murmurs, gallops, or rubs noted.  Palpable radial and pedal pulses  bilaterally Lungs: CTAB, no wheezes, rhonchi, or rales noted.  Respiratory effort nonlabored Abd: soft, minimally tender in RUQ, ND, +BS, no masses, hernias, or organomegaly MS: all 4 extremities are symmetrical with no cyanosis, clubbing, or edema. Skin: warm and dry with no masses, lesions, or rashes Psych: A&Ox3 with an appropriate affect.    Results for orders placed during the hospital encounter of 07/26/12 (from the past 48 hour(s))  URINALYSIS, ROUTINE W REFLEX MICROSCOPIC     Status: Abnormal   Collection Time   07/26/12  1:50 PM      Component Value Range Comment   Color, Urine YELLOW  YELLOW    APPearance CLEAR  CLEAR    Specific Gravity, Urine 1.013  1.005 - 1.030    pH 7.0  5.0 - 8.0    Glucose, UA NEGATIVE  NEGATIVE mg/dL    Hgb urine dipstick MODERATE (*) NEGATIVE    Bilirubin Urine NEGATIVE  NEGATIVE    Ketones, ur NEGATIVE  NEGATIVE mg/dL    Protein, ur NEGATIVE  NEGATIVE mg/dL    Urobilinogen, UA 1.0  0.0 - 1.0 mg/dL    Nitrite NEGATIVE  NEGATIVE    Leukocytes, UA TRACE (*) NEGATIVE   URINE MICROSCOPIC-ADD ON     Status: Abnormal   Collection Time   07/26/12  1:50 PM      Component  Value Range Comment   Squamous Epithelial / LPF FEW (*) RARE    WBC, UA 0-2  <3 WBC/hpf    RBC / HPF 0-2  <3 RBC/hpf   CBC WITH DIFFERENTIAL     Status: Abnormal   Collection Time   07/26/12  2:10 PM      Component Value Range Comment   WBC 6.5  4.0 - 10.5 K/uL    RBC 3.94  3.87 - 5.11 MIL/uL    Hemoglobin 11.5 (*) 12.0 - 15.0 g/dL    HCT 04.5 (*) 40.9 - 46.0 %    MCV 87.6  78.0 - 100.0 fL    MCH 29.2  26.0 - 34.0 pg    MCHC 33.3  30.0 - 36.0 g/dL    RDW 81.1  91.4 - 78.2 %    Platelets 306  150 - 400 K/uL    Neutrophils Relative 66  43 - 77 %    Neutro Abs 4.3  1.7 - 7.7 K/uL    Lymphocytes Relative 27  12 - 46 %    Lymphs Abs 1.8  0.7 - 4.0 K/uL    Monocytes Relative 5  3 - 12 %    Monocytes Absolute 0.3  0.1 - 1.0 K/uL    Eosinophils Relative 1  0 - 5 %    Eosinophils  Absolute 0.1  0.0 - 0.7 K/uL    Basophils Relative 0  0 - 1 %    Basophils Absolute 0.0  0.0 - 0.1 K/uL   COMPREHENSIVE METABOLIC PANEL     Status: Abnormal   Collection Time   07/26/12  2:10 PM      Component Value Range Comment   Sodium 137  135 - 145 mEq/L    Potassium 3.9  3.5 - 5.1 mEq/L SLIGHT HEMOLYSIS   Chloride 106  96 - 112 mEq/L    CO2 22  19 - 32 mEq/L    Glucose, Bld 95  70 - 99 mg/dL    BUN 12  6 - 23 mg/dL    Creatinine, Ser 9.56  0.50 - 1.10 mg/dL    Calcium 9.2  8.4 - 21.3 mg/dL    Total Protein 6.8  6.0 - 8.3 g/dL    Albumin 3.4 (*) 3.5 - 5.2 g/dL    AST 33  0 - 37 U/L    ALT 24  0 - 35 U/L    Alkaline Phosphatase 88  39 - 117 U/L    Total Bilirubin 0.4  0.3 - 1.2 mg/dL    GFR calc non Af Amer 77 (*) >90 mL/min    GFR calc Af Amer 89 (*) >90 mL/min   LIPASE, BLOOD     Status: Normal   Collection Time   07/26/12  2:10 PM      Component Value Range Comment   Lipase 34  11 - 59 U/L    US Abdomen Complete  07/26/2012  *RADIOLOGY REPORT*  Clinical Data:  Abdominal pain.  Cholelithiasis.  COMPLETE ABDOMINAL ULTRASOUND  Comparison:  CT scan dated 07/25/2012  Findings:  Gallbladder:  There are several large stones in the gallbladder. The gallbladder wall is slightly thickened.  Murphy's sign cannot be assessed because the patient was given pain medication.  Common bile duct:  Normal.  5.1 mm in diameter.  Liver:  Normal.  IVC:  Normal.  Pancreas:  The body and head of the pancreas are normal.  The distal tail is obscured by bowel gas.  Spleen:  Normal.  6.3 cm in length.  Right Kidney:  Normal.  10.4 cm in length.  Left Kidney:  Normal.  0.8 cm in length.  Abdominal aorta:  Normal.  2.3 cm maximum diameter.  IMPRESSION: Cholelithiasis with slight thickening of the gallbladder wall which could be seen with acute or chronic cholecystitis. Nonvisualization of the tail of the pancreas.  However, CT scan performed on this same date demonstrates that the pancreas is normal.   Original  Report Authenticated By: Gwynn Burly, M.D.    Ct Abdomen Pelvis W Contrast  07/25/2012  *RADIOLOGY REPORT*  Clinical Data: Abdominal pain  CT ABDOMEN AND PELVIS WITH CONTRAST  Technique:  Multidetector CT imaging of the abdomen and pelvis was performed following the standard protocol during bolus administration of intravenous contrast.  Contrast: OMNIPAQUE IOHEXOL 300 MG/ML  SOLN  Comparison: None  Findings:  The lung bases appear clear.  No pericardial or pleural effusion.  No suspicious liver abnormalities identified.  Stones are noted within the lumen of the gallbladder which measure up to 1.5 cm.  No secondary signs of acute cholecystitis.  No biliary dilatation. The pancreas is unremarkable.  Normal appearance of the spleen.  Both adrenal glands are normal.  The urinary bladder is normal.  The uterus is unremarkable.  The adnexal structures are negative.  There is no enlarged upper abdominal lymph nodes.  There are no enlarged pelvic or inguinal lymph nodes.  Normal appearance of the stomach.  The small bowel loops have a normal course and caliber without evidence for obstruction.  The appendix is visualized and appears normal.  The colon is unremarkable.  Review of the visualized bony structures is unremarkable  IMPRESSION:  1.  No acute findings within the abdomen or pelvis. 2.  Gallstones.   Original Report Authenticated By: Rosealee Albee, M.D.        Assessment/Plan 1. Symptomatic cholelithiasis 2. Bipolar disorder  Plan: 1. Admit the patient and start her on some clear liquids.  She will be made npo after midnight in preparation for surgical intervention tomorrow.   2. IVFs and prn medications for pain and nausea. 3. The surgery has been d/w the patient.  She is agreeable to the plan.  Crislyn Willbanks E 07/26/2012, 4:10 PM Pager: [phone]

## 2012-07-26 NOTE — ED Provider Notes (Signed)
History     CSN: [id number]  Arrival date & time 07/26/12  1307   First MD Initiated Contact with Patient 07/26/12 1344      Chief Complaint  Patient presents with  . Cholelithiasis    (Consider location/radiation/quality/duration/timing/severity/associated sxs/prior treatment) HPI The patient presents with concerns of ongoing abdominal pain with new vomiting.  Pain is focally about the right upper quadrant, nonradiating, sharp, crampy, worse with by mouth intake.  Notably, the patient was seen yesterday, evaluated with labs and a CAT scan.  She was discharged with Percocet.  She notes since discharge her pain is improved with Percocet, and she now has vomiting as well.  No history of abdominal surgery beyond C-section.  Past Medical History  Diagnosis Date  . Bipolar 2 disorder   . Migraines   . History of syncope 2002    Vasovagal  . Hyperlipidemia     Past Surgical History  Procedure Date  . Cesarean section   . Tubal ligation     Family History  Problem Relation Age of Onset  . Dilated cardiomyopathy Mother     with ICD  . Lung cancer    . Breast cancer    . Diabetes type II Father     History  Substance Use Topics  . Smoking status: Never Smoker   . Smokeless tobacco: Not on file  . Alcohol Use: No    OB History    Grav Para Term Preterm Abortions TAB SAB Ect Mult Living                  Review of Systems  Constitutional:       HPI  HENT:       HPI otherwise negative  Eyes: Negative.   Respiratory:       HPI, otherwise negative  Cardiovascular:       HPI, otherwise nmegative  Gastrointestinal: Positive for nausea and vomiting. Negative for diarrhea and constipation.  Genitourinary:       HPI, otherwise negative  Musculoskeletal:       HPI, otherwise negative  Skin: Negative.   Neurological: Negative for syncope.    Allergies  Codeine  Home Medications   Current Outpatient Rx  Name Route Sig Dispense Refill  . ARIPIPRAZOLE 10 MG PO  TABS Oral Take 10 mg by mouth at bedtime.    . ASPIRIN 81 MG PO CHEW Oral Chew 81 mg by mouth at bedtime.     . DULOXETINE HCL 60 MG PO CPEP Oral Take 60 mg by mouth at bedtime.     Marland Kitchen OLANZAPINE 2.5 MG PO TABS Oral Take 2.5 mg by mouth at bedtime.    Marland Kitchen ONDANSETRON HCL 4 MG PO TABS Oral Take 4 mg by mouth every 8 (eight) hours as needed. Nausea    . OXYCODONE-ACETAMINOPHEN 5-325 MG PO TABS Oral Take 2 tablets by mouth every 4 (four) hours as needed for pain. 20 tablet 0  . POTASSIUM CHLORIDE CRYS ER 20 MEQ PO TBCR Oral Take 20 mEq by mouth at bedtime.     . TOPIRAMATE 100 MG PO TABS Oral Take 100 mg by mouth at bedtime.       BP 104/54  Pulse 81  Temp 98.1 F (36.7 C)  Resp 20  Ht 5\' 3"  (1.6 m)  Wt 230 lb (104.327 kg)  BMI 40.74 kg/m2  SpO2 99%  LMP 06/27/2012  Physical Exam  ED Course  Procedures (including critical care time)  Labs Reviewed  CBC WITH DIFFERENTIAL - Abnormal; Notable for the following:    Hemoglobin 11.5 (*)     HCT 34.5 (*)     All other components within normal limits  URINALYSIS, ROUTINE W REFLEX MICROSCOPIC - Abnormal; Notable for the following:    Hgb urine dipstick MODERATE (*)     Leukocytes, UA TRACE (*)     All other components within normal limits  URINE MICROSCOPIC-ADD ON - Abnormal; Notable for the following:    Squamous Epithelial / LPF FEW (*)     All other components within normal limits  COMPREHENSIVE METABOLIC PANEL  LIPASE, BLOOD   Ct Abdomen Pelvis W Contrast  07/25/2012  *RADIOLOGY REPORT*  Clinical Data: Abdominal pain  CT ABDOMEN AND PELVIS WITH CONTRAST  Technique:  Multidetector CT imaging of the abdomen and pelvis was performed following the standard protocol during bolus administration of intravenous contrast.  Contrast: OMNIPAQUE IOHEXOL 300 MG/ML  SOLN  Comparison: None  Findings:  The lung bases appear clear.  No pericardial or pleural effusion.  No suspicious liver abnormalities identified.  Stones are noted within the  lumen of the gallbladder which measure up to 1.5 cm.  No secondary signs of acute cholecystitis.  No biliary dilatation. The pancreas is unremarkable.  Normal appearance of the spleen.  Both adrenal glands are normal.  The urinary bladder is normal.  The uterus is unremarkable.  The adnexal structures are negative.  There is no enlarged upper abdominal lymph nodes.  There are no enlarged pelvic or inguinal lymph nodes.  Normal appearance of the stomach.  The small bowel loops have a normal course and caliber without evidence for obstruction.  The appendix is visualized and appears normal.  The colon is unremarkable.  Review of the visualized bony structures is unremarkable  IMPRESSION:  1.  No acute findings within the abdomen or pelvis. 2.  Gallstones.   Original Report Authenticated By: Rosealee Albee, M.D.      No diagnosis found.    MDM  This 46 year old female presents for second time in consecutive days with concerns of ongoing abdominal pain.  Notably, the patient now completely vomiting, increasing anorexia, and pain not responsive to Percocet.  On exam she has a Murphy sign.  The patient's ultrasound demonstrates a bladder wall thickening or.  Although she is afebrile with no leukocytosis there is concern for 40 status.  As discussed with the general surgery service who will consult on the patient.    Gerhard Munch, MD 07/26/12 1536

## 2012-07-26 NOTE — ED Notes (Signed)
Report called  

## 2012-07-26 NOTE — ED Notes (Signed)
Patient was diagnosed with gall stones yesterday at [facility].  Patient was instructed to follow up with surgeon.  Patient is still having nausea and RUQ abdominal pain of 7/10.  Patient denies fevers.

## 2012-07-26 NOTE — ED Notes (Signed)
Bed:WA08<BR> Expected date:<BR> Expected time:<BR> Means of arrival:<BR> Comments:<BR> Triage 3

## 2012-07-26 NOTE — Progress Notes (Signed)
Patient ID: Alexandra Stein, female   DOB: Sep 12, 1966, 46 y.o.   MRN: [id number]  I agree with the assessment and plans. This is a patient with systematic cholelithiasis. She will be admitted to the hospital at all proceed with left-sided cholecystectomy tomorrow. I discussed the procedure with her in detail. I discussed the risk which includes but is not limited to bleeding, infection, bile duct injury, bile leak, injury to other structures, need to convert to an open procedure, et Karie Soda. She understands and wishes to proceed. She'll be admitted and surgery will be planned for tomorrow.

## 2012-07-27 ENCOUNTER — Ambulatory Visit (INDEPENDENT_AMBULATORY_CARE_PROVIDER_SITE_OTHER): Payer: BC Managed Care – PPO | Admitting: General Surgery

## 2012-07-27 ENCOUNTER — Encounter (HOSPITAL_COMMUNITY): Admission: EM | Disposition: A | Discharge status: Home / Self Care | Payer: Self-pay | Source: Home / Self Care

## 2012-07-27 ENCOUNTER — Encounter (HOSPITAL_COMMUNITY): Payer: Self-pay | Admitting: Anesthesiology

## 2012-07-27 ENCOUNTER — Inpatient Hospital Stay (HOSPITAL_COMMUNITY): Payer: BC Managed Care – PPO | Admitting: Anesthesiology

## 2012-07-27 HISTORY — PX: CHOLECYSTECTOMY: SHX55

## 2012-07-27 SURGERY — LAPAROSCOPIC CHOLECYSTECTOMY
Anesthesia: General | Site: Abdomen | Wound class: Contaminated

## 2012-07-27 MED ORDER — DEXAMETHASONE SODIUM PHOSPHATE 10 MG/ML IJ SOLN
INTRAMUSCULAR | Status: DC | PRN
  Administered 2012-07-27: 10 mg via INTRAVENOUS

## 2012-07-27 MED ORDER — NEOSTIGMINE METHYLSULFATE 1 MG/ML IJ SOLN
INTRAMUSCULAR | Status: DC | PRN
  Administered 2012-07-27: 5 mg via INTRAVENOUS

## 2012-07-27 MED ORDER — GLYCOPYRROLATE 0.2 MG/ML IJ SOLN
INTRAMUSCULAR | Status: DC | PRN
  Administered 2012-07-27: .6 mg via INTRAVENOUS

## 2012-07-27 MED ORDER — HYDROMORPHONE HCL PF 1 MG/ML IJ SOLN: 0.2500 mg | INTRAMUSCULAR | Status: DC | PRN

## 2012-07-27 MED ORDER — MIDAZOLAM HCL 5 MG/5ML IJ SOLN
INTRAMUSCULAR | Status: DC | PRN
  Administered 2012-07-27: 2 mg via INTRAVENOUS

## 2012-07-27 MED ORDER — CEFAZOLIN SODIUM-DEXTROSE 2-3 GM-% IV SOLR
INTRAVENOUS | Status: AC
  Filled 2012-07-27: qty 50

## 2012-07-27 MED ORDER — LIDOCAINE HCL (CARDIAC) 20 MG/ML IV SOLN
INTRAVENOUS | Status: DC | PRN
  Administered 2012-07-27: 100 mg via INTRAVENOUS

## 2012-07-27 MED ORDER — SODIUM CHLORIDE 0.9 % IR SOLN
Status: DC | PRN
  Administered 2012-07-27: 500 mL

## 2012-07-27 MED ORDER — LACTATED RINGERS IV SOLN
INTRAVENOUS | Status: DC
  Administered 2012-07-27: 1000 mL via INTRAVENOUS
  Administered 2012-07-27: 14:00:00 via INTRAVENOUS

## 2012-07-27 MED ORDER — ACETAMINOPHEN 10 MG/ML IV SOLN
INTRAVENOUS | Status: DC | PRN
  Administered 2012-07-27: 1000 mg via INTRAVENOUS

## 2012-07-27 MED ORDER — PROMETHAZINE HCL 25 MG/ML IJ SOLN: 6.2500 mg | INTRAMUSCULAR | Status: DC | PRN

## 2012-07-27 MED ORDER — BUPIVACAINE HCL (PF) 0.5 % IJ SOLN
INTRAMUSCULAR | Status: DC | PRN
  Administered 2012-07-27: 20 mL

## 2012-07-27 MED ORDER — ACETAMINOPHEN 10 MG/ML IV SOLN
INTRAVENOUS | Status: AC
  Filled 2012-07-27: qty 100

## 2012-07-27 MED ORDER — PROPOFOL 10 MG/ML IV EMUL
INTRAVENOUS | Status: DC | PRN
  Administered 2012-07-27: 200 mg via INTRAVENOUS

## 2012-07-27 MED ORDER — ONDANSETRON HCL 4 MG/2ML IJ SOLN
INTRAMUSCULAR | Status: DC | PRN
  Administered 2012-07-27: 4 mg via INTRAVENOUS

## 2012-07-27 MED ORDER — MIDAZOLAM HCL 2 MG/2ML IJ SOLN
INTRAMUSCULAR | Status: AC
  Filled 2012-07-27: qty 2

## 2012-07-27 MED ORDER — CEFAZOLIN SODIUM-DEXTROSE 2-3 GM-% IV SOLR
2.0000 g | INTRAVENOUS | Status: AC
  Administered 2012-07-27: 2 g via INTRAVENOUS

## 2012-07-27 MED ORDER — SUCCINYLCHOLINE CHLORIDE 20 MG/ML IJ SOLN
INTRAMUSCULAR | Status: DC | PRN
  Administered 2012-07-27: 100 mg via INTRAVENOUS

## 2012-07-27 MED ORDER — BUPIVACAINE HCL (PF) 0.5 % IJ SOLN
INTRAMUSCULAR | Status: AC
  Filled 2012-07-27: qty 30

## 2012-07-27 MED ORDER — ROCURONIUM BROMIDE 100 MG/10ML IV SOLN
INTRAVENOUS | Status: DC | PRN
  Administered 2012-07-27: 10 mg via INTRAVENOUS
  Administered 2012-07-27: 20 mg via INTRAVENOUS

## 2012-07-27 MED ORDER — FENTANYL CITRATE 0.05 MG/ML IJ SOLN
INTRAMUSCULAR | Status: DC | PRN
  Administered 2012-07-27 (×2): 50 ug via INTRAVENOUS
  Administered 2012-07-27: 100 ug via INTRAVENOUS
  Administered 2012-07-27: 50 ug via INTRAVENOUS

## 2012-07-27 SURGICAL SUPPLY — 32 items
APPLIER CLIP 5 13 M/L LIGAMAX5 (MISCELLANEOUS) ×3
BANDAGE ADHESIVE 1X3 (GAUZE/BANDAGES/DRESSINGS) ×12 IMPLANT
BENZOIN TINCTURE PRP APPL 2/3 (GAUZE/BANDAGES/DRESSINGS) ×3 IMPLANT
CANISTER SUCTION 2500CC (MISCELLANEOUS) ×3 IMPLANT
CHLORAPREP W/TINT 26ML (MISCELLANEOUS) ×3 IMPLANT
CLIP APPLIE 5 13 M/L LIGAMAX5 (MISCELLANEOUS) ×2 IMPLANT
CLOTH BEACON ORANGE TIMEOUT ST (SAFETY) ×3 IMPLANT
COVER MAYO STAND STRL (DRAPES) IMPLANT
DECANTER SPIKE VIAL GLASS SM (MISCELLANEOUS) ×3 IMPLANT
DRAPE C-ARM 42X72 X-RAY (DRAPES) IMPLANT
DRAPE LAPAROSCOPIC ABDOMINAL (DRAPES) ×3 IMPLANT
ELECT REM PT RETURN 9FT ADLT (ELECTROSURGICAL) ×3
ELECTRODE REM PT RTRN 9FT ADLT (ELECTROSURGICAL) ×2 IMPLANT
GLOVE BIOGEL PI IND STRL 7.0 (GLOVE) ×2 IMPLANT
GLOVE BIOGEL PI INDICATOR 7.0 (GLOVE) ×1
GLOVE SURG SIGNA 7.5 PF LTX (GLOVE) ×6 IMPLANT
GOWN STRL NON-REIN LRG LVL3 (GOWN DISPOSABLE) ×3 IMPLANT
GOWN STRL REIN XL XLG (GOWN DISPOSABLE) ×6 IMPLANT
HEMOSTAT SURGICEL 4X8 (HEMOSTASIS) IMPLANT
KIT BASIN OR (CUSTOM PROCEDURE TRAY) ×3 IMPLANT
NS IRRIG 1000ML POUR BTL (IV SOLUTION) ×3 IMPLANT
POUCH SPECIMEN RETRIEVAL 10MM (ENDOMECHANICALS) ×3 IMPLANT
SET CHOLANGIOGRAPH MIX (MISCELLANEOUS) IMPLANT
SET IRRIG TUBING LAPAROSCOPIC (IRRIGATION / IRRIGATOR) ×3 IMPLANT
SOLUTION ANTI FOG 6CC (MISCELLANEOUS) ×3 IMPLANT
STRIP CLOSURE SKIN 1/2X4 (GAUZE/BANDAGES/DRESSINGS) ×3 IMPLANT
SUT MNCRL AB 4-0 PS2 18 (SUTURE) ×6 IMPLANT
TOWEL OR 17X26 10 PK STRL BLUE (TOWEL DISPOSABLE) ×3 IMPLANT
TRAY LAP CHOLE (CUSTOM PROCEDURE TRAY) ×3 IMPLANT
TROCAR BLADELESS OPT 5 75 (ENDOMECHANICALS) ×9 IMPLANT
TROCAR XCEL BLUNT TIP 100MML (ENDOMECHANICALS) ×3 IMPLANT
TUBING INSUFFLATION 10FT LAP (TUBING) ×3 IMPLANT

## 2012-07-27 NOTE — Anesthesia Postprocedure Evaluation (Signed)
  Anesthesia Post-op Note  Patient: Alexandra Stein  Procedure(s) Performed: Procedure(s) (LRB): LAPAROSCOPIC CHOLECYSTECTOMY (N/A)  Patient Location: PACU  Anesthesia Type: General  Level of Consciousness: awake and alert   Airway and Oxygen Therapy: Patient Spontanous Breathing  Post-op Pain: mild  Post-op Assessment: Post-op Vital signs reviewed, Patient's Cardiovascular Status Stable, Respiratory Function Stable, Patent Airway and No signs of Nausea or vomiting  Post-op Vital Signs: stable  Complications: No apparent anesthesia complications

## 2012-07-27 NOTE — Interval H&P Note (Signed)
History and Physical Interval Note:  07/27/2012 12:44 PM  Alexandra Stein  has presented today for surgery, with the diagnosis of cholecystitis  The various methods of treatment have been discussed with the patient and family. After consideration of risks, benefits and other options for treatment, the patient has consented to  Procedure(s) (LRB) with comments: LAPAROSCOPIC CHOLECYSTECTOMY WITH INTRAOPERATIVE CHOLANGIOGRAM (N/A) as a surgical intervention .  The patient's history has been reviewed, patient examined, no change in status, stable for surgery.  I have reviewed the patient's chart and labs.  Questions were answered to the patient's satisfaction.     [name] A

## 2012-07-27 NOTE — Progress Notes (Signed)
I have seen and examined the patient and agree with the assessment and plans. For lap chole today  Alexandra Stein A. Magnus Ivan  MD, FACS

## 2012-07-27 NOTE — Anesthesia Preprocedure Evaluation (Addendum)
Anesthesia Evaluation  Patient identified by MRN, date of birth, ID band Patient awake    Reviewed: Allergy & Precautions, H&P , NPO status , Patient's Chart, lab work & pertinent test results  Airway Mallampati: II TM Distance: >3 FB Neck ROM: Full    Dental No notable dental hx.    Pulmonary neg pulmonary ROS,  breath sounds clear to auscultation  Pulmonary exam normal       Cardiovascular negative cardio ROS  Rhythm:Regular Rate:Normal     Neuro/Psych  Headaches, PSYCHIATRIC DISORDERS Bipolar Disorder    GI/Hepatic negative GI ROS, Neg liver ROS,   Endo/Other  Morbid obesity  Renal/GU negative Renal ROS  negative genitourinary   Musculoskeletal negative musculoskeletal ROS (+)   Abdominal   Peds negative pediatric ROS (+)  Hematology negative hematology ROS (+)   Anesthesia Other Findings   Reproductive/Obstetrics Negative pregnancy test 07/25/12                          Anesthesia Physical Anesthesia Plan  ASA: II  Anesthesia Plan: General   Post-op Pain Management:    Induction: Intravenous  Airway Management Planned: Oral ETT  Additional Equipment:   Intra-op Plan:   Post-operative Plan: Extubation in OR  Informed Consent: I have reviewed the patients History and Physical, chart, labs and discussed the procedure including the risks, benefits and alternatives for the proposed anesthesia with the patient or authorized representative who has indicated his/her understanding and acceptance.   Dental advisory given  Plan Discussed with: CRNA  Anesthesia Plan Comments:         Anesthesia Quick Evaluation

## 2012-07-27 NOTE — Op Note (Signed)
Laparoscopic Cholecystectomy Procedure Note  Indications: This patient presents with symptomatic gallbladder disease and will undergo laparoscopic cholecystectomy.  Pre-operative Diagnosis: Calculus of gallbladder without mention of cholecystitis or obstruction  Post-operative Diagnosis: Same  Surgeon: Abigail Miyamoto A   Assistants: 0  Anesthesia: General endotracheal anesthesia  ASA Class: 2  Procedure Details  The patient was seen again in the Holding Room. The risks, benefits, complications, treatment options, and expected outcomes were discussed with the patient. The possibilities of reaction to medication, pulmonary aspiration, perforation of viscus, bleeding, recurrent infection, finding a normal gallbladder, the need for additional procedures, failure to diagnose a condition, the possible need to convert to an open procedure, and creating a complication requiring transfusion or operation were discussed with the patient. The likelihood of improving the patient's symptoms with return to their baseline status is good.  The patient and/or family concurred with the proposed plan, giving informed consent. The site of surgery properly noted. The patient was taken to Operating Room, identified as Alexandra Stein and the procedure verified as Laparoscopic Cholecystectomy with Intraoperative Cholangiogram. A Time Out was held and the above information confirmed.  Prior to the induction of general anesthesia, antibiotic prophylaxis was administered. General endotracheal anesthesia was then administered and tolerated well. After the induction, the abdomen was prepped with Chloraprep and draped in sterile fashion. The patient was positioned in the supine position.  Local anesthetic agent was injected into the skin near the umbilicus and an incision made. We dissected down to the abdominal fascia with blunt dissection.  The fascia was incised vertically and we entered the peritoneal cavity bluntly.   A pursestring suture of 0-Vicryl was placed around the fascial opening.  The Hasson cannula was inserted and secured with the stay suture.  Pneumoperitoneum was then created with CO2 and tolerated well without any adverse changes in the patient's vital signs. An 11-mm port was placed in the subxiphoid position.  Two 5-mm ports were placed in the right upper quadrant. All skin incisions were infiltrated with a local anesthetic agent before making the incision and placing the trocars.   We positioned the patient in reverse Trendelenburg, tilted slightly to the patient's left.  The gallbladder was identified, the fundus grasped and retracted cephalad. Adhesions were lysed bluntly and with the electrocautery where indicated, taking care not to injure any adjacent organs or viscus. The infundibulum was grasped and retracted laterally, exposing the peritoneum overlying the triangle of Calot. This was then divided and exposed in a blunt fashion. The cystic duct was clearly identified and bluntly dissected circumferentially. A critical view of the cystic duct and cystic artery was obtained.  The cystic duct was then ligated with clips and divided. The cystic artery was, dissected free, ligated with clips and divided as well.   The gallbladder was dissected from the liver bed in retrograde fashion with the electrocautery. The gallbladder was removed and placed in an Endocatch sac. The liver bed was irrigated and inspected. Hemostasis was achieved with the electrocautery. Copious irrigation was utilized and was repeatedly aspirated until clear.  The gallbladder and Endocatch sac were then removed through the umbilical port site.  The pursestring suture was used to close the umbilical fascia.    We again inspected the right upper quadrant for hemostasis.  Pneumoperitoneum was released as we removed the trocars.  4-0 Monocryl was used to close the skin.   Benzoin, steri-strips, and clean dressings were applied. The  patient was then extubated and brought to the recovery room  in stable condition. Instrument, sponge, and needle counts were correct at closure and at the conclusion of the case.   Findings: cholelithiasis  Estimated Blood Loss: Minimal         Drains: 0         Specimens: Gallbladder           Complications: None; patient tolerated the procedure well.         Disposition: PACU - hemodynamically stable.         Condition: stable

## 2012-07-27 NOTE — Progress Notes (Signed)
Patient ID: Alexandra Stein, female   DOB: [birth year], 46 y.o.   MRN: [id number]    Subjective: Pt feels ok.  Still with mild pain.  Objective: Vital signs in last 24 hours: Temp:  [98 F (36.7 C)-98.7 F (37.1 C)] 98 F (36.7 C) (09/20 0611) Pulse Rate:  [56-81] 63  (09/20 0611) Resp:  [18-20] 18  (09/20 0611) BP: (99-126)/(54-65) 111/60 mmHg (09/20 0611) SpO2:  [96 %-100 %] 98 % (09/20 0611) Weight:  [230 lb (104.327 kg)] 230 lb (104.327 kg) (09/19 1332) Last BM Date: 07/24/12  Intake/Output from previous day: 09/19 0701 - 09/20 0700 In: 1275 [I.V.:1275] Out: 1300 [Urine:1200; Emesis/NG output:100] Intake/Output this shift:    PE: Abd: soft, minimally tender, +BS  Lab Results:   Basename 07/26/12 1410 07/25/12 1058  WBC 6.5 7.8  HGB 11.5* 12.2  HCT 34.5* 37.1  PLT 306 280   BMET  Basename 07/26/12 1410 07/25/12 1058  NA 137 139  K 3.9 3.9  CL 106 107  CO2 22 20  GLUCOSE 95 96  BUN 12 16  CREATININE 0.89 0.85  CALCIUM 9.2 9.4   PT/INR No results found for this basename: LABPROT:2,INR:2 in the last 72 hours CMP     Component Value Date/Time   NA 137 07/26/2012 1410   K 3.9 07/26/2012 1410   CL 106 07/26/2012 1410   CO2 22 07/26/2012 1410   GLUCOSE 95 07/26/2012 1410   BUN 12 07/26/2012 1410   CREATININE 0.89 07/26/2012 1410   CALCIUM 9.2 07/26/2012 1410   PROT 6.8 07/26/2012 1410   ALBUMIN 3.4* 07/26/2012 1410   AST 33 07/26/2012 1410   ALT 24 07/26/2012 1410   ALKPHOS 88 07/26/2012 1410   BILITOT 0.4 07/26/2012 1410   GFRNONAA 77* 07/26/2012 1410   GFRAA 89* 07/26/2012 1410   Lipase     Component Value Date/Time   LIPASE 34 07/26/2012 1410       Studies/Results: US Abdomen Complete  07/26/2012  *RADIOLOGY REPORT*  Clinical Data:  Abdominal pain.  Cholelithiasis.  COMPLETE ABDOMINAL ULTRASOUND  Comparison:  CT scan dated 07/25/2012  Findings:  Gallbladder:  There are several large stones in the gallbladder. The gallbladder wall is slightly thickened.   Murphy's sign cannot be assessed because the patient was given pain medication.  Common bile duct:  Normal.  5.1 mm in diameter.  Liver:  Normal.  IVC:  Normal.  Pancreas:  The body and head of the pancreas are normal.  The distal tail is obscured by bowel gas.  Spleen:  Normal.  6.3 cm in length.  Right Kidney:  Normal.  10.4 cm in length.  Left Kidney:  Normal.  0.8 cm in length.  Abdominal aorta:  Normal.  2.3 cm maximum diameter.  IMPRESSION: Cholelithiasis with slight thickening of the gallbladder wall which could be seen with acute or chronic cholecystitis. Nonvisualization of the tail of the pancreas.  However, CT scan performed on this same date demonstrates that the pancreas is normal.   Original Report Authenticated By: Gwynn Burly, M.D.    Ct Abdomen Pelvis W Contrast  07/25/2012  *RADIOLOGY REPORT*  Clinical Data: Abdominal pain  CT ABDOMEN AND PELVIS WITH CONTRAST  Technique:  Multidetector CT imaging of the abdomen and pelvis was performed following the standard protocol during bolus administration of intravenous contrast.  Contrast: OMNIPAQUE IOHEXOL 300 MG/ML  SOLN  Comparison: None  Findings:  The lung bases appear clear.  No pericardial or pleural effusion.  No suspicious liver abnormalities identified.  Stones are noted within the lumen of the gallbladder which measure up to 1.5 cm.  No secondary signs of acute cholecystitis.  No biliary dilatation. The pancreas is unremarkable.  Normal appearance of the spleen.  Both adrenal glands are normal.  The urinary bladder is normal.  The uterus is unremarkable.  The adnexal structures are negative.  There is no enlarged upper abdominal lymph nodes.  There are no enlarged pelvic or inguinal lymph nodes.  Normal appearance of the stomach.  The small bowel loops have a normal course and caliber without evidence for obstruction.  The appendix is visualized and appears normal.  The colon is unremarkable.  Review of the visualized bony structures  is unremarkable  IMPRESSION:  1.  No acute findings within the abdomen or pelvis. 2.  Gallstones.   Original Report Authenticated By: Rosealee Albee, M.D.     Anti-infectives: Anti-infectives     Start     Dose/Rate Route Frequency Ordered Stop   07/26/12 1559   ceFAZolin (ANCEF) IVPB 2 g/50 mL premix  Status:  Discontinued        2 g 100 mL/hr over 30 Minutes Intravenous 60 min pre-op 07/26/12 1559 07/26/12 1603           Assessment/Plan  1. Symptomatic cholelithiasis  Plan: 1. To OR today for lap chole. All questions answered.   LOS: 1 day    Kalim Kissel E 07/27/2012, 7:57 AM Pager: [phone]

## 2012-07-27 NOTE — Preoperative (Signed)
Beta Blockers   Reason not to administer Beta Blockers:Not Applicable 

## 2012-07-27 NOTE — Transfer of Care (Signed)
Immediate Anesthesia Transfer of Care Note  Patient: Alexandra Stein  Procedure(s) Performed: Procedure(s) (LRB) with comments: LAPAROSCOPIC CHOLECYSTECTOMY (N/A)  Patient Location: PACU  Anesthesia Type: General  Level of Consciousness: awake, alert  and oriented  Airway & Oxygen Therapy: Patient Spontanous Breathing and Patient connected to face mask oxygen  Post-op Assessment: Report given to PACU RN and Post -op Vital signs reviewed and stable  Post vital signs: Reviewed and stable  Complications: No apparent anesthesia complications

## 2012-07-27 NOTE — ED Provider Notes (Signed)
Medical screening examination/treatment/procedure(s) were performed by non-physician practitioner and as supervising physician I was immediately available for consultation/collaboration.  Geoffery Lyons, MD 07/27/12 1431

## 2012-07-28 MED ORDER — OXYCODONE HCL 5 MG PO TABS: 5.0000 mg | ORAL_TABLET | ORAL | Status: DC | PRN

## 2012-07-28 NOTE — Progress Notes (Signed)
Patient ID: Alexandra Stein, female   DOB: [birth year], 46 y.o.   MRN: [id number] 1 Day Post-Op  Subjective: Just sore, no other complaints. Tolerating liquids. Has been up walking in the halls.  Objective: Vital signs in last 24 hours: Temp:  [97.6 F (36.4 C)-98.3 F (36.8 C)] 97.9 F (36.6 C) (09/21 0630) Pulse Rate:  [62-90] 77  (09/21 0630) Resp:  [12-18] 18  (09/21 0630) BP: (100-132)/(62-75) 122/70 mmHg (09/21 0630) SpO2:  [95 %-100 %] 97 % (09/21 0630) Last BM Date: 07/24/12  Intake/Output from previous day: 09/20 0701 - 09/21 0700 In: 3600 [P.O.:1600; I.V.:2000] Out: 4625 [Urine:4600; Blood:25] Intake/Output this shift:    General appearance: alert and no distress GI: normal findings: soft, non-tender Incision/Wound: clean and dry  Lab Results:   Basename 07/26/12 1410 07/25/12 1058  WBC 6.5 7.8  HGB 11.5* 12.2  HCT 34.5* 37.1  PLT 306 280   BMET  Basename 07/26/12 1410 07/25/12 1058  NA 137 139  K 3.9 3.9  CL 106 107  CO2 22 20  GLUCOSE 95 96  BUN 12 16  CREATININE 0.89 0.85  CALCIUM 9.2 9.4     Studies/Results: US Abdomen Complete  07/26/2012  *RADIOLOGY REPORT*  Clinical Data:  Abdominal pain.  Cholelithiasis.  COMPLETE ABDOMINAL ULTRASOUND  Comparison:  CT scan dated 07/25/2012  Findings:  Gallbladder:  There are several large stones in the gallbladder. The gallbladder wall is slightly thickened.  Murphy's sign cannot be assessed because the patient was given pain medication.  Common bile duct:  Normal.  5.1 mm in diameter.  Liver:  Normal.  IVC:  Normal.  Pancreas:  The body and head of the pancreas are normal.  The distal tail is obscured by bowel gas.  Spleen:  Normal.  6.3 cm in length.  Right Kidney:  Normal.  10.4 cm in length.  Left Kidney:  Normal.  0.8 cm in length.  Abdominal aorta:  Normal.  2.3 cm maximum diameter.  IMPRESSION: Cholelithiasis with slight thickening of the gallbladder wall which could be seen with acute or chronic  cholecystitis. Nonvisualization of the tail of the pancreas.  However, CT scan performed on this same date demonstrates that the pancreas is normal.   Original Report Authenticated By: Gwynn Burly, M.D.     Anti-infectives: Anti-infectives     Start     Dose/Rate Route Frequency Ordered Stop   07/27/12 1230   ceFAZolin (ANCEF) IVPB 2 g/50 mL premix        2 g 100 mL/hr over 30 Minutes Intravenous 60 min pre-op 07/27/12 1230 07/27/12 1314   07/26/12 1559   ceFAZolin (ANCEF) IVPB 2 g/50 mL premix  Status:  Discontinued        2 g 100 mL/hr over 30 Minutes Intravenous 60 min pre-op 07/26/12 1559 07/26/12 1603          Assessment/Plan: s/p Procedure(s): LAPAROSCOPIC CHOLECYSTECTOMY Doing well postoperatively without apparent complication. She is okay for discharge today.   LOS: 2 days    Mashell Sieben T 07/28/2012

## 2012-07-28 NOTE — Progress Notes (Signed)
Assessment unchanged. IVs removed. Discussed D/C instructions with pt and family. Pt verbalized understanding. Paperwork and Rx given to pt. Pt left via W/C accompanied by NT and family.

## 2012-07-28 NOTE — Plan of Care (Signed)
Problem: Phase II Progression Outcomes Goal: Return of bowel function (flatus, BM) IF ABDOMINAL SURGERY:  Outcome: Progressing Active BS. No flatus or BM. MD aware.

## 2012-07-30 ENCOUNTER — Encounter (HOSPITAL_COMMUNITY): Payer: Self-pay | Admitting: Surgery

## 2012-07-30 NOTE — Care Management Note (Signed)
    Page 1 of 1   07/30/2012     1:18:28 PM   CARE MANAGEMENT NOTE 07/30/2012  Patient:  Alexandra Stein, Alexandra Stein   Account Number:  [id number]  Date Initiated:  07/27/2012  Documentation initiated by:  PEARSON,COOKIE  Subjective/Objective Assessment:   pt admitted with RUQ abd pain with N, V     Action/Plan:   plan to dc home   Anticipated DC Date:  07/30/2012   Anticipated DC Plan:  HOME/SELF CARE      DC Planning Services  CM consult      Choice offered to / List presented to:             Status of service:  Completed, signed off Medicare Important Message given?  NA - LOS <3 / Initial given by admissions (If response is "NO", the following Medicare IM given date fields will be blank) Date Medicare IM given:   Date Additional Medicare IM given:    Discharge Disposition:  HOME/SELF CARE  Per UR Regulation:  Reviewed for med. necessity/level of care/duration of stay  If discussed at Long Length of Stay Meetings, dates discussed:    Comments:  07/27/12 MPearson, RN, BSN Chart reviewed.

## 2012-08-02 NOTE — Discharge Summary (Signed)
Patient ID: Alexandra Stein MRN: [id number] DOB/AGE: 06/10/1966 45 y.o.  Admit date: 07/26/2012 Discharge date: 07/28/2012  Procedures: lap chole  Consults: None  Reason for Admission:This is a 46 yo female who woke up yesterday morning at 0400am with RUQ abdominal pain with nausea and vomiting. She went to [facility] yesterday and had a CT scan that revealed gallstones but no evidence of cholecystitis. She was sent home with percocet, but this did not control her pain and she came back to [facility]. She then had an abdominal US which revealed some slight wall thickening, but all labs were normal. Her pain has persisted and we were asked to see for possible admission.   Admission Diagnoses:  1. Symptomatic cholelithiasis 2. biploar d/o  Hospital Course: The patient was admitted and taken to the operating room the following day for a lap chole.  This was uneventful.  She had a normal post operatively course and was felt stable for dc on POD# 1.  Discharge Diagnoses:  Principal Problem:  *Symptomatic cholelithiasis a/p lap chole  Discharge Medications:   Medication List     As of 08/02/2012  2:58 PM    TAKE these medications         ARIPiprazole 10 MG tablet   Commonly known as: ABILIFY   Take 10 mg by mouth at bedtime.      aspirin 81 MG chewable tablet   Chew 81 mg by mouth at bedtime.      DULoxetine 60 MG capsule   Commonly known as: CYMBALTA   Take 60 mg by mouth at bedtime.      OLANZapine 2.5 MG tablet   Commonly known as: ZYPREXA   Take 2.5 mg by mouth at bedtime.      ondansetron 4 MG tablet   Commonly known as: ZOFRAN   Take 4 mg by mouth every 8 (eight) hours as needed. Nausea      oxyCODONE 5 MG immediate release tablet   Commonly known as: Oxy IR/ROXICODONE   Take 1 tablet (5 mg total) by mouth every 4 (four) hours as needed.      oxyCODONE-acetaminophen 5-325 MG per tablet   Commonly known as: PERCOCET/ROXICET   Take 2 tablets by mouth every 4 (four) hours as  needed for pain.      potassium chloride SA 20 MEQ tablet   Commonly known as: K-DUR,KLOR-CON   Take 20 mEq by mouth at bedtime.      topiramate 100 MG tablet   Commonly known as: TOPAMAX   Take 100 mg by mouth at bedtime.        Discharge Instructions:     Follow-up Information    Follow up with [facility], [facility]. Call in 2 weeks.   Contact information:   [address] [place] [address] [phone]          Signed: Letha Cape 08/02/2012, 2:58 PM

## 2012-08-10 ENCOUNTER — Ambulatory Visit (INDEPENDENT_AMBULATORY_CARE_PROVIDER_SITE_OTHER): Payer: BC Managed Care – PPO | Admitting: Surgery

## 2012-08-10 ENCOUNTER — Encounter (HOSPITAL_COMMUNITY): Payer: Self-pay | Admitting: Pharmacist

## 2012-08-10 DIAGNOSIS — Z09 Encounter for follow-up examination after completed treatment for conditions other than malignant neoplasm: Secondary | ICD-10-CM

## 2012-08-10 NOTE — Progress Notes (Signed)
Subjective:     Patient ID: Alexandra Stein, female   DOB: Jun 10, 1966, 46 y.o.   MRN: [id number]  HPI She is here for first postop visit status post laparoscopic cholecystectomy. She is eating okay he gets full easily and is having loose bowel movements. She has no nausea and has no pain  Review of Systems     Objective:   Physical Exam On exam, she is actually well her parents. Her abdomen is soft. There is some slight erythema at her umbilical incision. The final pathology showed chronic cholecystitis with cholelithiasis    Assessment:     Patient stable status post laparoscopic cholecystectomy    Plan:     She will put Neosporin on her umbilical incision. I told her to give [place] some time to return to normal activity as far as her eating goes. She will see me back as needed

## 2012-08-15 NOTE — H&P (Signed)
Alexandra Stein  DICTATION # [id number] CSN# [id number]   Meriel Pica, MD 08/15/2012 10:23 AM

## 2012-08-16 NOTE — H&P (Signed)
Alexandra Stein, Alexandra Stein              ACCOUNT NO.:  [id number]  MEDICAL RECORD NO.:  [id number]  LOCATION:                                 FACILITY:  PHYSICIAN:  [facility] Salvia. Marcelle Overlie, M.D.DATE OF BIRTH:  1965-12-23  DATE OF ADMISSION: DATE OF DISCHARGE:                             HISTORY & PHYSICAL   CHIEF COMPLAINT:  Menorrhagia.  HISTORY OF PRESENT ILLNESS:  A 46 year old G3, P2 prior tubal.  She has actually had redo of the tubal after failed tubal.  Last saw her in 2008, but more recently for followup exam complaining of menorrhagia. She had LEEP in 2002, hemoglobin also recently 11.5.  As part of her evaluation, she was placed on OCPs b.i.d. to regulate her bleeding.  FSH on July 20, 2012 was 7.8.  FHT was done on July 20, 2012, showed a 10-mm probable polyp, small simple cyst on the right.  After saline infusion, there appeared to be several small polyps.  She presents now for D and C, hysteroscopy with NovaSure endometrial ablation.  This procedure including risks related to bleeding, infection, other complications may require additional surgery all reviewed with her, which she understands and accepts.  PAST MEDICAL HISTORY:  CURRENT MEDICATIONS:  Abilify, Cymbalta, Zyprexa, potassium, Lamictal.  SURGICAL HISTORY:  Cesarean section in 1994, tubal in 1994 and again in 1995.  REVIEW OF SYSTEMS:  Significant for prior history of abnormal Pap.  She has had LEEP in the past, history of migraine headache, and UTI.  FAMILY HISTORY:  Significant for heart disease, thyroid disease, TB, gallbladder disease, arthritis, diabetes, and breast cancer.  SOCIAL HISTORY:  Denies alcohol, tobacco, or drug use.  She is married. Dr. Elias Else is her PCP.  Last Pap was on July 20, 2012, which was normal.  PHYSICAL EXAMINATION:  VITAL SIGNS:  Temperature 98.2, blood pressure 120/78. HEENT:  Unremarkable. NECK:  Supple without masses. LUNGS:  Clear. CARDIOVASCULAR:   Regular rate and rhythm without murmurs, rubs, or gallops. BREASTS:  Without masses. ABDOMEN:  Soft, flat, nontender. PELVIC:  Normal external genitalia.  Vagina and cervix clear.  Uterus, mid position, normal size.  Adnexa negative. EXTREMITIES:  Unremarkable. NEUROLOGIC:  Unremarkable.  IMPRESSION:  Abnormal bleeding with menorrhagia, endometrial polyps.  PLAN:  D and C, hysteroscopy with NovaSure endometrial ablation. Procedure and risks discussed as above.     Ngai Parcell M. Marcelle Overlie, M.D.     [facility]MED[facility]  D:  08/15/2012  T:  08/15/2012  Job:  [id number]

## 2012-08-21 ENCOUNTER — Encounter (HOSPITAL_COMMUNITY)
Admission: RE | Admit: 2012-08-21 | Discharge: 2012-08-21 | Disposition: A | Discharge status: Home / Self Care | Payer: BC Managed Care – PPO | Source: Ambulatory Visit | Attending: Obstetrics and Gynecology | Admitting: Obstetrics and Gynecology

## 2012-08-21 ENCOUNTER — Encounter (HOSPITAL_COMMUNITY): Payer: Self-pay

## 2012-08-21 HISTORY — DX: Personal history of other specified conditions: Z87.898

## 2012-08-21 HISTORY — DX: Gastro-esophageal reflux disease without esophagitis: K21.9

## 2012-08-21 HISTORY — DX: Hypokalemia: E87.6

## 2012-08-21 LAB — CBC
HCT: 36.9 % (ref 36.0–46.0)
Hemoglobin: 12 g/dL (ref 12.0–15.0)
RBC: 4.11 MIL/uL (ref 3.87–5.11)
RDW: 13.4 % (ref 11.5–15.5)
WBC: 6 10*3/uL (ref 4.0–10.5)

## 2012-08-21 LAB — BASIC METABOLIC PANEL
BUN: 15 mg/dL (ref 6–23)
CO2: 21 mEq/L (ref 19–32)
Chloride: 108 mEq/L (ref 96–112)
GFR calc Af Amer: 84 mL/min — ABNORMAL LOW (ref >90)
Glucose, Bld: 91 mg/dL (ref 70–99)
Potassium: 4 mEq/L (ref 3.5–5.1)

## 2012-08-21 NOTE — Patient Instructions (Addendum)
Your procedure is scheduled on:08/27/12  Enter through the Main Entrance at :6am Pick up desk phone and dial [phone] and inform us of your arrival.  Please call [phone] if you have any problems the morning of surgery.  Remember: Do not eat after midnight: Sunday Do not drink after:midnight Sunday    DO NOT wear jewelry, eye make-up, lipstick,body lotion, or dark fingernail polish. Do not shave for 48 hours prior to surgery.  Patients discharged on the day of surgery will not be allowed to drive home

## 2012-08-27 ENCOUNTER — Ambulatory Visit (HOSPITAL_COMMUNITY)
Admission: AD | Admit: 2012-08-27 | Discharge: 2012-08-27 | Disposition: A | Discharge status: Home / Self Care | Payer: BC Managed Care – PPO | Source: Ambulatory Visit | Attending: Obstetrics and Gynecology | Admitting: Obstetrics and Gynecology

## 2012-08-27 ENCOUNTER — Encounter (HOSPITAL_COMMUNITY): Payer: Self-pay | Admitting: *Deleted

## 2012-08-27 ENCOUNTER — Ambulatory Visit (HOSPITAL_COMMUNITY): Payer: BC Managed Care – PPO | Admitting: Anesthesiology

## 2012-08-27 ENCOUNTER — Encounter (HOSPITAL_COMMUNITY)
Admission: AD | Disposition: A | Discharge status: Home / Self Care | Payer: Self-pay | Source: Ambulatory Visit | Attending: Obstetrics and Gynecology

## 2012-08-27 ENCOUNTER — Encounter (HOSPITAL_COMMUNITY): Payer: Self-pay | Admitting: Anesthesiology

## 2012-08-27 DIAGNOSIS — N920 Excessive and frequent menstruation with regular cycle: Secondary | ICD-10-CM | POA: Insufficient documentation

## 2012-08-27 DIAGNOSIS — N840 Polyp of corpus uteri: Secondary | ICD-10-CM | POA: Insufficient documentation

## 2012-08-27 SURGERY — DILATATION & CURETTAGE/HYSTEROSCOPY WITH NOVASURE ABLATION
Anesthesia: General | Site: Uterus | Wound class: Clean Contaminated

## 2012-08-27 MED ORDER — FENTANYL CITRATE 0.05 MG/ML IJ SOLN
INTRAMUSCULAR | Status: DC | PRN
  Administered 2012-08-27: 100 ug via INTRAVENOUS

## 2012-08-27 MED ORDER — MIDAZOLAM HCL 2 MG/2ML IJ SOLN
INTRAMUSCULAR | Status: AC
  Filled 2012-08-27: qty 2

## 2012-08-27 MED ORDER — KETOROLAC TROMETHAMINE 60 MG/2ML IM SOLN
60.0000 mg | Freq: Once | INTRAMUSCULAR | Status: AC
Start: 2012-08-27 — End: 2012-08-27
  Administered 2012-08-27: 60 mg via INTRAMUSCULAR

## 2012-08-27 MED ORDER — ONDANSETRON HCL 4 MG/2ML IJ SOLN
INTRAMUSCULAR | Status: AC
  Filled 2012-08-27: qty 2

## 2012-08-27 MED ORDER — LACTATED RINGERS IV SOLN
INTRAVENOUS | Status: DC
  Administered 2012-08-27: 125 mL/h via INTRAVENOUS
  Administered 2012-08-27: 08:00:00 via INTRAVENOUS

## 2012-08-27 MED ORDER — KETOROLAC TROMETHAMINE 30 MG/ML IJ SOLN: 15.0000 mg | Freq: Once | INTRAMUSCULAR | Status: DC | PRN

## 2012-08-27 MED ORDER — KETOROLAC TROMETHAMINE 60 MG/2ML IM SOLN
INTRAMUSCULAR | Status: AC
  Administered 2012-08-27: 60 mg via INTRAMUSCULAR
  Filled 2012-08-27: qty 2

## 2012-08-27 MED ORDER — FENTANYL CITRATE 0.05 MG/ML IJ SOLN: 25.0000 ug | INTRAMUSCULAR | Status: DC | PRN

## 2012-08-27 MED ORDER — PROPOFOL 10 MG/ML IV EMUL
INTRAVENOUS | Status: DC | PRN
  Administered 2012-08-27: 200 mg via INTRAVENOUS

## 2012-08-27 MED ORDER — ONDANSETRON HCL 4 MG/2ML IJ SOLN
INTRAMUSCULAR | Status: DC | PRN
  Administered 2012-08-27: 4 mg via INTRAVENOUS

## 2012-08-27 MED ORDER — CEFAZOLIN SODIUM-DEXTROSE 2-3 GM-% IV SOLR
2.0000 g | INTRAVENOUS | Status: AC
  Administered 2012-08-27: 2 g via INTRAVENOUS

## 2012-08-27 MED ORDER — HYDROCODONE-ACETAMINOPHEN 5-325 MG PO TABS
ORAL_TABLET | ORAL | Status: AC
  Filled 2012-08-27: qty 1

## 2012-08-27 MED ORDER — KETOROLAC TROMETHAMINE 30 MG/ML IJ SOLN
INTRAMUSCULAR | Status: AC
  Filled 2012-08-27: qty 2

## 2012-08-27 MED ORDER — SODIUM CHLORIDE 0.9 % IR SOLN
Status: DC | PRN
  Administered 2012-08-27: 3000 mL via INTRAVESICAL

## 2012-08-27 MED ORDER — LIDOCAINE HCL (CARDIAC) 20 MG/ML IV SOLN
INTRAVENOUS | Status: DC | PRN
  Administered 2012-08-27: 100 mg via INTRAVENOUS

## 2012-08-27 MED ORDER — MIDAZOLAM HCL 2 MG/2ML IJ SOLN: 0.5000 mg | Freq: Once | INTRAMUSCULAR | Status: DC | PRN

## 2012-08-27 MED ORDER — MIDAZOLAM HCL 5 MG/5ML IJ SOLN
INTRAMUSCULAR | Status: DC | PRN
  Administered 2012-08-27: 2 mg via INTRAVENOUS

## 2012-08-27 MED ORDER — PROPOFOL 10 MG/ML IV EMUL
INTRAVENOUS | Status: AC
  Filled 2012-08-27: qty 20

## 2012-08-27 MED ORDER — MEPERIDINE HCL 25 MG/ML IJ SOLN: 6.2500 mg | INTRAMUSCULAR | Status: DC | PRN

## 2012-08-27 MED ORDER — LIDOCAINE HCL (CARDIAC) 20 MG/ML IV SOLN
INTRAVENOUS | Status: AC
  Filled 2012-08-27: qty 5

## 2012-08-27 MED ORDER — CEFAZOLIN SODIUM-DEXTROSE 2-3 GM-% IV SOLR
INTRAVENOUS | Status: AC
  Filled 2012-08-27: qty 50

## 2012-08-27 MED ORDER — HYDROCODONE-ACETAMINOPHEN 5-325 MG PO TABS
1.0000 | ORAL_TABLET | Freq: Once | ORAL | Status: AC
  Administered 2012-08-27: 1 via ORAL

## 2012-08-27 MED ORDER — FENTANYL CITRATE 0.05 MG/ML IJ SOLN
INTRAMUSCULAR | Status: AC
  Filled 2012-08-27: qty 5

## 2012-08-27 MED ORDER — HYDROCODONE-IBUPROFEN 7.5-200 MG PO TABS: 1.0000 | ORAL_TABLET | Freq: Three times a day (TID) | ORAL | Status: DC | PRN

## 2012-08-27 MED ORDER — LIDOCAINE HCL 1 % IJ SOLN
INTRAMUSCULAR | Status: DC | PRN
  Administered 2012-08-27: 10 mL

## 2012-08-27 MED ORDER — PROMETHAZINE HCL 25 MG/ML IJ SOLN: 6.2500 mg | INTRAMUSCULAR | Status: DC | PRN

## 2012-08-27 SURGICAL SUPPLY — 18 items
ABLATOR ENDOMETRIAL BIPOLAR (ABLATOR) ×2 IMPLANT
BLADE INCISOR TRUC PLUS 2.9 (ABLATOR) ×1 IMPLANT
CLOTH BEACON ORANGE TIMEOUT ST (SAFETY) ×2 IMPLANT
CONTAINER PREFILL 10% NBF 60ML (FORM) ×4 IMPLANT
DRAPE HYSTEROSCOPY (DRAPE) ×2 IMPLANT
DRESSING TELFA 8X3 (GAUZE/BANDAGES/DRESSINGS) ×4 IMPLANT
GLOVE BIO SURGEON STRL SZ7 (GLOVE) ×4 IMPLANT
GOWN BRE IMP SLV SIRUS LXLNG (GOWN DISPOSABLE) ×4 IMPLANT
GOWN STRL REIN XL XLG (GOWN DISPOSABLE) ×2 IMPLANT
INCISOR TRUC PLUS BLADE 2.9 (ABLATOR) ×2
KIT HYSTEROSCOPY TRUCLEAR (ABLATOR) ×2 IMPLANT
NEEDLE SPNL 22GX3.5 QUINCKE BK (NEEDLE) ×2 IMPLANT
PACK VAGINAL MINOR WOMEN LF (CUSTOM PROCEDURE TRAY) ×2 IMPLANT
PAD OB MATERNITY 4.3X12.25 (PERSONAL CARE ITEMS) ×2 IMPLANT
SUT CHROMIC 2 0 UR 5 27 (SUTURE) ×2 IMPLANT
SYR CONTROL 10ML LL (SYRINGE) ×2 IMPLANT
TOWEL OR 17X24 6PK STRL BLUE (TOWEL DISPOSABLE) ×4 IMPLANT
WATER STERILE IRR 1000ML POUR (IV SOLUTION) ×2 IMPLANT

## 2012-08-27 NOTE — Transfer of Care (Signed)
Immediate Anesthesia Transfer of Care Note  Patient: Alexandra Stein  Procedure(s) Performed: Procedure(s) (LRB) with comments: DILATATION & CURETTAGE/HYSTEROSCOPY WITH NOVASURE ABLATION (N/A) - w/ Truclear  Patient Location: PACU  Anesthesia Type: General  Level of Consciousness: awake, alert  and oriented  Airway & Oxygen Therapy: Patient connected to nasal cannula oxygen  Post-op Assessment: Report given to PACU RN and Post -op Vital signs reviewed and stable  Post vital signs: stable  Complications: No apparent anesthesia complications

## 2012-08-27 NOTE — Progress Notes (Signed)
The patient was re-examined with no change in status 

## 2012-08-27 NOTE — Anesthesia Preprocedure Evaluation (Signed)
Anesthesia Evaluation  Patient identified by MRN, date of birth, ID band Patient awake    Reviewed: Allergy & Precautions, H&P , Patient's Chart, lab work & pertinent test results, reviewed documented beta blocker date and time   History of Anesthesia Complications Negative for: history of anesthetic complications  Airway Mallampati: III TM Distance: >3 FB Neck ROM: full    Dental No notable dental hx.    Pulmonary neg pulmonary ROS,  breath sounds clear to auscultation  Pulmonary exam normal       Cardiovascular Exercise Tolerance: Good negative cardio ROS  Rhythm:regular Rate:Normal     Neuro/Psych  Headaches, PSYCHIATRIC DISORDERS Bipolar Disorder negative neurological ROS  negative psych ROS   GI/Hepatic negative GI ROS, Neg liver ROS, GERD-  Controlled,  Endo/Other  negative endocrine ROSMorbid obesity  Renal/GU negative Renal ROS     Musculoskeletal   Abdominal   Peds  Hematology negative hematology ROS (+)   Anesthesia Other Findings Bipolar 2 disorder     History of syncope 2002 Vasovagal    Hyperlipidemia     Migraines        GERD (gastroesophageal reflux disease)     Potassium (K) deficiency   per pt K+ deficiency - ? from meds she takes ( none of which are diuretucs)    H/O chest pain   dx'd with GERD SVD (spontaneous vaginal delivery)    Reproductive/Obstetrics negative OB ROS                           Anesthesia Physical Anesthesia Plan  ASA: III  Anesthesia Plan: General LMA   Post-op Pain Management:    Induction:   Airway Management Planned:   Additional Equipment:   Intra-op Plan:   Post-operative Plan:   Informed Consent: I have reviewed the patients History and Physical, chart, labs and discussed the procedure including the risks, benefits and alternatives for the proposed anesthesia with the patient or authorized representative who has indicated his/her  understanding and acceptance.   Dental Advisory Given  Plan Discussed with: CRNA, Surgeon and Anesthesiologist  Anesthesia Plan Comments:         Anesthesia Quick Evaluation

## 2012-08-27 NOTE — Anesthesia Postprocedure Evaluation (Signed)
  Anesthesia Post-op Note  Patient: Alexandra Stein  Procedure(s) Performed: Procedure(s) (LRB) with comments: DILATATION & CURETTAGE/HYSTEROSCOPY WITH NOVASURE ABLATION (N/A) - w/ Truclear  Patient is awake and responsive. Pain and nausea are reasonably well controlled. Vital signs are stable and clinically acceptable. Oxygen saturation is clinically acceptable. There are no apparent anesthetic complications at this time. Patient is ready for discharge.

## 2012-08-27 NOTE — Op Note (Signed)
Preoperative diagnosis: Menorrhagia, endometrial polyps  Postoperative diagnosis: Same  Procedure: Hysteroscopy, with true clear resection of endometrial polyps, NovaSure endometrial ablation  Surgeon: Marcelle Overlie  Anesthesia: Gen.  Specimens removed: Endometrial polyps, to pathology  Complications:: No immediate convocation  Procedure and findings:  The patient taken the operating room after an adequate level of general anesthesia was obtained with the patient's legs in stirrups the perineum and vagina were prepped and draped in usual fashion for D&C, the bladder was drained. He UA carried out uterus is mid anterior normal size, mobile adnexa negative. Appropriate timeout to been taken at this point.  Speculum was positioned cervix grasped with tenaculum paracervical block was then created by infiltrating at 3 and 9:00 submucosally, 5-7 cc 1% Xylocaine after negative aspiration at each site. The uterus is then sounded to 9 cm with a cervical length of 3.0, progressively dilated to a 27 Pratt dilator. The 5 mm continuous-flow true clear hysteroscope was inserted 2 small endometrial polyps were noted, these were resected with the small morcellator and sent to pathology. The shape of the cavity was normal at this point. The hysteroscope was removed, NovaSure endometrial ablation was then carried out with a width of 4.0, passing the CO2 testing the treatment cycle of approximately 90 seconds. She tolerated this well, went to recovery room in good condition.  Dictated with [facility]  Savhanna Sliva M. Milana Obey.D.

## 2013-08-27 ENCOUNTER — Emergency Department (HOSPITAL_COMMUNITY)
Admission: EM | Admit: 2013-08-27 | Discharge: 2013-08-27 | Disposition: A | Discharge status: Home / Self Care | Payer: BC Managed Care – PPO | Attending: Emergency Medicine | Admitting: Emergency Medicine

## 2013-08-27 ENCOUNTER — Encounter (HOSPITAL_COMMUNITY): Payer: Self-pay | Admitting: Emergency Medicine

## 2013-08-27 DIAGNOSIS — L089 Local infection of the skin and subcutaneous tissue, unspecified: Secondary | ICD-10-CM | POA: Insufficient documentation

## 2013-08-27 DIAGNOSIS — Z79899 Other long term (current) drug therapy: Secondary | ICD-10-CM | POA: Insufficient documentation

## 2013-08-27 DIAGNOSIS — Z8669 Personal history of other diseases of the nervous system and sense organs: Secondary | ICD-10-CM | POA: Insufficient documentation

## 2013-08-27 DIAGNOSIS — Z7982 Long term (current) use of aspirin: Secondary | ICD-10-CM | POA: Insufficient documentation

## 2013-08-27 DIAGNOSIS — K219 Gastro-esophageal reflux disease without esophagitis: Secondary | ICD-10-CM | POA: Insufficient documentation

## 2013-08-27 DIAGNOSIS — Z885 Allergy status to narcotic agent status: Secondary | ICD-10-CM | POA: Insufficient documentation

## 2013-08-27 DIAGNOSIS — E785 Hyperlipidemia, unspecified: Secondary | ICD-10-CM | POA: Insufficient documentation

## 2013-08-27 DIAGNOSIS — Z9189 Other specified personal risk factors, not elsewhere classified: Secondary | ICD-10-CM | POA: Insufficient documentation

## 2013-08-27 DIAGNOSIS — Z8742 Personal history of other diseases of the female genital tract: Secondary | ICD-10-CM | POA: Insufficient documentation

## 2013-08-27 DIAGNOSIS — F3189 Other bipolar disorder: Secondary | ICD-10-CM | POA: Insufficient documentation

## 2013-08-27 MED ORDER — CEPHALEXIN 250 MG PO CAPS
500.0000 mg | ORAL_CAPSULE | Freq: Once | ORAL | Status: AC
  Administered 2013-08-27: 500 mg via ORAL
  Filled 2013-08-27: qty 2

## 2013-08-27 MED ORDER — MUPIROCIN CALCIUM 2 % EX CREA: TOPICAL_CREAM | Freq: Two times a day (BID) | CUTANEOUS | Status: DC

## 2013-08-27 MED ORDER — MUPIROCIN CALCIUM 2 % EX CREA
TOPICAL_CREAM | Freq: Once | CUTANEOUS | Status: AC
  Administered 2013-08-27: 22:00:00 via TOPICAL
  Filled 2013-08-27: qty 15

## 2013-08-27 MED ORDER — CEPHALEXIN 250 MG PO CAPS: 250.0000 mg | ORAL_CAPSULE | Freq: Four times a day (QID) | ORAL | Status: DC

## 2013-08-27 MED ORDER — CEPHALEXIN 500 MG PO CAPS: 250.0000 mg | ORAL_CAPSULE | Freq: Two times a day (BID) | ORAL | Status: DC

## 2013-08-27 NOTE — ED Notes (Signed)
Pt reports right foot pain that started yesterday. States this morning she noticed a small cut to her toe and some skin came off the toe. Report the pain became worse throughout the day. Also with some swelling today.

## 2013-08-27 NOTE — ED Provider Notes (Signed)
CSN: [id number]     Arrival date & time 08/27/13  1811 History   First MD Initiated Contact with Patient 08/27/13 2105     Chief Complaint  Patient presents with  . Foot Pain   (Consider location/radiation/quality/duration/timing/severity/associated sxs/prior Treatment) HPI Comments: Patient noticed a blister, or white, raised area between her fourth and fifth right toes.  Yesterday.  This abruption or to produce a small amount of pertinent material.  It has been painful since.  Denies any fever, generalized myalgias, redness, or trauma to the foot  Patient is a 47 y.o. female presenting with lower extremity pain. The history is provided by the patient.  Foot Pain This is a new problem. The problem has been gradually worsening. Pertinent negatives include no fever or joint swelling. The symptoms are aggravated by walking. She has tried nothing for the symptoms. The treatment provided no relief.    Past Medical History  Diagnosis Date  . Bipolar 2 disorder   . History of syncope 2002    Vasovagal  . Hyperlipidemia   . Migraines   . GERD (gastroesophageal reflux disease)   . Potassium (K) deficiency     per pt K+ deficiency - ? from meds she takes ( none of which are diuretucs)  . H/O chest pain     dx'd with GERD  . SVD (spontaneous vaginal delivery)     x 2   Past Surgical History  Procedure Laterality Date  . Cesarean section      x 1  . Cholecystectomy  07/27/2012    Procedure: LAPAROSCOPIC CHOLECYSTECTOMY;  Surgeon: Shelly Rubenstein, MD;  Location: [facility];  Service: General;  Laterality: N/A;  . Tubal ligation      x 2  . Wisdom tooth extraction     Family History  Problem Relation Age of Onset  . Dilated cardiomyopathy Mother     with ICD  . Lung cancer    . Breast cancer    . Diabetes type II Father    History  Substance Use Topics  . Smoking status: Never Smoker   . Smokeless tobacco: Never Used  . Alcohol Use: No   OB History   Grav Para Term Preterm  Abortions TAB SAB Ect Mult Living                 Review of Systems  Constitutional: Negative for fever.  Musculoskeletal: Negative for joint swelling.  Skin: Positive for wound. Negative for color change.  All other systems reviewed and are negative.    Allergies  Codeine  Home Medications   Current Outpatient Rx  Name  Route  Sig  Dispense  Refill  . ARIPiprazole (ABILIFY) 10 MG tablet   Oral   Take 10 mg by mouth at bedtime.         Marland Kitchen aspirin 81 MG chewable tablet   Oral   Chew 81 mg by mouth at bedtime.          . DULoxetine (CYMBALTA) 60 MG capsule   Oral   Take 60 mg by mouth at bedtime.          Marland Kitchen OLANZapine (ZYPREXA) 5 MG tablet   Oral   Take 5 mg by mouth at bedtime.         . ondansetron (ZOFRAN) 4 MG tablet   Oral   Take 4 mg by mouth every 8 (eight) hours as needed. Nausea         . potassium chloride (  K-DUR) 10 MEQ tablet   Oral   Take 10 mEq by mouth at bedtime.         . topiramate (TOPAMAX) 100 MG tablet   Oral   Take 100 mg by mouth at bedtime.          . cephALEXin (KEFLEX) 500 MG capsule   Oral   Take 1 capsule (500 mg total) by mouth 2 (two) times daily.   250 capsule   0   . mupirocin cream (BACTROBAN) 2 %   Topical   Apply topically 2 (two) times daily. To toes   15 g   0    BP 116/58  Pulse 70  Temp(Src) 97.4 F (36.3 C) (Oral)  Resp 18  Ht 5' 2.5" (1.588 m)  Wt 229 lb (103.874 kg)  BMI 41.19 kg/m2  SpO2 98% Physical Exam  Nursing note and vitals reviewed. Constitutional: She appears well-developed and well-nourished.  Eyes: Pupils are equal, round, and reactive to light.  Neck: Normal range of motion.  Cardiovascular: Normal rate and regular rhythm.   Pulmonary/Chest: Effort normal.  Abdominal: Soft.  Musculoskeletal: She exhibits tenderness. She exhibits no edema.  Patient has an open area from the base of the fifth toe to in between the fourth and fifth toes.  No pertinent material and no color  change.  Full range of motion of all toes.  No pain into the mid foot  Neurological: She is alert.  Skin: Skin is warm. No erythema.    ED Course  Procedures (including critical care time) Labs Review Labs Reviewed - No data to display Imaging Review No results found.  EKG Interpretation   None       MDM   1. Skin infection     This appears to have been a blister that ruptured causing a slight break in the skin from the base of the fifth toe to between the fourth and fifth toes.  Bactroban is being applied topically as a percussion.  Keflex.  Has been ordered to 2 mg twice a day for 10 days.  Patient is to followup with her primary care physician in 2 days for a wound check.  Has been placed in a postop shoe, to allow for more, air circulation, and better healing    Arman Filter, NP 08/27/13 2144

## 2013-08-27 NOTE — ED Notes (Signed)
Unable to obtain e-signature due to signature pad not working.  Pt verbalized understanding of d/c and f/u.  No additional questions by pt regarding d/c.  Ambulatory at discharge.

## 2013-08-29 NOTE — ED Provider Notes (Signed)
Medical screening examination/treatment/procedure(s) were performed by non-physician practitioner and as supervising physician I was immediately available for consultation/collaboration.  EKG Interpretation   None        Mahmoud Blazejewski, MD 08/29/13 1419 

## 2014-10-21 ENCOUNTER — Other Ambulatory Visit: Payer: Self-pay | Admitting: Family Medicine

## 2014-10-21 DIAGNOSIS — Z1231 Encounter for screening mammogram for malignant neoplasm of breast: Secondary | ICD-10-CM

## 2014-10-24 ENCOUNTER — Ambulatory Visit
Admission: RE | Admit: 2014-10-24 | Discharge: 2014-10-24 | Disposition: A | Discharge status: Home / Self Care | Payer: BC Managed Care – PPO | Source: Ambulatory Visit | Attending: Family Medicine | Admitting: Family Medicine

## 2014-10-24 DIAGNOSIS — Z1231 Encounter for screening mammogram for malignant neoplasm of breast: Secondary | ICD-10-CM

## 2015-02-19 ENCOUNTER — Encounter (HOSPITAL_COMMUNITY): Payer: Self-pay | Admitting: *Deleted

## 2015-02-19 ENCOUNTER — Emergency Department (HOSPITAL_COMMUNITY): Payer: BLUE CROSS/BLUE SHIELD

## 2015-02-19 ENCOUNTER — Emergency Department (HOSPITAL_COMMUNITY)
Admission: EM | Admit: 2015-02-19 | Discharge: 2015-02-20 | Disposition: A | Discharge status: Home / Self Care | Payer: BLUE CROSS/BLUE SHIELD | Attending: Emergency Medicine | Admitting: Emergency Medicine

## 2015-02-19 DIAGNOSIS — F319 Bipolar disorder, unspecified: Secondary | ICD-10-CM | POA: Insufficient documentation

## 2015-02-19 DIAGNOSIS — Z8639 Personal history of other endocrine, nutritional and metabolic disease: Secondary | ICD-10-CM | POA: Insufficient documentation

## 2015-02-19 DIAGNOSIS — Y939 Activity, unspecified: Secondary | ICD-10-CM | POA: Insufficient documentation

## 2015-02-19 DIAGNOSIS — Y929 Unspecified place or not applicable: Secondary | ICD-10-CM | POA: Insufficient documentation

## 2015-02-19 DIAGNOSIS — S29012A Strain of muscle and tendon of back wall of thorax, initial encounter: Secondary | ICD-10-CM | POA: Insufficient documentation

## 2015-02-19 DIAGNOSIS — Z8679 Personal history of other diseases of the circulatory system: Secondary | ICD-10-CM | POA: Insufficient documentation

## 2015-02-19 DIAGNOSIS — Z8719 Personal history of other diseases of the digestive system: Secondary | ICD-10-CM | POA: Insufficient documentation

## 2015-02-19 DIAGNOSIS — X58XXXA Exposure to other specified factors, initial encounter: Secondary | ICD-10-CM | POA: Insufficient documentation

## 2015-02-19 DIAGNOSIS — S24109A Unspecified injury at unspecified level of thoracic spinal cord, initial encounter: Secondary | ICD-10-CM | POA: Diagnosis present

## 2015-02-19 DIAGNOSIS — Z7982 Long term (current) use of aspirin: Secondary | ICD-10-CM | POA: Insufficient documentation

## 2015-02-19 DIAGNOSIS — Y999 Unspecified external cause status: Secondary | ICD-10-CM | POA: Diagnosis not present

## 2015-02-19 LAB — BASIC METABOLIC PANEL
Anion gap: 11 (ref 5–15)
BUN: 16 mg/dL (ref 6–23)
CHLORIDE: 106 mmol/L (ref 96–112)
CO2: 22 mmol/L (ref 19–32)
Calcium: 9.1 mg/dL (ref 8.4–10.5)
Creatinine, Ser: 0.98 mg/dL (ref 0.50–1.10)
GFR calc non Af Amer: 67 mL/min — ABNORMAL LOW (ref >90)
GFR, EST AFRICAN AMERICAN: 78 mL/min — AB (ref >90)
GLUCOSE: 109 mg/dL — AB (ref 70–99)
POTASSIUM: 3.9 mmol/L (ref 3.5–5.1)
Sodium: 139 mmol/L (ref 135–145)

## 2015-02-19 LAB — I-STAT TROPONIN, ED: TROPONIN I, POC: 0 ng/mL (ref 0.00–0.08)

## 2015-02-19 LAB — CBC
HCT: 38.4 % (ref 36.0–46.0)
HEMOGLOBIN: 12.6 g/dL (ref 12.0–15.0)
MCH: 29.9 pg (ref 26.0–34.0)
MCHC: 32.8 g/dL (ref 30.0–36.0)
MCV: 91 fL (ref 78.0–100.0)
Platelets: 208 10*3/uL (ref 150–400)
RBC: 4.22 MIL/uL (ref 3.87–5.11)
RDW: 13.9 % (ref 11.5–15.5)
WBC: 8.5 10*3/uL (ref 4.0–10.5)

## 2015-02-19 MED ORDER — CYCLOBENZAPRINE HCL 10 MG PO TABS
10.0000 mg | ORAL_TABLET | Freq: Once | ORAL | Status: AC
  Administered 2015-02-19: 10 mg via ORAL
  Filled 2015-02-19: qty 1

## 2015-02-19 MED ORDER — KETOROLAC TROMETHAMINE 60 MG/2ML IM SOLN
60.0000 mg | Freq: Once | INTRAMUSCULAR | Status: AC
  Administered 2015-02-19: 60 mg via INTRAMUSCULAR
  Filled 2015-02-19: qty 2

## 2015-02-19 NOTE — ED Notes (Signed)
Disregard triage charting by Shanna CiscoKevin Jamaul Heist, RN, wrong patient.

## 2015-02-19 NOTE — ED Provider Notes (Signed)
CSN: [id number]     Arrival date & time 02/19/15  2202 History  This chart was scribed for Arby Barrette, MD by Bronson Curb, ED Scribe. This patient was seen in room B16C/B16C and the patient's care was started at 11:07 PM.   Chief Complaint  Patient presents with  . Back Pain    The history is provided by the patient. No language interpreter was used.    HPI Comments: Alexandra Stein is a 49 y.o. female, brought in by ambulance, who presents to the Emergency Department complaining of sudden onset, constant, 9/10, left shoulder pain that radiates the upper back that began PTA. Patient states she was bending down to change clothes when she felt sharp pain in the left shoulder, followed by dizziness. Patient has not taken anything for pain relief prior to coming to the ED, and reports movement of the left arm exacerbates the pain. She denies recent long travel, surgical procedures, or history of PE/DVT. She further denies chest pain, SOB, nausea, vomiting, abdominal paindiaphoresis, BLE swelling, or numbness/tingling of the extremities. Patient is a nonsmoker.  Past Medical History  Diagnosis Date  . Bipolar 2 disorder   . History of syncope 2002    Vasovagal  . Hyperlipidemia   . Migraines   . GERD (gastroesophageal reflux disease)   . Potassium (K) deficiency     per pt K+ deficiency - ? from meds she takes ( none of which are diuretucs)  . H/O chest pain     dx'd with GERD  . SVD (spontaneous vaginal delivery)     x 2   Past Surgical History  Procedure Laterality Date  . Cesarean section      x 1  . Cholecystectomy  07/27/2012    Procedure: LAPAROSCOPIC CHOLECYSTECTOMY;  Surgeon: Shelly Rubenstein, MD;  Location: [facility];  Service: General;  Laterality: N/A;  . Tubal ligation      x 2  . Wisdom tooth extraction     Family History  Problem Relation Age of Onset  . Dilated cardiomyopathy Mother     with ICD  . Lung cancer    . Breast cancer    . Diabetes type II  Father    History  Substance Use Topics  . Smoking status: Never Smoker   . Smokeless tobacco: Never Used  . Alcohol Use: No   OB History    No data available     Review of Systems  A complete 10 system review of systems was obtained and all systems are negative except as noted in the HPI and PMH.    Allergies  Codeine  Home Medications   Prior to Admission medications   Medication Sig Start Date End Date Taking? Authorizing Provider  ARIPiprazole (ABILIFY) 10 MG tablet Take 10 mg by mouth at bedtime.   Yes Historical Provider, MD  DULoxetine (CYMBALTA) 60 MG capsule Take 60 mg by mouth at bedtime.    Yes Historical Provider, MD  Multiple Vitamin (MULTIVITAMIN WITH MINERALS) TABS tablet Take 1 tablet by mouth 3 (three) times daily.   Yes Historical Provider, MD  naproxen sodium (ANAPROX) 220 MG tablet Take 440 mg by mouth 2 (two) times daily as needed (pain).   Yes Historical Provider, MD  OLANZapine (ZYPREXA) 5 MG tablet Take 5 mg by mouth at bedtime.   Yes Historical Provider, MD  ondansetron (ZOFRAN) 4 MG tablet Take 4 mg by mouth every 8 (eight) hours as needed. Nausea 07/25/12  Yes Abigail  Harris, PA-C  topiramate (TOPAMAX) 100 MG tablet Take 100 mg by mouth at bedtime.    Yes Historical Provider, MD  aspirin 81 MG chewable tablet Chew 81 mg by mouth at bedtime.     Historical Provider, MD  cephALEXin (KEFLEX) 250 MG capsule Take 1 capsule (250 mg total) by mouth 4 (four) times daily. Patient not taking: Reported on 02/19/2015 08/27/13   Earley Favor, NP  cephALEXin (KEFLEX) 500 MG capsule Take 1 capsule (500 mg total) by mouth 2 (two) times daily. Patient not taking: Reported on 02/19/2015 08/27/13   Earley Favor, NP  mupirocin cream (BACTROBAN) 2 % Apply topically 2 (two) times daily. To toes Patient not taking: Reported on 02/19/2015 08/27/13   Earley Favor, NP  naproxen (NAPROSYN) 500 MG tablet Take 1 tablet (500 mg total) by mouth 2 (two) times daily. 02/20/15   Arby Barrette, MD  orphenadrine (NORFLEX) 100 MG tablet Take 1 tablet (100 mg total) by mouth 2 (two) times daily. 02/20/15   Arby Barrette, MD  potassium chloride (K-DUR) 10 MEQ tablet Take 10 mEq by mouth at bedtime.    Historical Provider, MD   Triage Vitals: BP 112/59 mmHg  Pulse 77  Temp(Src) 98.2 F (36.8 C) (Oral)  Resp 18  Ht  (1.575 m)  Wt 244 lb (110.678 kg)  BMI 44.62 kg/m2  SpO2 100%  LMP 08/18/2012  Physical Exam  Constitutional: She is oriented to person, place, and time. She appears well-developed and well-nourished. No distress.  HENT:  Head: Normocephalic and atraumatic.  Eyes: Conjunctivae and EOM are normal.  Neck: Neck supple. No tracheal deviation present.  Cardiovascular: Normal rate, regular rhythm, normal heart sounds and intact distal pulses.   Pulmonary/Chest: Effort normal. No respiratory distress. She has no wheezes. She has no rales.  Musculoskeletal: Normal range of motion.  Patient has very reproducible posterior thoracic musculoskeletal pain at about the level of T4 and the medial scapular margin. This reproduces the pain. Pain is also reproduced by movement of the arm and twisting. There is normal to inspection and palpation. Patient has excellent strength and use of the upper extremities with no pulse deficit or weakness or numbness.  Neurological: She is alert and oriented to person, place, and time.  Skin: Skin is warm and dry.  Psychiatric: She has a normal mood and affect. Her behavior is normal.  Nursing note and vitals reviewed.   ED Course  Procedures (including critical care time)  DIAGNOSTIC STUDIES: Oxygen Saturation is 100% on room air, normal by my interpretation.    COORDINATION OF CARE: At 2311 Discussed treatment plan with patient which includes pain medication and CXR. Patient agrees.   Labs Review Labs Reviewed  BASIC METABOLIC PANEL - Abnormal; Notable for the following:    Glucose, Bld 109 (*)    GFR calc non Af Amer 67  (*)    GFR calc Af Amer 78 (*)    All other components within normal limits  CBC  I-STAT TROPOININ, ED    Imaging Review Dg Chest 2 View  02/19/2015   CLINICAL DATA:  Left upper back pain.  EXAM: CHEST  2 VIEW  COMPARISON:  03/30/2012  FINDINGS: Lung volumes are low with minimal bibasilar atelectasis. The cardiomediastinal contours are normal. Pulmonary vasculature is normal. No consolidation, pleural effusion, or pneumothorax. No acute osseous abnormalities are seen. Thoracic spine left upper ribs appear intact. Air-fluid level noted in the stomach.  IMPRESSION: Low lung volumes with bibasilar atelectasis.  Electronically Signed   By: Rubye OaksMelanie  Ehinger M.D.   On: 02/19/2015 23:55     EKG Interpretation   Date/Time:  Thursday February 19 2015 22:22:24 EDT Ventricular Rate:  79 PR Interval:  156 QRS Duration: 90 QT Interval:  546 QTC Calculation: 626 R Axis:   17 Text Interpretation:  Sinus rhythm Low voltage, precordial leads  Borderline T abnormalities, anterior leads Prolonged QT interval Abnormal  ekg since last tracing no significant change Confirmed by MILLER  MD,  BRIAN ([id number]) on 02/19/2015 10:30:36 PM      MDM   Final diagnoses:  Strain of thoracic paraspinal muscles excluding T1 and T2 levels, initial encounter   Patient presented with acute onset of thoracic back pain while dressing. She does not have any PE risk factors and there were no associated neurologic symptoms of paresthesia numbness or weakness. Associated cardiac ischemic symptomology. At this time the patient will be treated with naproxen and Norflex for musculoskeletal strain. She is counseled to return if there should be any new or concerning symptoms.   Arby BarretteMarcy Jocelin Schuelke, MD 02/20/15 [phone]

## 2015-02-19 NOTE — ED Notes (Signed)
Pt reports sharps pain starting in her left shoulder blade radiating to her back. Pt states it is a constant dull ache but when she moves it turns into a sharp pain.

## 2015-02-20 MED ORDER — ORPHENADRINE CITRATE ER 100 MG PO TB12: 100.0000 mg | ORAL_TABLET | Freq: Two times a day (BID) | ORAL | Status: DC

## 2015-02-20 MED ORDER — NAPROXEN 500 MG PO TABS: 500.0000 mg | ORAL_TABLET | Freq: Two times a day (BID) | ORAL | Status: DC

## 2015-02-20 NOTE — Discharge Instructions (Signed)

## 2015-10-25 ENCOUNTER — Encounter (HOSPITAL_COMMUNITY): Payer: Self-pay | Admitting: *Deleted

## 2015-10-25 ENCOUNTER — Emergency Department (HOSPITAL_COMMUNITY): Payer: BLUE CROSS/BLUE SHIELD

## 2015-10-25 ENCOUNTER — Emergency Department (EMERGENCY_DEPARTMENT_HOSPITAL)
Admission: RE | Admit: 2015-10-25 | Discharge: 2015-10-25 | Disposition: A | Discharge status: Home / Self Care | Payer: BLUE CROSS/BLUE SHIELD | Source: Ambulatory Visit | Attending: Emergency Medicine | Admitting: Emergency Medicine

## 2015-10-25 ENCOUNTER — Emergency Department (HOSPITAL_COMMUNITY)
Admission: EM | Admit: 2015-10-25 | Discharge: 2015-10-25 | Disposition: A | Discharge status: Home / Self Care | Payer: BLUE CROSS/BLUE SHIELD | Attending: Emergency Medicine | Admitting: Emergency Medicine

## 2015-10-25 DIAGNOSIS — R2232 Localized swelling, mass and lump, left upper limb: Secondary | ICD-10-CM | POA: Diagnosis not present

## 2015-10-25 DIAGNOSIS — F319 Bipolar disorder, unspecified: Secondary | ICD-10-CM | POA: Insufficient documentation

## 2015-10-25 DIAGNOSIS — G43909 Migraine, unspecified, not intractable, without status migrainosus: Secondary | ICD-10-CM | POA: Insufficient documentation

## 2015-10-25 DIAGNOSIS — L03211 Cellulitis of face: Secondary | ICD-10-CM | POA: Insufficient documentation

## 2015-10-25 DIAGNOSIS — R519 Headache, unspecified: Secondary | ICD-10-CM

## 2015-10-25 DIAGNOSIS — M7989 Other specified soft tissue disorders: Secondary | ICD-10-CM

## 2015-10-25 DIAGNOSIS — E785 Hyperlipidemia, unspecified: Secondary | ICD-10-CM | POA: Diagnosis not present

## 2015-10-25 DIAGNOSIS — Z79899 Other long term (current) drug therapy: Secondary | ICD-10-CM | POA: Insufficient documentation

## 2015-10-25 DIAGNOSIS — R51 Headache: Secondary | ICD-10-CM

## 2015-10-25 DIAGNOSIS — R220 Localized swelling, mass and lump, head: Secondary | ICD-10-CM

## 2015-10-25 LAB — HEPATIC FUNCTION PANEL
ALK PHOS: 87 U/L (ref 38–126)
ALT: 22 U/L (ref 14–54)
AST: 21 U/L (ref 15–41)
Albumin: 3.3 g/dL — ABNORMAL LOW (ref 3.5–5.0)
BILIRUBIN INDIRECT: 0.5 mg/dL (ref 0.3–0.9)
Bilirubin, Direct: 0.2 mg/dL (ref 0.1–0.5)
TOTAL PROTEIN: 6.4 g/dL — AB (ref 6.5–8.1)
Total Bilirubin: 0.7 mg/dL (ref 0.3–1.2)

## 2015-10-25 LAB — CBC WITH DIFFERENTIAL/PLATELET
Basophils Absolute: 0 10*3/uL (ref 0.0–0.1)
Basophils Relative: 0 %
EOS PCT: 2 %
Eosinophils Absolute: 0.2 10*3/uL (ref 0.0–0.7)
HCT: 40.1 % (ref 36.0–46.0)
Hemoglobin: 13.2 g/dL (ref 12.0–15.0)
LYMPHS PCT: 23 %
Lymphs Abs: 2.3 10*3/uL (ref 0.7–4.0)
MCH: 30.3 pg (ref 26.0–34.0)
MCHC: 32.9 g/dL (ref 30.0–36.0)
MCV: 92.2 fL (ref 78.0–100.0)
MONO ABS: 1 10*3/uL (ref 0.1–1.0)
MONOS PCT: 10 %
Neutro Abs: 6.8 10*3/uL (ref 1.7–7.7)
Neutrophils Relative %: 65 %
Platelets: 265 10*3/uL (ref 150–400)
RBC: 4.35 MIL/uL (ref 3.87–5.11)
RDW: 13.4 % (ref 11.5–15.5)
WBC: 10.4 10*3/uL (ref 4.0–10.5)

## 2015-10-25 LAB — BASIC METABOLIC PANEL
Anion gap: 9 (ref 5–15)
BUN: 15 mg/dL (ref 6–20)
CALCIUM: 9.4 mg/dL (ref 8.9–10.3)
CO2: 20 mmol/L — ABNORMAL LOW (ref 22–32)
Chloride: 109 mmol/L (ref 101–111)
Creatinine, Ser: 0.83 mg/dL (ref 0.44–1.00)
GFR calc Af Amer: >60 mL/min (ref >60)
GFR calc non Af Amer: >60 mL/min (ref >60)
GLUCOSE: 105 mg/dL — AB (ref 65–99)
Potassium: 3.7 mmol/L (ref 3.5–5.1)
SODIUM: 138 mmol/L (ref 135–145)

## 2015-10-25 LAB — URINALYSIS, ROUTINE W REFLEX MICROSCOPIC
BILIRUBIN URINE: NEGATIVE
Glucose, UA: NEGATIVE mg/dL
HGB URINE DIPSTICK: NEGATIVE
Ketones, ur: NEGATIVE mg/dL
Leukocytes, UA: NEGATIVE
Nitrite: NEGATIVE
PROTEIN: NEGATIVE mg/dL
Specific Gravity, Urine: 1.019 (ref 1.005–1.030)
pH: 6 (ref 5.0–8.0)

## 2015-10-25 MED ORDER — HYDROCODONE-ACETAMINOPHEN 5-325 MG PO TABS
1.0000 | ORAL_TABLET | Freq: Once | ORAL | Status: AC
  Administered 2015-10-25: 1 via ORAL
  Filled 2015-10-25: qty 1

## 2015-10-25 MED ORDER — CLINDAMYCIN PHOSPHATE 900 MG/50ML IV SOLN
900.0000 mg | Freq: Once | INTRAVENOUS | Status: AC
  Administered 2015-10-25: 900 mg via INTRAVENOUS
  Filled 2015-10-25: qty 50

## 2015-10-25 MED ORDER — ONDANSETRON HCL 4 MG/2ML IJ SOLN
4.0000 mg | Freq: Once | INTRAMUSCULAR | Status: AC
  Administered 2015-10-25: 4 mg via INTRAVENOUS

## 2015-10-25 MED ORDER — IOHEXOL 300 MG/ML  SOLN
80.0000 mL | Freq: Once | INTRAMUSCULAR | Status: AC | PRN
  Administered 2015-10-25: 80 mL via INTRAVENOUS

## 2015-10-25 MED ORDER — ONDANSETRON 4 MG PO TBDP: 4.0000 mg | ORAL_TABLET | Freq: Three times a day (TID) | ORAL | Status: DC | PRN

## 2015-10-25 MED ORDER — CLINDAMYCIN HCL 300 MG PO CAPS: 300.0000 mg | ORAL_CAPSULE | Freq: Four times a day (QID) | ORAL | Status: DC

## 2015-10-25 MED ORDER — HYDROMORPHONE HCL 1 MG/ML IJ SOLN
1.0000 mg | INTRAMUSCULAR | Status: DC | PRN
  Administered 2015-10-25: 1 mg via INTRAVENOUS
  Filled 2015-10-25: qty 1

## 2015-10-25 MED ORDER — SODIUM CHLORIDE 0.9 % IV SOLN
Freq: Once | INTRAVENOUS | Status: AC
  Administered 2015-10-25: 12:00:00 via INTRAVENOUS

## 2015-10-25 MED ORDER — HYDROCODONE-ACETAMINOPHEN 5-325 MG PO TABS: 2.0000 | ORAL_TABLET | ORAL | Status: DC | PRN

## 2015-10-25 MED ORDER — ONDANSETRON HCL 4 MG/2ML IJ SOLN
4.0000 mg | Freq: Once | INTRAMUSCULAR | Status: DC
  Filled 2015-10-25: qty 2

## 2015-10-25 NOTE — Discharge Instructions (Signed)
Recheck Tuesday, or Wednesday with your primary care, or you are if not improving. Re check ER with any worsening symptoms  Cellulitis Cellulitis is an infection of the skin and the tissue beneath it. The infected area is usually red and tender. Cellulitis occurs most often in the arms and lower legs.  CAUSES  Cellulitis is caused by bacteria that enter the skin through cracks or cuts in the skin. The most common types of bacteria that cause cellulitis are staphylococci and streptococci. SIGNS AND SYMPTOMS   Redness and warmth.  Swelling.  Tenderness or pain.  Fever. DIAGNOSIS  Your health care provider can usually determine what is wrong based on a physical exam. Blood tests may also be done. TREATMENT  Treatment usually involves taking an antibiotic medicine. HOME CARE INSTRUCTIONS   Take your antibiotic medicine as directed by your health care provider. Finish the antibiotic even if you start to feel better.  Keep the infected arm or leg elevated to reduce swelling.  Apply a warm cloth to the affected area up to 4 times per day to relieve pain.  Take medicines only as directed by your health care provider.  Keep all follow-up visits as directed by your health care provider. SEEK MEDICAL CARE IF:   You notice red streaks coming from the infected area.  Your red area gets larger or turns dark in color.  Your bone or joint underneath the infected area becomes painful after the skin has healed.  Your infection returns in the same area or another area.  You notice a swollen bump in the infected area.  You develop new symptoms.  You have a fever. SEEK IMMEDIATE MEDICAL CARE IF:   You feel very sleepy.  You develop vomiting or diarrhea.  You have a general ill feeling (malaise) with muscle aches and pains.   This information is not intended to replace advice given to you by your health care provider. Make sure you discuss any questions you have with your health care  provider.   Document Released: 08/03/2005 Document Revised: 07/15/2015 Document Reviewed: 01/09/2012 Elsevier Interactive Patient Education [employer]2016 [employer] Inc[employer]

## 2015-10-25 NOTE — ED Provider Notes (Signed)
CSN: [id number]     Arrival date & time 10/25/15  0319 History   First MD Initiated Contact with Patient 10/25/15 0515     Chief Complaint  Patient presents with  . Facial Swelling     (Consider location/radiation/quality/duration/timing/severity/associated sxs/prior Treatment) HPI Comments: Pt comes in with facial swelling. Pt has no significant medical hx. She reports noticing L sided facial swelling, and redness with some pain 2 days ago. Overtime the swelling has worsened. She denies any toothache, jaw pain, ear ache, uri like sx. No n/v/f/c. Pt has no hx of ivda, smoking, facial trauma. Upon further exam - her LUE is also swollen compared to the contralateral side. She denies any liver dx, renal dz. No chest pain, dib.   ROS 10 Systems reviewed and are negative for acute change except as noted in the HPI.     The history is provided by the patient.    Past Medical History  Diagnosis Date  . Bipolar 2 disorder (HCC)   . History of syncope 2002    Vasovagal  . Hyperlipidemia   . Migraines   . GERD (gastroesophageal reflux disease)   . Potassium (K) deficiency     per pt K+ deficiency - ? from meds she takes ( none of which are diuretucs)  . H/O chest pain     dx'd with GERD  . SVD (spontaneous vaginal delivery)     x 2   Past Surgical History  Procedure Laterality Date  . Cesarean section      x 1  . Cholecystectomy  07/27/2012    Procedure: LAPAROSCOPIC CHOLECYSTECTOMY;  Surgeon: Shelly Rubenstein, MD;  Location: [facility];  Service: General;  Laterality: N/A;  . Tubal ligation      x 2  . Wisdom tooth extraction     Family History  Problem Relation Age of Onset  . Dilated cardiomyopathy Mother     with ICD  . Lung cancer    . Breast cancer    . Diabetes type II Father    Social History  Substance Use Topics  . Smoking status: Never Smoker   . Smokeless tobacco: Never Used  . Alcohol Use: No   OB History    No data available     Review of  Systems    Allergies  Codeine  Home Medications   Prior to Admission medications   Medication Sig Start Date End Date Taking? Authorizing Provider  ARIPiprazole (ABILIFY) 10 MG tablet Take 10 mg by mouth at bedtime.   Yes Historical Provider, MD  DULoxetine (CYMBALTA) 60 MG capsule Take 60 mg by mouth at bedtime.    Yes Historical Provider, MD  OLANZapine (ZYPREXA) 5 MG tablet Take 5 mg by mouth at bedtime.   Yes Historical Provider, MD  topiramate (TOPAMAX) 100 MG tablet Take 100 mg by mouth at bedtime.    Yes Historical Provider, MD  cephALEXin (KEFLEX) 250 MG capsule Take 1 capsule (250 mg total) by mouth 4 (four) times daily. Patient not taking: Reported on 02/19/2015 08/27/13   Earley Favor, NP  cephALEXin (KEFLEX) 500 MG capsule Take 1 capsule (500 mg total) by mouth 2 (two) times daily. Patient not taking: Reported on 02/19/2015 08/27/13   Earley Favor, NP  mupirocin cream (BACTROBAN) 2 % Apply topically 2 (two) times daily. To toes Patient not taking: Reported on 02/19/2015 08/27/13   Earley Favor, NP  naproxen (NAPROSYN) 500 MG tablet Take 1 tablet (500 mg total) by mouth 2 (  two) times daily. Patient not taking: Reported on 10/25/2015 02/20/15   Arby BarretteMarcy Pfeiffer, MD  orphenadrine (NORFLEX) 100 MG tablet Take 1 tablet (100 mg total) by mouth 2 (two) times daily. Patient not taking: Reported on 10/25/2015 02/20/15   Arby BarretteMarcy Pfeiffer, MD   BP 115/52 mmHg  Pulse 81  Temp(Src) 97.6 F (36.4 C)  Resp 16  Ht 5' 2.5" (1.588 m)  Wt 249 lb (112.946 kg)  BMI 44.79 kg/m2  SpO2 98%  LMP 08/18/2012 Physical Exam  Constitutional: She is oriented to person, place, and time. She appears well-developed and well-nourished.  HENT:  Head: Normocephalic and atraumatic.  Mouth/Throat: No oropharyngeal exudate.  Eyes: EOM are normal. Pupils are equal, round, and reactive to light.  Neck: Neck supple. No thyromegaly present.  Cardiovascular: Normal rate, regular rhythm and normal heart sounds.   No  murmur heard. Pulmonary/Chest: Effort normal. No respiratory distress.  Abdominal: Soft. She exhibits no distension. There is no tenderness. There is no rebound and no guarding.  Lymphadenopathy:    She has no cervical adenopathy.  Neurological: She is alert and oriented to person, place, and time.  Skin: Skin is warm and dry. Rash noted.  L sided facial swelling, erythema with callor. LUE has unilateral swelling  Nursing note and vitals reviewed.   ED Course  Procedures (including critical care time) Labs Review Labs Reviewed  BASIC METABOLIC PANEL - Abnormal; Notable for the following:    CO2 20 (*)    Glucose, Bld 105 (*)    All other components within normal limits  HEPATIC FUNCTION PANEL - Abnormal; Notable for the following:    Total Protein 6.4 (*)    Albumin 3.3 (*)    All other components within normal limits  CBC WITH DIFFERENTIAL/PLATELET  URINALYSIS, ROUTINE W REFLEX MICROSCOPIC ([facility])    Imaging Review Dg Chest 2 View  10/25/2015  CLINICAL DATA:  Unexplained swelling to the left side of the face and left arm. Nonsmoker. EXAM: CHEST  2 VIEW COMPARISON:  02/19/2015 FINDINGS: Slightly shallow inspiration with linear atelectasis in the lung bases. Normal heart size and pulmonary vascularity. No focal airspace disease or consolidation in the lungs. No blunting of costophrenic angles. No pneumothorax. Mediastinal contours appear intact. IMPRESSION: Slightly shallow inspiration with linear atelectasis in the lung bases. No evidence of active pulmonary disease. Electronically Signed   By: Burman NievesWilliam  Stevens M.D.   On: 10/25/2015 05:54   I have personally reviewed and evaluated these images and lab results as part of my medical decision-making.   EKG Interpretation None      MDM   Final diagnoses:  Swelling of left upper extremity  Unilateral facial pain  Left facial swelling    Pt comes in with L sided facial swelling and is noted to have L sided upper  extremity swelling as well. She has no dib, no risk factors for cancer and no red flags on hx for cancer. LUE has no signs of infection.  DDX: SVC syndrome, DVT, Arterial thrombosis, lymphangitis, phlebitis, cellulitis  Plan is to get CXR, basic labs and CT chest to r/o SVC syndrome. If CT chest is neg, LUE DVT study will be ordered. If US is neg, pt will be discharged with antibiotics and close pcp f/u.  Dr. Fayrene FearingJames will f/u on the plan recorded above. Pt aware of the workup we have initiated.   Derwood KaplanAnkit Murphy Duzan, MD 10/25/15 [phone]

## 2015-10-25 NOTE — ED Notes (Signed)
Pt's father arrived to pick up pt.

## 2015-10-25 NOTE — ED Notes (Signed)
Patient states Friday evening she noticed tingling to the left side of her face and swelling.  Denies any trouble with her teeth.  Stated she took a Naproxsyn this morning and this evening and it took the heat out but the swelling continues

## 2015-10-25 NOTE — Progress Notes (Signed)
VASCULAR LAB PRELIMINARY  PRELIMINARY  PRELIMINARY  PRELIMINARY  Left upper extremity venous duplex completed.    Preliminary report:  There is no DVT or SVT noted in the left upper extremity.   Arilynn Blakeney, RVT 10/25/2015, 10:23 AM

## 2015-10-25 NOTE — ED Notes (Signed)
Pt returns from radiology. 

## 2015-10-25 NOTE — ED Provider Notes (Signed)
Pt seen and examined.  D/W Dr. Rhunette CroftNanavati.  Pt with Lt maxilla/facial pain and STS for 36 hours.  CT and LUE US without DVT or SVC clot.  No dental source.  CN including vision, pupil, and EOM intact.  No vesicles to suggest zoster.  Not well demarcated to suggest erysipelas.  NO neck STS or induration.  DX is facial cellulitis.  Given IV Clindamycin and pain meds.  Asked to Recheck with PCP Dr. Renato Gailseed or ER if not showing improvement.  Rolland PorterMark Dustee Bottenfield, MD 10/25/15 1124

## 2015-10-25 NOTE — ED Notes (Signed)
Pt notified spouse of location in ED and d/c plans so may pick her up.

## 2015-10-25 NOTE — ED Notes (Addendum)
Pt states acute onset L orbit throbbing and nausea.  Denies vision changes, photophobia.  PERRL.  Dr Fayrene FearingJames is aware.

## 2015-11-05 ENCOUNTER — Other Ambulatory Visit: Payer: Self-pay

## 2015-11-05 ENCOUNTER — Encounter (HOSPITAL_COMMUNITY): Payer: Self-pay | Admitting: *Deleted

## 2015-11-05 ENCOUNTER — Emergency Department (HOSPITAL_COMMUNITY): Payer: BLUE CROSS/BLUE SHIELD

## 2015-11-05 ENCOUNTER — Emergency Department (HOSPITAL_COMMUNITY)
Admission: EM | Admit: 2015-11-05 | Discharge: 2015-11-05 | Disposition: A | Discharge status: Home / Self Care | Payer: BLUE CROSS/BLUE SHIELD | Attending: Emergency Medicine | Admitting: Emergency Medicine

## 2015-11-05 DIAGNOSIS — R42 Dizziness and giddiness: Secondary | ICD-10-CM | POA: Insufficient documentation

## 2015-11-05 DIAGNOSIS — Z8719 Personal history of other diseases of the digestive system: Secondary | ICD-10-CM | POA: Insufficient documentation

## 2015-11-05 DIAGNOSIS — Z79899 Other long term (current) drug therapy: Secondary | ICD-10-CM | POA: Insufficient documentation

## 2015-11-05 DIAGNOSIS — R110 Nausea: Secondary | ICD-10-CM | POA: Diagnosis not present

## 2015-11-05 DIAGNOSIS — M542 Cervicalgia: Secondary | ICD-10-CM | POA: Insufficient documentation

## 2015-11-05 DIAGNOSIS — F319 Bipolar disorder, unspecified: Secondary | ICD-10-CM | POA: Insufficient documentation

## 2015-11-05 DIAGNOSIS — Z8639 Personal history of other endocrine, nutritional and metabolic disease: Secondary | ICD-10-CM | POA: Insufficient documentation

## 2015-11-05 DIAGNOSIS — M79622 Pain in left upper arm: Secondary | ICD-10-CM | POA: Diagnosis not present

## 2015-11-05 DIAGNOSIS — M79602 Pain in left arm: Secondary | ICD-10-CM

## 2015-11-05 DIAGNOSIS — M546 Pain in thoracic spine: Secondary | ICD-10-CM | POA: Insufficient documentation

## 2015-11-05 DIAGNOSIS — G43909 Migraine, unspecified, not intractable, without status migrainosus: Secondary | ICD-10-CM | POA: Diagnosis not present

## 2015-11-05 LAB — CBC WITH DIFFERENTIAL/PLATELET
Basophils Absolute: 0 10*3/uL (ref 0.0–0.1)
Basophils Relative: 0 %
EOS PCT: 3 %
Eosinophils Absolute: 0.2 10*3/uL (ref 0.0–0.7)
HCT: 36 % (ref 36.0–46.0)
HEMOGLOBIN: 11.8 g/dL — AB (ref 12.0–15.0)
LYMPHS ABS: 1.9 10*3/uL (ref 0.7–4.0)
LYMPHS PCT: 38 %
MCH: 30.3 pg (ref 26.0–34.0)
MCHC: 32.8 g/dL (ref 30.0–36.0)
MCV: 92.3 fL (ref 78.0–100.0)
MONOS PCT: 6 %
Monocytes Absolute: 0.3 10*3/uL (ref 0.1–1.0)
NEUTROS PCT: 53 %
Neutro Abs: 2.7 10*3/uL (ref 1.7–7.7)
Platelets: 240 10*3/uL (ref 150–400)
RBC: 3.9 MIL/uL (ref 3.87–5.11)
RDW: 13.3 % (ref 11.5–15.5)
WBC: 5.1 10*3/uL (ref 4.0–10.5)

## 2015-11-05 LAB — URINALYSIS, ROUTINE W REFLEX MICROSCOPIC
Bilirubin Urine: NEGATIVE
GLUCOSE, UA: NEGATIVE mg/dL
HGB URINE DIPSTICK: NEGATIVE
Ketones, ur: NEGATIVE mg/dL
Nitrite: NEGATIVE
PH: 7 (ref 5.0–8.0)
Protein, ur: NEGATIVE mg/dL
SPECIFIC GRAVITY, URINE: 1.018 (ref 1.005–1.030)

## 2015-11-05 LAB — BASIC METABOLIC PANEL
Anion gap: 8 (ref 5–15)
BUN: 11 mg/dL (ref 6–20)
CHLORIDE: 112 mmol/L — AB (ref 101–111)
CO2: 22 mmol/L (ref 22–32)
Calcium: 8.9 mg/dL (ref 8.9–10.3)
Creatinine, Ser: 0.89 mg/dL (ref 0.44–1.00)
GFR calc Af Amer: >60 mL/min (ref >60)
GFR calc non Af Amer: >60 mL/min (ref >60)
GLUCOSE: 98 mg/dL (ref 65–99)
POTASSIUM: 4.7 mmol/L (ref 3.5–5.1)
Sodium: 142 mmol/L (ref 135–145)

## 2015-11-05 LAB — URINE MICROSCOPIC-ADD ON

## 2015-11-05 LAB — I-STAT TROPONIN, ED
TROPONIN I, POC: 0 ng/mL (ref 0.00–0.08)
TROPONIN I, POC: 0 ng/mL (ref 0.00–0.08)

## 2015-11-05 MED ORDER — SODIUM CHLORIDE 0.9 % IV BOLUS (SEPSIS)
1000.0000 mL | Freq: Once | INTRAVENOUS | Status: AC
  Administered 2015-11-05: 1000 mL via INTRAVENOUS

## 2015-11-05 MED ORDER — ONDANSETRON HCL 4 MG/2ML IJ SOLN
4.0000 mg | Freq: Once | INTRAMUSCULAR | Status: AC
  Administered 2015-11-05: 4 mg via INTRAVENOUS
  Filled 2015-11-05: qty 2

## 2015-11-05 MED ORDER — ONDANSETRON HCL 4 MG PO TABS: 4.0000 mg | ORAL_TABLET | Freq: Four times a day (QID) | ORAL | Status: DC

## 2015-11-05 NOTE — ED Notes (Signed)
Attempted lab draw x 2 without success; phlebotomy notified

## 2015-11-05 NOTE — ED Notes (Signed)
Patient transported to X-ray 

## 2015-11-05 NOTE — ED Notes (Signed)
Pt reports onset 30 mins ago of left side neck pain and into left arm. Pt has nausea, feels weak and near syncopal. ekg done at triage, no acute distress noted.

## 2015-11-05 NOTE — ED Provider Notes (Signed)
CSN: [id number]     Arrival date & time 11/05/15  1000 History   First MD Initiated Contact with Patient 11/05/15 1011     Chief Complaint  Patient presents with  . Arm Pain  . Dizziness   HPI  Alexandra Stein is a 49 year old female with PMHx of bipolar, HLD, migraines and GERD presenting with neck pain, dizziness and nausea. She reports onset of the symptoms approximately one hour ago. The symptoms came on acutely while she was sitting at her desk. She states that the pain in her neck is left-sided and radiates into her left arm and left lateral thoracic back. She states that the pain is intermittent. When the pain is present it is sharp and shooting and lasts for a few minutes. Between episodes she states that her left side "just feels weird". The neck and arm pain are exacerbated by movement. She also states that she feels "heavy" from the waist up. Before onset of her neck pain, she states that she became acutely nauseous with lightheadedness. She states that she feels like she is about to pass out. Currently, she is laying in a stretcher and states that she still feels like she is about to pass out. She notes that she was seen in the emergency department approximately 10 days ago for an infection of her face. She states that she had left-sided facial swelling and arm swelling and she was diagnosed with facial cellulitis and given clindamycin. She reports full resolution of her symptoms since. Denies fevers, chills, headaches, visual disturbances, room-spinning dizziness, facial swelling, sore throat, speech slurred, facial droop, chest pain, shortness of breath, abdominal pain, vomiting diarrhea, numbness of the extremities, weakness of the extremities or unsteady gait. She has no known cardiac or vascular history.  Past Medical History  Diagnosis Date  . Bipolar 2 disorder (HCC)   . History of syncope 2002    Vasovagal  . Hyperlipidemia   . Migraines   . GERD (gastroesophageal reflux disease)     . Potassium (K) deficiency     per pt K+ deficiency - ? from meds she takes ( none of which are diuretucs)  . H/O chest pain     dx'd with GERD  . SVD (spontaneous vaginal delivery)     x 2   Past Surgical History  Procedure Laterality Date  . Cesarean section      x 1  . Cholecystectomy  07/27/2012    Procedure: LAPAROSCOPIC CHOLECYSTECTOMY;  Surgeon: Shelly Rubenstein, MD;  Location: [facility];  Service: General;  Laterality: N/A;  . Tubal ligation      x 2  . Wisdom tooth extraction     Family History  Problem Relation Age of Onset  . Dilated cardiomyopathy Mother     with ICD  . Lung cancer    . Breast cancer    . Diabetes type II Father    Social History  Substance Use Topics  . Smoking status: Never Smoker   . Smokeless tobacco: Never Used  . Alcohol Use: No   OB History    No data available     Review of Systems  Constitutional: Negative for fever and chills.  HENT: Negative.   Eyes: Negative for visual disturbance.  Respiratory: Negative for cough and shortness of breath.   Cardiovascular: Negative for chest pain.  Gastrointestinal: Positive for nausea. Negative for vomiting, abdominal pain and diarrhea.  Genitourinary: Negative.   Musculoskeletal: Positive for myalgias and neck pain.  Skin: Negative for rash.  Neurological: Positive for light-headedness. Negative for syncope, facial asymmetry, speech difficulty, weakness, numbness and headaches.  All other systems reviewed and are negative.     Allergies  Codeine  Home Medications   Prior to Admission medications   Medication Sig Start Date End Date Taking? Authorizing Provider  ARIPiprazole (ABILIFY) 10 MG tablet Take 10 mg by mouth at bedtime.   Yes Historical Provider, MD  DULoxetine (CYMBALTA) 60 MG capsule Take 60 mg by mouth at bedtime.    Yes Historical Provider, MD  HYDROcodone-acetaminophen (NORCO/VICODIN) 5-325 MG tablet Take 2 tablets by mouth every 4 (four) hours as needed. 10/25/15  Yes  Rolland PorterMark James, MD  OLANZapine (ZYPREXA) 5 MG tablet Take 5 mg by mouth at bedtime.   Yes Historical Provider, MD  topiramate (TOPAMAX) 100 MG tablet Take 100 mg by mouth at bedtime.    Yes Historical Provider, MD  ondansetron (ZOFRAN) 4 MG tablet Take 1 tablet (4 mg total) by mouth every 6 (six) hours. 11/05/15   Aramis Zobel, PA-C   BP 108/76 mmHg  Pulse 75  Temp(Src) 98.1 F (36.7 C) (Oral)  Resp 16  Ht 5\' 2"  (1.575 m)  Wt 113.853 kg  BMI 45.90 kg/m2  SpO2 99%  LMP 08/18/2012 Physical Exam  Constitutional: She is oriented to person, place, and time. She appears well-developed and well-nourished. No distress.  HENT:  Head: Normocephalic and atraumatic.  Mouth/Throat: Oropharynx is clear and moist.  Eyes: Conjunctivae and EOM are normal. Pupils are equal, round, and reactive to light. Right eye exhibits no discharge. Left eye exhibits no discharge. No scleral icterus.  Neck: Normal range of motion. Neck supple. Muscular tenderness present. No spinous process tenderness present. No rigidity. Normal range of motion present.    Cardiovascular: Normal rate, regular rhythm, normal heart sounds and intact distal pulses.   Radial pulse palpable. Cap refill < 2 seconds  Pulmonary/Chest: Effort normal and breath sounds normal. No respiratory distress.  Musculoskeletal: Normal range of motion.       Thoracic back: She exhibits tenderness. She exhibits normal range of motion and no deformity.       Back:       Left upper arm: She exhibits tenderness.  Diffuse tenderness over the left lateral neck, upper extremity and lateral back as indicated in diagram. Full range of motion of the left upper extremity, cervical spine and thoracic spine intact. No localization of pain over the cervical or thoracic spinous processes. No obvious edema or deformities. She moves all extremities spontaneously and seemingly without pain.  Neurological: She is alert and oriented to person, place, and time. No cranial  nerve deficit. Coordination normal.  Cranial nerves 3 through 12 tested and intact. No facial droop or speech slur. 5/5 strength in all major muscle groups. Sensation to light touch intact throughout.   Skin: Skin is warm and dry.  No erythema, vesicles, pustules, skin breakdown or other changes of the skin noted over the left arm or back.  Psychiatric: She has a normal mood and affect. Her behavior is normal.  Nursing note and vitals reviewed.   ED Course  Procedures (including critical care time) Labs Review Labs Reviewed  CBC WITH DIFFERENTIAL/PLATELET - Abnormal; Notable for the following:    Hemoglobin 11.8 (*)    All other components within normal limits  BASIC METABOLIC PANEL - Abnormal; Notable for the following:    Chloride 112 (*)    All other components within normal limits  URINALYSIS, ROUTINE W REFLEX MICROSCOPIC (NOT AT [facility]) - Abnormal; Notable for the following:    APPearance CLOUDY (*)    Leukocytes, UA TRACE (*)    All other components within normal limits  URINE MICROSCOPIC-ADD ON - Abnormal; Notable for the following:    Squamous Epithelial / LPF 6-30 (*)    Bacteria, UA FEW (*)    All other components within normal limits  I-STAT TROPOININ, ED  Rosezena Sensor, ED    Imaging Review Dg Chest 2 View  11/05/2015  CLINICAL DATA:  49 year old female with chest pain, shortness of breath and nausea since this morning. EXAM: CHEST  2 VIEW COMPARISON:  Multiple priors, most recently chest x-ray 10/25/2015. FINDINGS: Lung volumes are normal. No consolidative airspace disease. No pleural effusions. No pneumothorax. No pulmonary nodule or mass noted. Pulmonary vasculature and the cardiomediastinal silhouette are within normal limits. IMPRESSION: No radiographic evidence of acute cardiopulmonary disease. Electronically Signed   By: Trudie Reed M.D.   On: 11/05/2015 12:37   I have personally reviewed and evaluated these images and lab results as part of my medical  decision-making.   EKG Interpretation None      MDM   Final diagnoses:  Left-sided thoracic back pain  Neck pain  Left arm pain  Lightheaded  Nausea   49 year old female presenting with nausea, lightheadedness and left sided neck, arm and back pain. All symptoms onset approximately 1 hour prior to arrival. Vital signs stable. Patient is nontoxic-appearing. Nonfocal neurological exam. Tenderness of the neck, arm and back is reproducible with palpation. Full range of motion of neck, upper extremity and back intact. No obvious edema or deformity. No skin changes suggesting shingles or other rashes. Blood work unremarkable. Troponin 0.00 with a nonischemic EKG. UA with trace leukocytes and few bacteria. Patient denies GU symptoms; UA likely contamination but we will send for culture. Chest x-ray negative. Will give IV fluids and Zofran then reassess.  1:30 PM - Reassessed pt. She reports improvement in nausea and lightheadedness after bolus and zofran. No reoccurring neck, arm or back pain. Repeat neuro exam non-focal. Heart RRR. Lungs CTAB. Will get delta trop at 4 PM. Symptoms are atypical and do not appear to be cardiac or neurologic in origin. Patient discussed with Dr. Clarene Duke who agrees with assessment and plan.   3:45 - Reassessed pt prior to troponin draw. Exam unchanged. Pt reports resolution of symptoms. She is resting comfortably, eating a [place] sandwich and drinking water. Will sign out to oncoming provider, Dr. Rachelle Hora, who will follow up troponin. Anticipate discharge if delta trop negative with close PCP follow up. Discussed this with patient who states understanding and agrees with plan.     Alveta Heimlich, PA-C 11/05/15 1912  Laurence Spates, MD 11/06/15 [phone]

## 2015-11-05 NOTE — ED Notes (Signed)
Lab called to collect blood due to difficult stick

## 2015-11-05 NOTE — Discharge Instructions (Signed)
Call your PCP to schedule a follow up appointment. Keep your fluid intake up.    Dizziness Dizziness is a common problem. It is a feeling of unsteadiness or light-headedness. You may feel like you are about to faint. Dizziness can lead to injury if you stumble or fall. Anyone can become dizzy, but dizziness is more common in older adults. This condition can be caused by a number of things, including medicines, dehydration, or illness. HOME CARE INSTRUCTIONS Taking these steps may help with your condition: Eating and Drinking  Drink enough fluid to keep your urine clear or pale yellow. This helps to keep you from becoming dehydrated. Try to drink more clear fluids, such as water.  Do not drink alcohol.  Limit your caffeine intake if directed by your health care provider.  Limit your salt intake if directed by your health care provider. Activity  Avoid making quick movements.  Rise slowly from chairs and steady yourself until you feel okay.  In the morning, first sit up on the side of the bed. When you feel okay, stand slowly while you hold onto something until you know that your balance is fine.  Move your legs often if you need to stand in one place for a long time. Tighten and relax your muscles in your legs while you are standing.  Do not drive or operate heavy machinery if you feel dizzy.  Avoid bending down if you feel dizzy. Place items in your home so that they are easy for you to reach without leaning over. Lifestyle  Do not use any tobacco products, including cigarettes, chewing tobacco, or electronic cigarettes. If you need help quitting, ask your health care provider.  Try to reduce your stress level, such as with yoga or meditation. Talk with your health care provider if you need help. General Instructions  Watch your dizziness for any changes.  Take medicines only as directed by your health care provider. Talk with your health care provider if you think that your  dizziness is caused by a medicine that you are taking.  Tell a friend or a family member that you are feeling dizzy. If he or she notices any changes in your behavior, have this person call your health care provider.  Keep all follow-up visits as directed by your health care provider. This is important. SEEK MEDICAL CARE IF:  Your dizziness does not go away.  Your dizziness or light-headedness gets worse.  You feel nauseous.  You have reduced hearing.  You have new symptoms.  You are unsteady on your feet or you feel like the room is spinning. SEEK IMMEDIATE MEDICAL CARE IF:  You vomit or have diarrhea and are unable to eat or drink anything.  You have problems talking, walking, swallowing, or using your arms, hands, or legs.  You feel generally weak.  You are not thinking clearly or you have trouble forming sentences. It may take a friend or family member to notice this.  You have chest pain, abdominal pain, shortness of breath, or sweating.  Your vision changes.  You notice any bleeding.  You have a headache.  You have neck pain or a stiff neck.  You have a fever.   This information is not intended to replace advice given to you by your health care provider. Make sure you discuss any questions you have with your health care provider.   Document Released: 04/19/2001 Document Revised: 03/10/2015 Document Reviewed: 10/20/2014 Elsevier Interactive Patient Education 2016 [employer].  Nausea,  Adult Nausea is the feeling that you have an upset stomach or have to vomit. Nausea by itself is not likely a serious concern, but it may be an early sign of more serious medical problems. As nausea gets worse, it can lead to vomiting. If vomiting develops, there is the risk of dehydration.  CAUSES   Viral infections.  Food poisoning.  Medicines.  Pregnancy.  Motion sickness.  Migraine headaches.  Emotional distress.  Severe pain from any source.  Alcohol  intoxication. HOME CARE INSTRUCTIONS  Get plenty of rest.  Ask your caregiver about specific rehydration instructions.  Eat small amounts of food and sip liquids more often.  Take all medicines as told by your caregiver. SEEK MEDICAL CARE IF:  You have not improved after 2 days, or you get worse.  You have a headache. SEEK IMMEDIATE MEDICAL CARE IF:   You have a fever.  You faint.  You keep vomiting or have blood in your vomit.  You are extremely weak or dehydrated.  You have dark or bloody stools.  You have severe chest or abdominal pain. MAKE SURE YOU:  Understand these instructions.  Will watch your condition.  Will get help right away if you are not doing well or get worse.   This information is not intended to replace advice given to you by your health care provider. Make sure you discuss any questions you have with your health care provider.   Document Released: 12/01/2004 Document Revised: 11/14/2014 Document Reviewed: 07/06/2011 Elsevier Interactive Patient Education 2016 Elsevier Inc.  Musculoskeletal Pain Musculoskeletal pain is muscle and boney aches and pains. These pains can occur in any part of the body. Your caregiver may treat you without knowing the cause of the pain. They may treat you if blood or urine tests, X-rays, and other tests were normal.  CAUSES There is often not a definite cause or reason for these pains. These pains may be caused by a type of germ (virus). The discomfort may also come from overuse. Overuse includes working out too hard when your body is not fit. Boney aches also come from weather changes. Bone is sensitive to atmospheric pressure changes. HOME CARE INSTRUCTIONS   Ask when your test results will be ready. Make sure you get your test results.  Only take over-the-counter or prescription medicines for pain, discomfort, or fever as directed by your caregiver. If you were given medications for your condition, do not drive,  operate machinery or power tools, or sign legal documents for 24 hours. Do not drink alcohol. Do not take sleeping pills or other medications that may interfere with treatment.  Continue all activities unless the activities cause more pain. When the pain lessens, slowly resume normal activities. Gradually increase the intensity and duration of the activities or exercise.  During periods of severe pain, bed rest may be helpful. Lay or sit in any position that is comfortable.  Putting ice on the injured area.  Put ice in a bag.  Place a towel between your skin and the bag.  Leave the ice on for 15 to 20 minutes, 3 to 4 times a day.  Follow up with your caregiver for continued problems and no reason can be found for the pain. If the pain becomes worse or does not go away, it may be necessary to repeat tests or do additional testing. Your caregiver may need to look further for a possible cause. SEEK IMMEDIATE MEDICAL CARE IF:  You have pain that is getting  worse and is not relieved by medications.  You develop chest pain that is associated with shortness or breath, sweating, feeling sick to your stomach (nauseous), or throw up (vomit).  Your pain becomes localized to the abdomen.  You develop any new symptoms that seem different or that concern you. MAKE SURE YOU:   Understand these instructions.  Will watch your condition.  Will get help right away if you are not doing well or get worse.   This information is not intended to replace advice given to you by your health care provider. Make sure you discuss any questions you have with your health care provider.   Document Released: 10/24/2005 Document Revised: 01/16/2012 Document Reviewed: 06/28/2013 Elsevier Interactive Patient Education [employer] Elsevier Inc.

## 2016-01-17 ENCOUNTER — Encounter (HOSPITAL_COMMUNITY): Payer: Self-pay | Admitting: Emergency Medicine

## 2016-01-17 DIAGNOSIS — E785 Hyperlipidemia, unspecified: Secondary | ICD-10-CM | POA: Diagnosis not present

## 2016-01-17 DIAGNOSIS — E663 Overweight: Secondary | ICD-10-CM | POA: Insufficient documentation

## 2016-01-17 DIAGNOSIS — E876 Hypokalemia: Secondary | ICD-10-CM | POA: Diagnosis not present

## 2016-01-17 DIAGNOSIS — R42 Dizziness and giddiness: Secondary | ICD-10-CM | POA: Diagnosis not present

## 2016-01-17 DIAGNOSIS — F319 Bipolar disorder, unspecified: Secondary | ICD-10-CM | POA: Insufficient documentation

## 2016-01-17 DIAGNOSIS — Z3202 Encounter for pregnancy test, result negative: Secondary | ICD-10-CM | POA: Diagnosis not present

## 2016-01-17 DIAGNOSIS — G43909 Migraine, unspecified, not intractable, without status migrainosus: Secondary | ICD-10-CM | POA: Insufficient documentation

## 2016-01-17 DIAGNOSIS — K219 Gastro-esophageal reflux disease without esophagitis: Secondary | ICD-10-CM | POA: Insufficient documentation

## 2016-01-17 DIAGNOSIS — R110 Nausea: Secondary | ICD-10-CM | POA: Diagnosis not present

## 2016-01-17 DIAGNOSIS — R55 Syncope and collapse: Secondary | ICD-10-CM | POA: Diagnosis not present

## 2016-01-17 LAB — CBC
HEMATOCRIT: 42.8 % (ref 36.0–46.0)
Hemoglobin: 13.7 g/dL (ref 12.0–15.0)
MCH: 29.7 pg (ref 26.0–34.0)
MCHC: 32 g/dL (ref 30.0–36.0)
MCV: 92.8 fL (ref 78.0–100.0)
PLATELETS: 277 10*3/uL (ref 150–400)
RBC: 4.61 MIL/uL (ref 3.87–5.11)
RDW: 13.5 % (ref 11.5–15.5)
WBC: 10.6 10*3/uL — ABNORMAL HIGH (ref 4.0–10.5)

## 2016-01-17 LAB — URINALYSIS, ROUTINE W REFLEX MICROSCOPIC
Bilirubin Urine: NEGATIVE
GLUCOSE, UA: NEGATIVE mg/dL
HGB URINE DIPSTICK: NEGATIVE
KETONES UR: NEGATIVE mg/dL
Leukocytes, UA: NEGATIVE
Nitrite: NEGATIVE
PH: 6.5 (ref 5.0–8.0)
PROTEIN: NEGATIVE mg/dL
Specific Gravity, Urine: 1.018 (ref 1.005–1.030)

## 2016-01-17 LAB — BASIC METABOLIC PANEL
Anion gap: 12 (ref 5–15)
BUN: 12 mg/dL (ref 6–20)
CHLORIDE: 108 mmol/L (ref 101–111)
CO2: 23 mmol/L (ref 22–32)
CREATININE: 0.89 mg/dL (ref 0.44–1.00)
Calcium: 9.8 mg/dL (ref 8.9–10.3)
GFR calc non Af Amer: >60 mL/min (ref >60)
GLUCOSE: 92 mg/dL (ref 65–99)
POTASSIUM: 3.8 mmol/L (ref 3.5–5.1)
Sodium: 143 mmol/L (ref 135–145)
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
## 2016-01-18 ENCOUNTER — Encounter (HOSPITAL_COMMUNITY): Payer: Self-pay | Admitting: Radiology

## 2016-01-18 ENCOUNTER — Observation Stay (HOSPITAL_COMMUNITY)
Admission: EM | Admit: 2016-01-18 | Discharge: 2016-01-19 | Disposition: A | Discharge status: Home / Self Care | Payer: BLUE CROSS/BLUE SHIELD | Attending: Internal Medicine | Admitting: Internal Medicine

## 2016-01-18 ENCOUNTER — Observation Stay (HOSPITAL_COMMUNITY): Payer: BLUE CROSS/BLUE SHIELD

## 2016-01-18 ENCOUNTER — Emergency Department (HOSPITAL_COMMUNITY): Payer: BLUE CROSS/BLUE SHIELD

## 2016-01-18 DIAGNOSIS — R404 Transient alteration of awareness: Secondary | ICD-10-CM

## 2016-01-18 DIAGNOSIS — F329 Major depressive disorder, single episode, unspecified: Secondary | ICD-10-CM

## 2016-01-18 DIAGNOSIS — R55 Syncope and collapse: Secondary | ICD-10-CM | POA: Diagnosis not present

## 2016-01-18 DIAGNOSIS — E785 Hyperlipidemia, unspecified: Secondary | ICD-10-CM | POA: Diagnosis present

## 2016-01-18 DIAGNOSIS — F22 Delusional disorders: Secondary | ICD-10-CM | POA: Insufficient documentation

## 2016-01-18 DIAGNOSIS — F3170 Bipolar disorder, currently in remission, most recent episode unspecified: Secondary | ICD-10-CM | POA: Diagnosis not present

## 2016-01-18 DIAGNOSIS — F319 Bipolar disorder, unspecified: Secondary | ICD-10-CM | POA: Diagnosis present

## 2016-01-18 DIAGNOSIS — F32A Depression, unspecified: Secondary | ICD-10-CM | POA: Diagnosis present

## 2016-01-18 DIAGNOSIS — K219 Gastro-esophageal reflux disease without esophagitis: Secondary | ICD-10-CM | POA: Insufficient documentation

## 2016-01-18 LAB — LIPID PANEL
Cholesterol: 169 mg/dL (ref 0–200)
HDL: 55 mg/dL (ref >40)
LDL CALC: 96 mg/dL (ref 0–99)
TRIGLYCERIDES: 91 mg/dL (ref <150)
Total CHOL/HDL Ratio: 3.1 RATIO
VLDL: 18 mg/dL (ref 0–40)

## 2016-01-18 LAB — TSH: TSH: 5.173 u[IU]/mL — ABNORMAL HIGH (ref 0.350–4.500)

## 2016-01-18 LAB — RAPID URINE DRUG SCREEN, HOSP PERFORMED
AMPHETAMINES: NOT DETECTED
BENZODIAZEPINES: NOT DETECTED
Barbiturates: NOT DETECTED
Cocaine: NOT DETECTED
Opiates: NOT DETECTED
TETRAHYDROCANNABINOL: NOT DETECTED

## 2016-01-18 LAB — APTT: aPTT: 26 seconds (ref 24–37)

## 2016-01-18 LAB — TROPONIN I

## 2016-01-18 LAB — PROTIME-INR
INR: 1.01 (ref 0.00–1.49)
PROTHROMBIN TIME: 13.6 s (ref 11.6–15.2)

## 2016-01-18 LAB — HCG, QUANTITATIVE, PREGNANCY: HCG, BETA CHAIN, QUANT, S: 1 m[IU]/mL (ref <5)

## 2016-01-18 MED ORDER — ACETAMINOPHEN 650 MG RE SUPP: 650.0000 mg | Freq: Four times a day (QID) | RECTAL | Status: DC | PRN

## 2016-01-18 MED ORDER — SODIUM CHLORIDE 0.9 % IV SOLN
INTRAVENOUS | Status: DC
  Administered 2016-01-18 – 2016-01-19 (×3): via INTRAVENOUS

## 2016-01-18 MED ORDER — ONDANSETRON HCL 4 MG PO TABS: 4.0000 mg | ORAL_TABLET | Freq: Four times a day (QID) | ORAL | Status: DC | PRN

## 2016-01-18 MED ORDER — SODIUM CHLORIDE 0.9% FLUSH
3.0000 mL | Freq: Two times a day (BID) | INTRAVENOUS | Status: DC
Start: 2016-01-18 — End: 2016-01-19
  Administered 2016-01-18: 3 mL via INTRAVENOUS

## 2016-01-18 MED ORDER — ARIPIPRAZOLE 10 MG PO TABS
10.0000 mg | ORAL_TABLET | Freq: Every day | ORAL | Status: DC
  Administered 2016-01-18: 10 mg via ORAL
  Filled 2016-01-18: qty 1

## 2016-01-18 MED ORDER — ONDANSETRON HCL 4 MG/2ML IJ SOLN: 4.0000 mg | Freq: Four times a day (QID) | INTRAMUSCULAR | Status: DC | PRN

## 2016-01-18 MED ORDER — TOPIRAMATE 100 MG PO TABS
100.0000 mg | ORAL_TABLET | Freq: Every day | ORAL | Status: DC
  Administered 2016-01-18: 100 mg via ORAL
  Filled 2016-01-18 (×2): qty 1

## 2016-01-18 MED ORDER — ENOXAPARIN SODIUM 40 MG/0.4ML ~~LOC~~ SOLN
40.0000 mg | SUBCUTANEOUS | Status: DC
  Administered 2016-01-18: 40 mg via SUBCUTANEOUS
  Filled 2016-01-18 (×2): qty 0.4

## 2016-01-18 MED ORDER — DULOXETINE HCL 60 MG PO CPEP
60.0000 mg | ORAL_CAPSULE | Freq: Every day | ORAL | Status: DC
Start: 2016-01-18 — End: 2016-01-19
  Administered 2016-01-18: 60 mg via ORAL
  Filled 2016-01-18: qty 1

## 2016-01-18 MED ORDER — OLANZAPINE 5 MG PO TABS
5.0000 mg | ORAL_TABLET | Freq: Every day | ORAL | Status: DC
  Administered 2016-01-18: 5 mg via ORAL
  Filled 2016-01-18 (×2): qty 1

## 2016-01-18 MED ORDER — ACETAMINOPHEN 325 MG PO TABS
650.0000 mg | ORAL_TABLET | Freq: Four times a day (QID) | ORAL | Status: DC | PRN
  Administered 2016-01-19: 650 mg via ORAL
  Filled 2016-01-18: qty 2

## 2016-01-18 MED ORDER — SODIUM CHLORIDE 0.9 % IV BOLUS (SEPSIS)
1000.0000 mL | Freq: Once | INTRAVENOUS | Status: AC
  Administered 2016-01-18: 1000 mL via INTRAVENOUS

## 2016-01-18 NOTE — Progress Notes (Signed)
EEG completed; results pending.    

## 2016-01-18 NOTE — ED Notes (Signed)
LUNCH TRAY ORDERED 

## 2016-01-18 NOTE — Procedures (Signed)
ELECTROENCEPHALOGRAM REPORT  Date of Study: 01/18/2016  Patient's Name: Alexandra Stein MRN: [id number] Date of Birth: 14-Jan-1966  Referring Provider: Dr. Lorretta HarpXilin Niu  Clinical History: This is a 50 year old woman with episodes of syncope and near syncope.  Medications: topiramate (TOPAMAX) tablet 100 mg acetaminophen (TYLENOL) tablet 650 mg ARIPiprazole (ABILIFY) tablet 10 mg DULoxetine (CYMBALTA) DR capsule 60 mg OLANZapine (ZYPREXA) tablet 5 mg  Technical Summary: A multichannel digital EEG recording measured by the international 10-20 system with electrodes applied with paste and impedances below 5000 ohms performed in our laboratory with EKG monitoring in an awake and asleep patient.  Hyperventilation and photic stimulation were performed.  The digital EEG was referentially recorded, reformatted, and digitally filtered in a variety of bipolar and referential montages for optimal display.    Description: The patient is awake and asleep during the recording.  During maximal wakefulness, there is a symmetric, medium voltage 10 Hz posterior dominant rhythm that attenuates with eye opening.  The record is symmetric.  During drowsiness and sleep, there is an increase in theta slowing of the background, with shifting asymmetry over the bilateral temporal regions, left greater than right.  Vertex waves and symmetric sleep spindles were seen.  Hyperventilation and photic stimulation did not elicit any abnormalities.  There were no epileptiform discharges or electrographic seizures seen.    EKG lead was unremarkable.  Impression: This awake and asleep EEG is within normal limits for age.  Clinical Correlation: A normal EEG does not exclude a clinical diagnosis of epilepsy. Clinical correlation is advised.   Patrcia DollyKaren Njeri Vicente, M.D.

## 2016-01-18 NOTE — ED Notes (Signed)
Attempted to call report

## 2016-01-18 NOTE — ED Notes (Signed)
Pt updated on wait and delays in dept.
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
## 2016-01-19 ENCOUNTER — Observation Stay (HOSPITAL_BASED_OUTPATIENT_CLINIC_OR_DEPARTMENT_OTHER): Payer: BLUE CROSS/BLUE SHIELD

## 2016-01-19 DIAGNOSIS — F3170 Bipolar disorder, currently in remission, most recent episode unspecified: Secondary | ICD-10-CM | POA: Diagnosis not present

## 2016-01-19 DIAGNOSIS — R55 Syncope and collapse: Secondary | ICD-10-CM | POA: Diagnosis not present

## 2016-01-19 LAB — COMPREHENSIVE METABOLIC PANEL
ALK PHOS: 53 U/L (ref 38–126)
ALT: 20 U/L (ref 14–54)
AST: 21 U/L (ref 15–41)
Albumin: 3.1 g/dL — ABNORMAL LOW (ref 3.5–5.0)
Anion gap: 11 (ref 5–15)
BILIRUBIN TOTAL: 0.7 mg/dL (ref 0.3–1.2)
BUN: 9 mg/dL (ref 6–20)
CO2: 18 mmol/L — ABNORMAL LOW (ref 22–32)
CREATININE: 0.77 mg/dL (ref 0.44–1.00)
Calcium: 8.6 mg/dL — ABNORMAL LOW (ref 8.9–10.3)
Chloride: 112 mmol/L — ABNORMAL HIGH (ref 101–111)
GFR calc Af Amer: >60 mL/min (ref >60)
GLUCOSE: 107 mg/dL — AB (ref 65–99)
Potassium: 4.1 mmol/L (ref 3.5–5.1)
Sodium: 141 mmol/L (ref 135–145)
TOTAL PROTEIN: 5.9 g/dL — AB (ref 6.5–8.1)

## 2016-01-19 LAB — CBC
HEMATOCRIT: 40.4 % (ref 36.0–46.0)
HEMOGLOBIN: 12.8 g/dL (ref 12.0–15.0)
MCH: 29.6 pg (ref 26.0–34.0)
MCHC: 31.7 g/dL (ref 30.0–36.0)
MCV: 93.3 fL (ref 78.0–100.0)
Platelets: 200 10*3/uL (ref 150–400)
RBC: 4.33 MIL/uL (ref 3.87–5.11)
RDW: 13.5 % (ref 11.5–15.5)
WBC: 7 10*3/uL (ref 4.0–10.5)

## 2016-01-19 LAB — HEMOGLOBIN A1C
Hgb A1c MFr Bld: 5.5 % (ref 4.8–5.6)
MEAN PLASMA GLUCOSE: 111 mg/dL

## 2016-01-19 LAB — ECHOCARDIOGRAM COMPLETE
Height: 62 in
WEIGHTICAEL: 4032 [oz_av]

## 2016-01-19 LAB — GLUCOSE, CAPILLARY: GLUCOSE-CAPILLARY: 102 mg/dL — AB (ref 65–99)

## 2016-01-19 NOTE — Discharge Summary (Signed)
Physician Discharge Summary  Alexandra Stein JWJ:[id number] DOB: 1966-03-05 DOA: 01/18/2016  PCP: Lolita Patella, MD  Admit date: 01/18/2016 Discharge date: 01/19/2016  Time spent: 30 minutes  Recommendations for Outpatient Follow-up:  1. Follow up with PCP in one week, no driving, till event monitor eval is completed.  2. Follow up with neurology for memory impairment.    Discharge Diagnoses:  Principal Problem:   Syncope Active Problems:   Bipolar disorder (HCC)   Hyperlipidemia   Depression   Discharge Condition: improved  Diet recommendation: regular diet  Filed Weights   01/17/16 2032 01/18/16 1740 01/19/16 0424  Weight: 113.938 kg (251 lb 3 oz) 114.5 kg (252 lb 6.8 oz) 114.306 kg (252 lb)    History of present illness:  50 y.o. female with PMH of bipolar disorder, vasovagal syncope 2002, migraine headache, depression, GERD, hyperlipidemia, who presents with 2 episodes of syncope while driving.ED, patient was found to have negative troponin, negative UDS, negative urinalysis, WBC 10.6, temperature normal, no tachycardia, no tachypnea, electrolytes and renal function okay, negative CT head for acute intracranial abnormalities  Hospital Course:  Syncope: Etiology is not clear. . Differential diagnosis is broad, including cardiac arrhythmia, TIA/stroke, seizure, vasovagal syncope and orthostatic status. Telemetry shows normal sinus rhythm, troponin negative 1, pregnancy test negative -MRI of her brain within normal limits -2-D echo unremarkable.  -EEG is within normal limits -negative  orthostatic vital signs Neurochecks stable No preceding intercurrent illness. The only symptom patient has had in the last 1-1/2 weeks his memory loss which she is very concerned about, outpatient neurology followup. Will set up patient with event monitor. Requested Trsih with cardiology.. No driving, patient needs to stay off of for 1 week Patient needs to be cleared by PCP to be  able to go back to work Outpatient neurology referral for memory loss Patient denies use of alcohol or recreational drugs  Depression: Stable, no suicidal or homicidal ideations. -Continue home medications: Abilify, Cymbalta No recent change in medication dosages  Bipolar disorder: Stable -Continue Topamax and Olanzapin  HLD: outpatient follow up.    Procedures:  none  Consultations:  none  Discharge Exam: Filed Vitals:   01/19/16 1206 01/19/16 1653  BP: 122/75 122/64  Pulse: 88 87  Temp: 98.4 F (36.9 C) 98.4 F (36.9 C)  Resp:      General: alert comfortable Cardiovascular: s1s2 Respiratory: ctab  Discharge Instructions   Discharge Instructions    Diet - low sodium heart healthy    Complete by:  As directed      Discharge instructions    Complete by:  As directed   FOLLOW UP with PCP in one week.  Please follow up with cardiology for event monitor placement.  Please follow up with neurology for work up of memory impairment.  Please do not drive till you get your event monitor evaluated.  Your PCP has to clear you to go back to work.  Please follow up with PCP in one week.          Current Discharge Medication List    CONTINUE these medications which have NOT CHANGED   Details  ARIPiprazole (ABILIFY) 10 MG tablet Take 10 mg by mouth at bedtime.    DULoxetine (CYMBALTA) 60 MG capsule Take 60 mg by mouth at bedtime.     OLANZapine (ZYPREXA) 5 MG tablet Take 5 mg by mouth at bedtime.    topiramate (TOPAMAX) 100 MG tablet Take 100 mg by mouth at bedtime.  STOP taking these medications     HYDROcodone-acetaminophen (NORCO/VICODIN) 5-325 MG tablet      ondansetron (ZOFRAN) 4 MG tablet        Allergies  Allergen Reactions  . Codeine Other (See Comments)    Hallucinations and fever. Pt CAN take Vicodin and Percocet.   Follow-up Information    Follow up with Lolita Patella, MD. Schedule an appointment as soon as possible for a  visit in 1 week.   Specialty:  Family Medicine   Contact information:   [id number] [place] [address] [phone]       Follow up with [facility]. Schedule an appointment as soon as possible for a visit in 1 week.   Contact information:   [address] [place] [address] [phone]      Call [facility] Office.   Specialty:  Cardiology   Why:  for event monitor placement tomorrow.   Contact information:   [address], [address] [place] [address] [phone]       The results of significant diagnostics from this hospitalization (including imaging, microbiology, ancillary and laboratory) are listed below for reference.    Significant Diagnostic Studies: Ct Head Wo Contrast  01/18/2016  CLINICAL DATA:  Acute onset of 2 episodes of syncope while driving. Confusion. Initial encounter. EXAM: CT HEAD WITHOUT CONTRAST TECHNIQUE: Contiguous axial images were obtained from the base of the skull through the vertex without intravenous contrast. COMPARISON:  CT of the head performed 07/25/2011 FINDINGS: There is no evidence of acute infarction, mass lesion, or intra- or extra-axial hemorrhage on CT. The posterior fossa, including the cerebellum, brainstem and fourth ventricle, is within normal limits. The third and lateral ventricles, and basal ganglia are unremarkable in appearance. The cerebral hemispheres are symmetric in appearance, with normal gray-white differentiation. No mass effect or midline shift is seen. There is no evidence of fracture; visualized osseous structures are unremarkable in appearance. The visualized portions of the orbits are within normal limits. The paranasal sinuses and mastoid air cells are well-aerated. No significant soft tissue abnormalities are seen. IMPRESSION: Unremarkable noncontrast CT of the head. Electronically Signed   By: Roanna Raider M.D.   On: 01/18/2016 03:28    Mri Brain Without Contrast  01/18/2016  CLINICAL DATA:  Two syncopal episodes yesterday. Walking corpse syndrome. EXAM: MRI HEAD WITHOUT CONTRAST TECHNIQUE: Multiplanar, multiecho pulse sequences of the brain and surrounding structures were obtained without intravenous contrast. COMPARISON:  CT head without contrast 01/18/2016 and 07/25/2011. FINDINGS: No acute infarct, hemorrhage, or mass lesion is present. No significant extraaxial fluid collection is present. The ventricles are of normal size. The internal auditory canals are within normal limits bilaterally. The brainstem and cerebellum are unremarkable. Flow is present in the major intracranial arteries. The globes and orbits are intact. Polyps or mucous retention cysts are evident within the maxillary sinuses bilaterally. The paranasal sinuses and mastoid air cells are clear. IMPRESSION: 1. Normal MRI appearance the brain. 2. Polyps or mucous retention cysts within the maxillary sinuses bilaterally. Electronically Signed   By: Marin Roberts M.D.   On: 01/18/2016 07:50    Microbiology: No results found for this or any previous visit (from the past 240 hour(s)).   Labs: Basic Metabolic Panel:  Recent Labs Lab 01/17/16 2029 01/19/16 0504  NA 143 141  K 3.8 4.1  CL 108 112*  CO2 23 18*  GLUCOSE 92 107*  BUN 12  9  CREATININE 0.89 0.77  CALCIUM 9.8 8.6*   Liver Function Tests:  Recent Labs Lab 01/19/16 0504  AST 21  ALT 20  ALKPHOS 53  BILITOT 0.7  PROT 5.9*  ALBUMIN 3.1*   No results for input(s): LIPASE, AMYLASE in the last 168 hours. No results for input(s): AMMONIA in the last 168 hours. CBC:  Recent Labs Lab 01/17/16 2029 01/19/16 0504  WBC 10.6* 7.0  HGB 13.7 12.8  HCT 42.8 40.4  MCV 92.8 93.3  PLT 277 200   Cardiac Enzymes:  Recent Labs Lab 01/18/16 0335  TROPONINI <0.03   BNP: BNP (last 3 results) No results for input(s): BNP in the last 8760 hours.  ProBNP (last 3 results) No results  for input(s): PROBNP in the last 8760 hours.  CBG:  Recent Labs Lab 01/19/16 0732  GLUCAP 102*       SignedKathlen Mody:  Shaketta Rill MD.  [facility] 01/19/2016, 5:52 PM

## 2016-01-19 NOTE — Evaluation (Signed)
Occupational Therapy Evaluation AND Discharge  Patient Details Name: Alexandra Stein MRN: [id number] DOB: [birth year] Today's Date: 01/19/2016    History of Present Illness Pt is a 50 y/o F who had syncopal episode x2 in car and x1 when standing in bathroom (pt was able to slowly lower herself to the floor).  Normal EEG and MRI.  Workup still underway.  Pt's PMH includes Bipolar 2 disorder, potassium deficiency, chest pain,.   Clinical Impression   Patient admitted with above. Patient independent PTA. Patient currently functioning at an overall mod I to supervision level. Mod I for increased time due to generalized weakness and headache, supervision due to h/o syncope.  No additional OT needs identified, D/C from acute OT services and no additional follow-up OT needs at this time. All appropriate education provided to patient. Please re-order OT if needed.    During functional ambulation, pt with minimal complaints of dizziness, in standing BP=106/69. Pt ambulated back to room and in sitting BP=121/77. RN notified of BP readings.     Follow Up Recommendations  No OT follow up;Supervision - Intermittent    Equipment Recommendations  None recommended by OT    Recommendations for Other Services  None at this time    Precautions / Restrictions Precautions Precautions: Fall Precaution Comments: syncope Restrictions Weight Bearing Restrictions: No    Mobility Bed Mobility Overal bed mobility: Independent General bed mobility comments: No cues or physical assist needed  Transfers Overall transfer level: Needs assistance Equipment used: None Transfers: Sit to/from Stand Sit to Stand: Supervision General transfer comment: Supervision for safety due to pt's c/o headache and dizziness and h/o syncope     Balance Overall balance assessment: Needs assistance Sitting-balance support: No upper extremity supported;Feet supported Sitting balance-Leahy Scale: Normal     Standing  balance support: No upper extremity supported;During functional activity Standing balance-Leahy Scale: Good High level balance activites: Backward walking;Direction changes;Turns;Sudden stops;Head turns;Other (comment) (stepping over object) High Level Balance Comments: No instability noted w/ high level balance activities listed above    ADL Overall ADL's : Modified independent General ADL Comments: Mod I for increased time, other than that physically at baseline for ADLs. As of now, am recommending supervision while in hospital due to recent h/o syncope for patient's safety.     Vision Vision Assessment?: No apparent visual deficits Additional Comments: no change from baseline          Pertinent Vitals/Pain Pain Assessment: 0-10 Pain Score: 2  Pain Location: headache Pain Descriptors / Indicators: Headache Pain Intervention(s): Limited activity within patient's tolerance;Monitored during session     Hand Dominance Right   Extremity/Trunk Assessment Upper Extremity Assessment Upper Extremity Assessment: Generalized weakness   Lower Extremity Assessment Lower Extremity Assessment: Generalized weakness   Cervical / Trunk Assessment Cervical / Trunk Assessment: Normal   Communication Communication Communication: No difficulties   Cognition Arousal/Alertness: Awake/alert Behavior During Therapy: WFL for tasks assessed/performed Overall Cognitive Status: Within Functional Limits for tasks assessed      Exercises Exercises: Other exercises Other Exercises Other Exercises: Pt encouraged to ambulate in hallway w/ nursing staff        Home Living Family/patient expects to be discharged to:: Private residence Living Arrangements: Spouse/significant other;Children Available Help at Discharge: Family;Available PRN/intermittently (husband and two sons work/go to school during the day) Type of Home: House Home Access: Level entry     Home Layout: One level     Bathroom  Shower/Tub: IT trainer: Standard  Home Equipment: None    Prior Functioning/Environment Level of Independence: Independent  Comments: Works as a Astronomer3rd grade teacher and enjoys reading    OT Diagnosis: Generalized weakness   OT Problem List:   N/a, no acute OT needs identified at this time     OT Treatment/Interventions:   N/a, no acute OT needs identified at this time     OT Goals(Current goals can be found in the care plan section) Acute Rehab OT Goals Patient 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 OT Goal Formulation: All assessment and education complete, DC therapy  OT Frequency:   N/a, no acute OT needs identified at this time     Barriers to D/C:  None known at this time        Co-evaluation PT/OT/SLP Co-Evaluation/Treatment: Yes Reason for Co-Treatment: For patient/therapist safety PT goals addressed during session: Mobility/safety with mobility;Balance OT goals addressed during session: ADL's and self-care      End of Session Equipment Utilized During Treatment: Gait belt Nurse Communication: Mobility status;Other (comment) (recommendation that pt ambulate in hallways with staff 2-3 times per day)  Activity Tolerance: Patient tolerated treatment well Patient left: in chair;with call bell/phone within reach;with chair alarm set   Time: [id number] OT Time Calculation (min): 23 min Charges:  OT General Charges $OT Visit: 1 Procedure OT Evaluation $OT Eval Low Complexity: 1 Procedure G-Codes: OT G-codes **NOT FOR INPATIENT CLASS** Functional Limitation: Self care Self Care Current Status ([id number](G8987): At least 1 percent but less than 20 percent impaired, limited or restricted (mod I to supervision) Self Care Goal Status [phone](G8988): At least 1 percent but less than 20 percent impaired, limited or restricted (mod I to supervision) Self Care Discharge Status [phone](G8989): At least 1 percent but less than 20 percent impaired, limited or restricted  (mod I to supervision)  Edwin CapPatricia Nesta Kimple , MS, OTR/L, CLT Pager: [phone]  01/19/2016, 10:28 AM

## 2016-01-19 NOTE — Evaluation (Signed)
Physical Therapy Evaluation and Discharge Patient Details Name: Alexandra Stein MRN: [id number] DOB: 1965-11-26 Today's Date: 01/19/2016   History of Present Illness  Pt is a 50 y/o F who had syncopal episode x2 in car and x1 when standing in bathroom (pt was able to slowly lower herself to the floor).  Normal EEG and MRI.  Workup still underway.  Pt's PMH includes Bipolar 2 disorder, potassium deficiency, chest pain,.  Clinical Impression  Pt admitted with above diagnosis. Pt tolerated high level balance activities w/o instability.  No skilled acute PT needs identified.  Suspect that once etiology of syncopal episodes is addressed pt will quickly transition from supervision>Independent level of mobility.  PT is signing off.     Follow Up Recommendations No PT follow up;Supervision for mobility/OOB    Equipment Recommendations  None recommended by PT    Recommendations for Other Services       Precautions / Restrictions Precautions Precautions: Fall Precaution Comments: syncope Restrictions Weight Bearing Restrictions: No      Mobility  Bed Mobility Overal bed mobility: Independent             General bed mobility comments: No cues or physical assist needed  Transfers Overall transfer level: Needs assistance Equipment used: None Transfers: Sit to/from Stand Sit to Stand: Supervision         General transfer comment: Supervision for safety due to pt's c/o headache and dizziness  Ambulation/Gait Ambulation/Gait assistance: Supervision Ambulation Distance (Feet): 200 Feet Assistive device: None Gait Pattern/deviations: Step-through pattern   Gait velocity interpretation: Below normal speed for age/gender General Gait Details: Pt c/o dizziness (BP 103/69) while ambulating.  Supervision provided for safety.  Pt able to tolerate high level balance activities as documented below.  Stairs            Wheelchair Mobility    Modified Rankin (Stroke Patients  Only)       Balance Overall balance assessment: Needs assistance Sitting-balance support: Feet supported;No upper extremity supported Sitting balance-Leahy Scale: Normal     Standing balance support: No upper extremity supported;During functional activity Standing balance-Leahy Scale: Good               High level balance activites: Backward walking;Direction changes;Turns;Sudden stops;Head turns;Other (comment) (stepping over object) High Level Balance Comments: No instability noted w/ high level balance activities listed above             Pertinent Vitals/Pain Pain Assessment: 0-10 Pain Score: 2  Pain Location: headache Pain Descriptors / Indicators: Headache Pain Intervention(s): Limited activity within patient's tolerance;Monitored during session    Home Living Family/patient expects to be discharged to:: Private residence Living Arrangements: Spouse/significant other;Children Available Help at Discharge: Family;Available PRN/intermittently (husband and two sons work/go to school during day) Type of Home: House Home Access: Level entry     Home Layout: One level Home Equipment: None      Prior Function Level of Independence: Independent         Comments: 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     Higher education careers adviserHand Dominance        Extremity/Trunk Assessment   Upper Extremity Assessment: Defer to OT evaluation           Lower Extremity Assessment: Generalized weakness         Communication   Communication: No difficulties  Cognition Arousal/Alertness: Awake/alert Behavior During Therapy: WFL for tasks assessed/performed Overall Cognitive Status: Within Functional Limits for tasks assessed  General Comments General comments (skin integrity, edema, etc.): BP while ambulating: 103/69, BP sitting at end of session 121/77    Exercises Other Exercises Other Exercises: Pt encouraged to ambulate in hallway w/  nursing staff      Assessment/Plan    PT Assessment Patent does not need any further PT services  PT Diagnosis Other (comment);Generalized weakness (syncopal episodes)   PT Problem List    PT Treatment Interventions     PT Goals (Current goals can be found in the Care Plan section) Acute Rehab PT Goals Patient 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 PT Goal Formulation: All assessment and education complete, DC therapy    Frequency     Barriers to discharge        Co-evaluation PT/OT/SLP Co-Evaluation/Treatment: Yes Reason for Co-Treatment: For patient/therapist safety PT goals addressed during session: Mobility/safety with mobility;Balance         End of Session Equipment Utilized During Treatment: Gait belt Activity Tolerance: Patient tolerated treatment well Patient left: in chair;with call bell/phone within reach;with chair alarm set Nurse Communication: Mobility status;Other (comment) (BP)    Functional Assessment Tool Used: Clinical Judgement Functional Limitation: Mobility: Walking and moving around Mobility: Walking and Moving Around Current Status [phone]): At least 1 percent but less than 20 percent impaired, limited or restricted Mobility: Walking and Moving Around Goal Status [phone]): At least 1 percent but less than 20 percent impaired, limited or restricted Mobility: Walking and Moving Around Discharge Status [phone]): At least 1 percent but less than 20 percent impaired, limited or restricted    Time: 0930-0955 PT Time Calculation (min) (ACUTE ONLY): 25 min   Charges:   PT Evaluation $PT Eval Low Complexity: 1 Procedure     PT G Codes:   PT G-Codes **NOT FOR INPATIENT CLASS** Functional Assessment Tool Used: Clinical Judgement Functional Limitation: Mobility: Walking and moving around Mobility: Walking and Moving Around Current Status ([id number]): At least 1 percent but less than 20 percent impaired, limited or restricted Mobility: Walking and Moving  Around Goal Status [phone]): At least 1 percent but less than 20 percent impaired, limited or restricted Mobility: Walking and Moving Around Discharge Status [phone]): At least 1 percent but less than 20 percent impaired, limited or restricted    Encarnacion Chu PT, DPT  Pager: [phone] Phone: [phone] 01/19/2016, 10:12 AM

## 2016-01-19 NOTE — Progress Notes (Signed)
  Echocardiogram 2D Echocardiogram has been performed.  Leta JunglingCooper, Reiley Keisler M 01/19/2016, 2:24 PM

## 2016-02-08 ENCOUNTER — Ambulatory Visit: Payer: BLUE CROSS/BLUE SHIELD | Admitting: Cardiovascular Disease

## 2016-02-10 ENCOUNTER — Ambulatory Visit: Payer: BLUE CROSS/BLUE SHIELD | Admitting: Neurology

## 2016-02-11 ENCOUNTER — Encounter: Payer: Self-pay | Admitting: Cardiovascular Disease

## 2016-02-11 ENCOUNTER — Ambulatory Visit (INDEPENDENT_AMBULATORY_CARE_PROVIDER_SITE_OTHER): Payer: BLUE CROSS/BLUE SHIELD | Admitting: Cardiovascular Disease

## 2016-02-11 VITALS — BP 108/62 | HR 68 | Ht 62.0 in | Wt 256.0 lb

## 2016-02-11 DIAGNOSIS — F488 Other specified nonpsychotic mental disorders: Secondary | ICD-10-CM
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
## 2016-02-11 NOTE — Patient Instructions (Signed)
Medication Instructions:  Your physician recommends that you continue on your current medications as directed. Please refer to the Current Medication list given to you today.  Labwork: none  Testing/Procedures: none  Follow-Up: As needed   If you need a refill on your cardiac medications before your next appointment, please call your pharmacy.  

## 2016-03-03 ENCOUNTER — Ambulatory Visit (INDEPENDENT_AMBULATORY_CARE_PROVIDER_SITE_OTHER): Payer: BLUE CROSS/BLUE SHIELD | Admitting: Neurology

## 2016-03-03 ENCOUNTER — Encounter: Payer: Self-pay | Admitting: Neurology

## 2016-03-03 ENCOUNTER — Other Ambulatory Visit (INDEPENDENT_AMBULATORY_CARE_PROVIDER_SITE_OTHER): Payer: BLUE CROSS/BLUE SHIELD

## 2016-03-03 VITALS — BP 104/68 | HR 93 | Ht 62.0 in | Wt 262.0 lb

## 2016-03-03 DIAGNOSIS — R413 Other amnesia: Secondary | ICD-10-CM

## 2016-03-03 LAB — VITAMIN B12: Vitamin B-12: 279 pg/mL (ref 211–911)

## 2016-03-03 LAB — TSH: TSH: 2.26 u[IU]/mL (ref 0.35–4.50)
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
## 2016-03-03 NOTE — Progress Notes (Signed)
Chart forwarded.  

## 2016-03-03 NOTE — Patient Instructions (Signed)
Talking with you today, and reviewing performance on the cognitive testing, I don't think you have any cognitive impairment (such as early dementia).  I do think it is related to all the stressors you are experiencing.  I will check B12 and TSH, as abnormalities in B12 and thyroid may contribute to memory problems and they are easy to treat  I would monitor for now.  If symptoms get worse, come back to see me for re-evaluation

## 2016-03-08 ENCOUNTER — Telehealth: Payer: Self-pay

## 2016-03-08 NOTE — Telephone Encounter (Signed)
-----   Message from Drema DallasAdam R Jaffe, DO sent at 03/08/2016  2:27 PM EDT ----- TSH is normal.  B12 is in the low end of normal, so she may want to start B12 1000mcg pills daily.

## 2016-03-08 NOTE — Telephone Encounter (Signed)
Message relayed to patient. Verbalized understanding and denied questions.   

## 2016-12-24 ENCOUNTER — Ambulatory Visit (HOSPITAL_COMMUNITY)
Admission: EM | Admit: 2016-12-24 | Discharge: 2016-12-24 | Disposition: A | Discharge status: Home / Self Care | Payer: BLUE CROSS/BLUE SHIELD | Attending: Family Medicine | Admitting: Family Medicine

## 2016-12-24 ENCOUNTER — Encounter (HOSPITAL_COMMUNITY): Payer: Self-pay | Admitting: Family Medicine

## 2016-12-24 DIAGNOSIS — R69 Illness, unspecified: Secondary | ICD-10-CM

## 2016-12-24 DIAGNOSIS — J111 Influenza due to unidentified influenza virus with other respiratory manifestations: Secondary | ICD-10-CM

## 2016-12-24 MED ORDER — OSELTAMIVIR PHOSPHATE 75 MG PO CAPS: 75.0000 mg | ORAL_CAPSULE | Freq: Two times a day (BID) | ORAL | 0 refills | Status: AC

## 2016-12-24 NOTE — ED Provider Notes (Signed)
CSN: [id number]     Arrival date & time 12/24/16  1722 History   First MD Initiated Contact with Patient 12/24/16 1849     Chief Complaint  Patient presents with  . Cough  . Fever   (Consider location/radiation/quality/duration/timing/severity/associated sxs/prior Treatment) Patient is a 51 y.o. Female, reports that she felt fine going to bed last night and suddenly work up at 3 am this morning feeling cold, having chills and was shivering. Her husband thought that she felt really warm last night. She doesn't have thermometer at home to check temp. Soon after, she starts to get congestion, fatigue, coughing, and extreme body aches from waist down.    Fever  Associated symptoms: chills, congestion, cough and myalgias   Associated symptoms: no chest pain, no headaches, no nausea, no rhinorrhea, no sore throat and no vomiting     Past Medical History:  Diagnosis Date  . Bipolar 2 disorder ([facility])   . GERD (gastroesophageal reflux disease)   . H/O chest pain    dx'd with GERD  . History of syncope 2002   Vasovagal  . Hyperlipidemia   . Migraines   . Potassium (K) deficiency    per pt K+ deficiency - ? from meds she takes ( none of which are diuretucs)  . SVD (spontaneous vaginal delivery)    x 2   Past Surgical History:  Procedure Laterality Date  . CESAREAN SECTION     x 1  . CHOLECYSTECTOMY  07/27/2012   Procedure: LAPAROSCOPIC CHOLECYSTECTOMY;  Surgeon: Shelly Rubensteinouglas A Blackman, MD;  Location: [facility];  Service: General;  Laterality: N/A;  . TUBAL LIGATION     x 2  . WISDOM TOOTH EXTRACTION     Family History  Problem Relation Age of Onset  . Dilated cardiomyopathy Mother     with ICD  . Heart disease Mother   . Atrial fibrillation Mother   . Lung cancer Maternal Grandfather     black lung  . Cancer Maternal Grandfather   . Diabetes type II Father   . Cancer Father   . Kidney failure Father   . Dementia Maternal Grandmother   . Cancer Maternal Grandmother   . Stroke  Maternal Grandmother   . Breast cancer    . Healthy Brother   . Healthy Child     x3   Social History  Substance Use Topics  . Smoking status: Never Smoker  . Smokeless tobacco: Never Used  . Alcohol use No   OB History    No data available     Review of Systems  Constitutional: Positive for chills, fatigue and fever.  HENT: Positive for congestion and sneezing. Negative for rhinorrhea, sinus pain, sinus pressure and sore throat.   Respiratory: Positive for cough. Negative for shortness of breath.   Cardiovascular: Negative for chest pain and palpitations.  Gastrointestinal: Negative for abdominal pain, nausea and vomiting.  Musculoskeletal: Positive for myalgias.  Neurological: Negative for dizziness and headaches.    Allergies  Codeine  Home Medications   Prior to Admission medications   Medication Sig Start Date End Date Taking? Authorizing Provider  ARIPiprazole (ABILIFY) 10 MG tablet Take 10 mg by mouth at bedtime.    Historical Provider, MD  DULoxetine (CYMBALTA) 60 MG capsule Take 60 mg by mouth at bedtime.     Historical Provider, MD  OLANZapine (ZYPREXA) 5 MG tablet Take 5 mg by mouth at bedtime.    Historical Provider, MD  oseltamivir (TAMIFLU) 75 MG capsule Take  1 capsule (75 mg total) by mouth every 12 (twelve) hours. 12/24/16 12/29/16  Lucia Estelle, NP  topiramate (TOPAMAX) 100 MG tablet Take 100 mg by mouth at bedtime.     Historical Provider, MD   Meds Ordered and Administered this Visit  Medications - No data to display  BP 111/79   Pulse 102   Temp 98.9 F (37.2 C)   Resp 18   LMP 08/18/2012   SpO2 95%  No data found.   Physical Exam  Constitutional: She is oriented to person, place, and time. She appears well-developed and well-nourished.  HENT:  Head: Normocephalic and atraumatic.  Right Ear: External ear normal.  Left Ear: External ear normal.  Nose: Nose normal.  Mouth/Throat: Oropharynx is clear and moist. No oropharyngeal exudate.  TM  normal bilaterally.   Eyes: Conjunctivae are normal. Pupils are equal, round, and reactive to light.  Neck: Normal range of motion. Neck supple.  Cardiovascular: Normal rate, regular rhythm and normal heart sounds.   No murmur heard. Pulmonary/Chest: Effort normal and breath sounds normal. No respiratory distress. She has no wheezes.  Abdominal: Soft. Bowel sounds are normal. She exhibits no distension. There is no tenderness.  Neurological: She is alert and oriented to person, place, and time.  Skin: Skin is warm and dry.  Psychiatric: She has a normal mood and affect.  Nursing note and vitals reviewed.   Urgent Care Course     Procedures (including critical care time)  Labs Review Labs Reviewed - No data to display  Imaging Review No results found.  MDM   1. Influenza-like illness    1) Discussed Tamiflu; patient would like to give it a try 2) A lot of rest and hydration 3) Start OTC delsym or Robitussin. Other OTC treatment also discussed.  4) work note given for Monday  5) F/u with PCP if no improvement is noted despite treatment.    Lucia Estelle, NP 12/24/16 1900

## 2016-12-24 NOTE — ED Triage Notes (Signed)
Pt here for cough, fever, body aches that started this am. Took tylenol at 4 pm.

## 2017-08-02 ENCOUNTER — Other Ambulatory Visit: Payer: Self-pay | Admitting: Physician Assistant

## 2017-08-02 DIAGNOSIS — M5410 Radiculopathy, site unspecified: Secondary | ICD-10-CM

## 2017-09-09 ENCOUNTER — Emergency Department (HOSPITAL_COMMUNITY)
Admission: EM | Admit: 2017-09-09 | Discharge: 2017-09-09 | Disposition: A | Discharge status: Home / Self Care | Payer: BLUE CROSS/BLUE SHIELD | Attending: Emergency Medicine | Admitting: Emergency Medicine

## 2017-09-09 ENCOUNTER — Encounter (HOSPITAL_COMMUNITY): Payer: Self-pay | Admitting: Emergency Medicine

## 2017-09-09 DIAGNOSIS — R531 Weakness: Secondary | ICD-10-CM | POA: Diagnosis not present

## 2017-09-09 DIAGNOSIS — Z79899 Other long term (current) drug therapy: Secondary | ICD-10-CM | POA: Insufficient documentation

## 2017-09-09 DIAGNOSIS — M79601 Pain in right arm: Secondary | ICD-10-CM | POA: Diagnosis present

## 2017-09-09 MED ORDER — NAPROXEN 500 MG PO TABS: 500.0000 mg | ORAL_TABLET | Freq: Two times a day (BID) | ORAL | 0 refills | Status: DC

## 2017-09-09 NOTE — Discharge Instructions (Signed)
I have provided you with information to follow-up with local rheumatologists.  Please call these offices to schedule an appointment for follow-up of your ongoing body pains and also right arm pain.  Please use 500 mg Aleve twice a day.  This medicine will help with inflammation and also can help with pain.  Return to the emergency department if you develop fever greater than 100.4 Fahrenheit, numbness in which you cannot feel your fingers or arm, have increased swelling/redness of the arm.  Please also return for any new or worsening symptoms.

## 2017-09-09 NOTE — ED Provider Notes (Signed)
[facility] EMERGENCY DEPARTMENT Provider Note   CSN: [id number] Arrival date & time: 09/09/17  1034     History   Chief Complaint Chief Complaint  Patient presents with  . Arm Pain    HPI Alexandra Stein is a 51 y.o. female.  HPI  Alexandra Stein is a 51 year old female with a history of bipolar 2 disorder, migraines, GERD, hyperlipidemia presents to the emergency department for evaluation of right arm pain.  Patient states that she has had total body pains including her legs and arms for the past several months now.  She has been evaluated by her primary care doctor, who has referred her to rheumatology.  Patient states that the rheumatology office cannot see her until March.  Today she states that she has had worsening right arm pain for the past week and a half now.  No inciting injury.  She states that the pain is located throughout her entire right arm including the upper arm, forearm, palms and fingers.  States that the pain feels "sharp and shooting" is constant and an 8/10 in severity.  It is worsened with any type of movement of the arm or fingers.  She has tried taking Aleve for her symptoms without significant improvement.  She has also tried tramadol which she has for migraines without significant relief.  States that she feels like the right arm is weak because of the pain.  Denies fevers, rash, neck pain, numbness, swelling.  Past Medical History:  Diagnosis Date  . Bipolar 2 disorder (HCC)   . GERD (gastroesophageal reflux disease)   . H/O chest pain    dx'd with GERD  . History of syncope 2002   Vasovagal  . Hyperlipidemia   . Migraines   . Potassium (K) deficiency    per pt K+ deficiency - ? from meds she takes ( none of which are diuretucs)  . SVD (spontaneous vaginal delivery)    x 2    Patient Active Problem List   Diagnosis Date Noted  . Depression 01/18/2016  . Syncope 01/18/2016  . Hyperlipidemia   . GERD (gastroesophageal reflux  disease)   . "walking corpse" syndrome   . Alteration of consciousness   . Symptomatic cholelithiasis 07/26/2012  . Bipolar disorder (HCC) 07/26/2012  . Chest pain 12/12/2011  . Hypotension 12/12/2011    Past Surgical History:  Procedure Laterality Date  . CESAREAN SECTION     x 1  . CHOLECYSTECTOMY  07/27/2012   Procedure: LAPAROSCOPIC CHOLECYSTECTOMY;  Surgeon: Shelly Rubenstein, MD;  Location: [facility];  Service: General;  Laterality: N/A;  . TUBAL LIGATION     x 2  . WISDOM TOOTH EXTRACTION      OB History    No data available       Home Medications    Prior to Admission medications   Medication Sig Start Date End Date Taking? Authorizing Provider  ARIPiprazole (ABILIFY) 10 MG tablet Take 10 mg by mouth at bedtime.    [provider]  DULoxetine (CYMBALTA) 60 MG capsule Take 60 mg by mouth at bedtime.     [provider]  OLANZapine (ZYPREXA) 5 MG tablet Take 5 mg by mouth at bedtime.    [provider]  topiramate (TOPAMAX) 100 MG tablet Take 100 mg by mouth at bedtime.     [provider]    Family History Family History  Problem Relation Age of Onset  . Dilated cardiomyopathy Mother  with ICD  . Heart disease Mother   . Atrial fibrillation Mother   . Lung cancer Maternal Grandfather        black lung  . Cancer Maternal Grandfather   . Diabetes type II Father   . Cancer Father   . Kidney failure Father   . Dementia Maternal Grandmother   . Cancer Maternal Grandmother   . Stroke Maternal Grandmother   . Breast cancer Unknown   . Healthy Brother   . Healthy Child        x3    Social History Social History  Substance Use Topics  . Smoking status: Never Smoker  . Smokeless tobacco: Never Used  . Alcohol use No     Allergies   Codeine   Review of Systems Review of Systems  Constitutional: Negative for chills, fatigue and fever.  Respiratory: Negative for shortness of breath.   Cardiovascular: Negative  for chest pain.  Musculoskeletal: Positive for myalgias. Negative for back pain, gait problem, joint swelling (right arm pain), neck pain and neck stiffness.  Neurological: Positive for weakness. Negative for speech difficulty, numbness and headaches.     Physical Exam Updated Vital Signs BP (!) 104/59 (BP Location: Left Arm)   Pulse 93   Temp 98.8 F (37.1 C) (Oral)   Resp 17   Ht 5\' 2"  (1.575 m)   Wt 111.1 kg (245 lb)   LMP 08/18/2012   SpO2 100%   BMI 44.81 kg/m   Physical Exam  Constitutional: She is oriented to person, place, and time. She appears well-developed and well-nourished. No distress.  HENT:  Head: Normocephalic and atraumatic.  Eyes: Right eye exhibits no discharge. Left eye exhibits no discharge.  Neck: Normal range of motion. Neck supple.  No cervical spinal tenderness  Cardiovascular: Normal rate, regular rhythm and intact distal pulses.  Exam reveals no friction rub.   No murmur heard. Pulmonary/Chest: Effort normal. No respiratory distress. She has no wheezes. She has no rales.  Musculoskeletal:  No deformity, bruising, erythema, swelling, rash, warmth noted on the right upper extremity. Right arm grossly tender to palpation over the fingers, palms, forearms, upper arm.  No shoulder or neck pain.  Full ROM of shoulder, elbow, wrist and fingers of the right upper extremity although painful.  5/5 strength in right upper extremity.  Radial pulses 2+ bilaterally.  Lymphadenopathy:    She has no cervical adenopathy.  Neurological: She is alert and oriented to person, place, and time. Coordination normal.  Biceps reflex 2+ bilaterally.  Distal sensation to light touch intact in bilateral upper extremities.  Skin: Skin is warm and dry. Capillary refill takes less than 2 seconds. She is not diaphoretic.  Psychiatric: She has a normal mood and affect. Her behavior is normal.  Nursing note and vitals reviewed.    ED Treatments / Results  Labs (all labs ordered  are listed, but only abnormal results are displayed) Labs Reviewed - No data to display  EKG  EKG Interpretation None       Radiology No results found.  Procedures Procedures (including critical care time)  Medications Ordered in ED Medications - No data to display   Initial Impression / Assessment and Plan / ED Course  I have reviewed the triage vital signs and the nursing notes.  Pertinent labs & imaging results that were available during my care of the patient were reviewed by me and considered in my medical decision making (see chart for details).    Patient  presents with right arm pain which has been worsening for the past week and a half.  No inciting injury.  No neurological deficits on exam to suggest stroke.  No erythema/edema/rash to suggest infection.  She has a history of total body aches for several months now, suspect that this is a rheumatological problem given the distribution of pain.  I have given her information to follow-up with local rheumatologist.  Discussed strict return precautions the patient voices understanding.   Final Clinical Impressions(s) / ED Diagnoses   Final diagnoses:  Right arm pain    New Prescriptions New Prescriptions   No medications on file     Lawrence Marseilles 09/09/17 1714    Benjiman Core, MD 09/10/17 [phone]

## 2017-09-09 NOTE — ED Notes (Signed)
Declined W/C at D/C and was escorted to lobby by RN. 

## 2017-09-09 NOTE — ED Triage Notes (Signed)
Pt. Stated, I've had right arm pain for 3 days and this morning it woke me up. Im unable to use it at all. Pt. Has = grips.

## 2017-09-18 DIAGNOSIS — F900 Attention-deficit hyperactivity disorder, predominantly inattentive type: Secondary | ICD-10-CM | POA: Insufficient documentation

## 2017-09-18 NOTE — Progress Notes (Deleted)
Office Visit Note  Patient: Alexandra Stein             Date of Birth: 1966/08/04           MRN: [id number]             PCP: Elias Elseeade, Robert, MD Referring: Karlyne GreenspanScifres, Dorothy, PA-C Visit Date: 10/02/2017 Occupation: @GUAROCC @    Subjective:  No chief complaint on file.   History of Present Illness: Alexandra FilesVictoria L Brogan is a 51 y.o. female ***   Activities of Daily Living:  Patient reports morning stiffness for *** {minute/hour:19697}.   Patient {ACTIONS;DENIES/REPORTS:21021675::"Denies"} nocturnal pain.  Difficulty dressing/grooming: {ACTIONS;DENIES/REPORTS:21021675::"Denies"} Difficulty climbing stairs: {ACTIONS;DENIES/REPORTS:21021675::"Denies"} Difficulty getting out of chair: {ACTIONS;DENIES/REPORTS:21021675::"Denies"} Difficulty using hands for taps, buttons, cutlery, and/or writing: {ACTIONS;DENIES/REPORTS:21021675::"Denies"}   No Rheumatology ROS completed.   PMFS History:  Patient Active Problem List   Diagnosis Date Noted  . Depression 01/18/2016  . Syncope 01/18/2016  . Hyperlipidemia   . GERD (gastroesophageal reflux disease)   . "walking corpse" syndrome   . Alteration of consciousness   . Symptomatic cholelithiasis 07/26/2012  . Bipolar disorder (HCC) 07/26/2012  . Chest pain 12/12/2011  . Hypotension 12/12/2011    Past Medical History:  Diagnosis Date  . Bipolar 2 disorder (HCC)   . GERD (gastroesophageal reflux disease)   . H/O chest pain    dx'd with GERD  . History of syncope 2002   Vasovagal  . Hyperlipidemia   . Migraines   . Potassium (K) deficiency    per pt K+ deficiency - ? from meds she takes ( none of which are diuretucs)  . SVD (spontaneous vaginal delivery)    x 2    Family History  Problem Relation Age of Onset  . Dilated cardiomyopathy Mother        with ICD  . Heart disease Mother   . Atrial fibrillation Mother   . Lung cancer Maternal Grandfather        black lung  . Cancer Maternal Grandfather   . Diabetes type II Father     . Cancer Father   . Kidney failure Father   . Dementia Maternal Grandmother   . Cancer Maternal Grandmother   . Stroke Maternal Grandmother   . Breast cancer Unknown   . Healthy Brother   . Healthy Child        x3   Past Surgical History:  Procedure Laterality Date  . CESAREAN SECTION     x 1  . TUBAL LIGATION     x 2  . WISDOM TOOTH EXTRACTION     Social History   Social History Narrative   [place]ives in [place], [place] with [place].      Objective: Vital Signs: LMP 08/18/2012    Physical Exam   Musculoskeletal Exam: ***  CDAI Exam: No CDAI exam completed.    Investigation: No additional findings.   Imaging: No results found.  Speciality Comments: No specialty comments available.    Procedures:  No procedures performed Allergies: Codeine   Assessment / Plan:     Visit Diagnoses: No diagnosis found.    Orders: No orders of the defined types were placed in this encounter.  No orders of the defined types were placed in this encounter.   Face-to-face time spent with patient was *** minutes. 50% of time was spent in counseling and coordination of care.  Follow-Up Instructions: No Follow-up on file.   Pollyann SavoyShaili Rohn Fritsch, MD  Note - This record has been created using  Dragon software.  Chart creation errors have been sought, but may not always  have been located. Such creation errors do not reflect on  the standard of medical care. 

## 2017-10-02 ENCOUNTER — Ambulatory Visit: Payer: Self-pay | Admitting: Rheumatology

## 2018-01-10 ENCOUNTER — Ambulatory Visit: Payer: Self-pay | Admitting: Rheumatology

## 2019-05-22 ENCOUNTER — Emergency Department (HOSPITAL_COMMUNITY): Payer: BC Managed Care – PPO

## 2019-05-22 ENCOUNTER — Observation Stay (HOSPITAL_COMMUNITY)
Admission: EM | Admit: 2019-05-22 | Discharge: 2019-05-23 | Disposition: A | Discharge status: Home / Self Care | Payer: BC Managed Care – PPO | Attending: Internal Medicine | Admitting: Internal Medicine

## 2019-05-22 ENCOUNTER — Encounter (HOSPITAL_COMMUNITY): Payer: Self-pay | Admitting: Emergency Medicine

## 2019-05-22 DIAGNOSIS — I208 Other forms of angina pectoris: Secondary | ICD-10-CM

## 2019-05-22 DIAGNOSIS — K219 Gastro-esophageal reflux disease without esophagitis: Secondary | ICD-10-CM | POA: Insufficient documentation

## 2019-05-22 DIAGNOSIS — R072 Precordial pain: Secondary | ICD-10-CM | POA: Diagnosis not present

## 2019-05-22 DIAGNOSIS — Z20828 Contact with and (suspected) exposure to other viral communicable diseases: Secondary | ICD-10-CM | POA: Diagnosis not present

## 2019-05-22 DIAGNOSIS — F319 Bipolar disorder, unspecified: Secondary | ICD-10-CM | POA: Diagnosis present

## 2019-05-22 DIAGNOSIS — E785 Hyperlipidemia, unspecified: Secondary | ICD-10-CM | POA: Diagnosis not present

## 2019-05-22 DIAGNOSIS — F3181 Bipolar II disorder: Secondary | ICD-10-CM | POA: Diagnosis not present

## 2019-05-22 DIAGNOSIS — F32A Depression, unspecified: Secondary | ICD-10-CM | POA: Diagnosis present

## 2019-05-22 DIAGNOSIS — R0789 Other chest pain: Secondary | ICD-10-CM | POA: Diagnosis present

## 2019-05-22 DIAGNOSIS — F329 Major depressive disorder, single episode, unspecified: Secondary | ICD-10-CM | POA: Diagnosis not present

## 2019-05-22 DIAGNOSIS — F908 Attention-deficit hyperactivity disorder, other type: Secondary | ICD-10-CM | POA: Diagnosis not present

## 2019-05-22 DIAGNOSIS — F900 Attention-deficit hyperactivity disorder, predominantly inattentive type: Secondary | ICD-10-CM | POA: Diagnosis present

## 2019-05-22 DIAGNOSIS — R079 Chest pain, unspecified: Secondary | ICD-10-CM | POA: Diagnosis present

## 2019-05-22 LAB — CBC
HCT: 43.4 % (ref 36.0–46.0)
HCT: 44.3 % (ref 36.0–46.0)
Hemoglobin: 13.5 g/dL (ref 12.0–15.0)
Hemoglobin: 13.7 g/dL (ref 12.0–15.0)
MCH: 29.3 pg (ref 26.0–34.0)
MCH: 29.8 pg (ref 26.0–34.0)
MCHC: 31.1 g/dL (ref 30.0–36.0)
MCHC: 30.9 g/dL (ref 30.0–36.0)
MCV: 94.3 fL (ref 80.0–100.0)
MCV: 96.3 fL (ref 80.0–100.0)
Platelets: 234 10*3/uL (ref 150–400)
Platelets: 236 10*3/uL (ref 150–400)
RBC: 4.6 MIL/uL (ref 3.87–5.11)
RBC: 4.6 MIL/uL (ref 3.87–5.11)
RDW: 13.3 % (ref 11.5–15.5)
RDW: 13.3 % (ref 11.5–15.5)
WBC: 6.3 10*3/uL (ref 4.0–10.5)
WBC: 7 10*3/uL (ref 4.0–10.5)
nRBC: 0 % (ref 0.0–0.2)
nRBC: 0 % (ref 0.0–0.2)

## 2019-05-22 LAB — TROPONIN I (HIGH SENSITIVITY)
Troponin I (High Sensitivity): <2 ng/L (ref <18)
Troponin I (High Sensitivity): 3 ng/L (ref <18)
Troponin I (High Sensitivity): <2 ng/L (ref <18)
Troponin I (High Sensitivity): <2 ng/L (ref <18)

## 2019-05-22 LAB — CREATININE, SERUM
Creatinine, Ser: 0.83 mg/dL (ref 0.44–1.00)
GFR calc Af Amer: >60 mL/min (ref >60)
GFR calc non Af Amer: >60 mL/min (ref >60)

## 2019-05-22 LAB — BASIC METABOLIC PANEL
Anion gap: 10 (ref 5–15)
BUN: 12 mg/dL (ref 6–20)
CO2: 20 mmol/L — ABNORMAL LOW (ref 22–32)
Calcium: 9.4 mg/dL (ref 8.9–10.3)
Chloride: 111 mmol/L (ref 98–111)
Creatinine, Ser: 0.84 mg/dL (ref 0.44–1.00)
GFR calc Af Amer: >60 mL/min (ref >60)
GFR calc non Af Amer: >60 mL/min (ref >60)
Glucose, Bld: 103 mg/dL — ABNORMAL HIGH (ref 70–99)
Potassium: 4.1 mmol/L (ref 3.5–5.1)
Sodium: 141 mmol/L (ref 135–145)

## 2019-05-22 LAB — I-STAT BETA HCG BLOOD, ED (MC, WL, AP ONLY): I-stat hCG, quantitative: <5 m[IU]/mL (ref <5)

## 2019-05-22 LAB — SARS CORONAVIRUS 2 BY RT PCR (HOSPITAL ORDER, PERFORMED IN ~~LOC~~ HOSPITAL LAB): SARS Coronavirus 2: NEGATIVE

## 2019-05-22 MED ORDER — NITROGLYCERIN 0.4 MG SL SUBL
0.4000 mg | SUBLINGUAL_TABLET | SUBLINGUAL | Status: DC | PRN
  Administered 2019-05-22: 0.4 mg via SUBLINGUAL
  Filled 2019-05-22: qty 1

## 2019-05-22 MED ORDER — ONDANSETRON HCL 4 MG/2ML IJ SOLN: 4.0000 mg | Freq: Four times a day (QID) | INTRAMUSCULAR | Status: DC | PRN

## 2019-05-22 MED ORDER — ZOLPIDEM TARTRATE 5 MG PO TABS: 5.0000 mg | ORAL_TABLET | Freq: Every evening | ORAL | Status: DC | PRN

## 2019-05-22 MED ORDER — TOPIRAMATE 100 MG PO TABS
100.0000 mg | ORAL_TABLET | Freq: Every day | ORAL | Status: DC
  Administered 2019-05-22: 100 mg via ORAL
  Filled 2019-05-22: qty 1

## 2019-05-22 MED ORDER — PANTOPRAZOLE SODIUM 40 MG PO TBEC
40.0000 mg | DELAYED_RELEASE_TABLET | Freq: Every day | ORAL | Status: DC
  Administered 2019-05-22 – 2019-05-23 (×2): 40 mg via ORAL
  Filled 2019-05-22 (×2): qty 1

## 2019-05-22 MED ORDER — FUROSEMIDE 20 MG PO TABS
20.0000 mg | ORAL_TABLET | Freq: Every day | ORAL | Status: DC
  Administered 2019-05-23: 09:00:00 20 mg via ORAL
  Filled 2019-05-22: qty 1

## 2019-05-22 MED ORDER — SODIUM CHLORIDE 0.45 % IV SOLN
INTRAVENOUS | Status: DC
  Administered 2019-05-22: 16:00:00 via INTRAVENOUS

## 2019-05-22 MED ORDER — ENOXAPARIN SODIUM 40 MG/0.4ML ~~LOC~~ SOLN
40.0000 mg | SUBCUTANEOUS | Status: DC
  Administered 2019-05-22: 40 mg via SUBCUTANEOUS
  Filled 2019-05-22: qty 0.4

## 2019-05-22 MED ORDER — OLANZAPINE 5 MG PO TABS
5.0000 mg | ORAL_TABLET | Freq: Every day | ORAL | Status: DC
  Administered 2019-05-22: 5 mg via ORAL
  Filled 2019-05-22: qty 1

## 2019-05-22 MED ORDER — NAPROXEN 250 MG PO TABS: 500.0000 mg | ORAL_TABLET | Freq: Two times a day (BID) | ORAL | Status: DC | PRN

## 2019-05-22 MED ORDER — ASPIRIN EC 81 MG PO TBEC
81.0000 mg | DELAYED_RELEASE_TABLET | Freq: Every day | ORAL | Status: DC
  Administered 2019-05-23: 81 mg via ORAL
  Filled 2019-05-22: qty 1

## 2019-05-22 MED ORDER — ARIPIPRAZOLE 10 MG PO TABS
10.0000 mg | ORAL_TABLET | Freq: Every day | ORAL | Status: DC
  Administered 2019-05-22: 10 mg via ORAL
  Filled 2019-05-22: qty 2
  Filled 2019-05-22: qty 1

## 2019-05-22 MED ORDER — ACETAMINOPHEN 325 MG PO TABS: 650.0000 mg | ORAL_TABLET | ORAL | Status: DC | PRN

## 2019-05-22 MED ORDER — DULOXETINE HCL 60 MG PO CPEP
60.0000 mg | ORAL_CAPSULE | Freq: Every day | ORAL | Status: DC
  Administered 2019-05-22: 60 mg via ORAL
  Filled 2019-05-22: qty 1

## 2019-05-22 MED ORDER — SODIUM CHLORIDE 0.9% FLUSH
3.0000 mL | Freq: Once | INTRAVENOUS | Status: AC
  Administered 2019-05-22: 3 mL via INTRAVENOUS

## 2019-05-22 NOTE — ED Notes (Signed)
ED TO INPATIENT HANDOFF REPORT  ED Nurse Name and Phone #: [name] [phone]   S Name/Age/Gender Alexandra Stein 53 y.o. female Room/Bed: RESUSC/RESUSC  Code Status   Code Status: Full Code  Home/SNF/Other Home Patient oriented to: self, place, time and situation Is this baseline? Yes   Triage Complete: Triage complete  Chief Complaint chest pain  Triage Note Pt here from work with c/o chest pain ands h/a ,radiating to the left shoulder , no n/v but did have some  Sob but has got better , pt received 324mg  Jonne PlyAsa from EMS    Allergies Allergies  Allergen Reactions  . Codeine Other (See Comments)    Hallucinations and fever. Pt CAN take Vicodin and Percocet.    Level of Care/Admitting Diagnosis ED Disposition    ED Disposition Condition Comment   Admit  [facility] [[id number]]  Level of Care: Telemetry Medical [104]  I expect the patient will be discharged within 24 hours: Yes  LOW acuity---Tx typically complete <24 hrs---ACUTE conditions typically can be evaluated <24 hours---LABS likely to return to acceptable levels <24 hours---IS near functional baseline---EXPECTED to return to current living arrangement---NOT newly hypoxic: Meets criteria for 5C-Observation unit  Covid Evaluation: Confirmed COVID Negative  Diagnosis: Chest pain at rest [102725][341128]  Admitting Physician: Jacques NavyNORINS, MICHAEL E [[id number]]  Attending Physician: Illene RegulusNORINS, MICHAEL E [[id number]]  PT Class (Do Not Modify): Observation [104]  PT Acc Code (Do Not Modify): Observation [10022]       B Medical/Surgery History Past Medical History:  Diagnosis Date  . Bipolar 2 disorder (HCC)   . GERD (gastroesophageal reflux disease)   . H/O chest pain    dx'd with GERD  . History of syncope 2002   Vasovagal  . Hyperlipidemia   . Migraines   . Potassium (K) deficiency    per pt K+ deficiency - ? from meds she takes ( none of which are diuretucs)  . SVD (spontaneous vaginal delivery)    x 2    Past Surgical History:  Procedure Laterality Date  . CESAREAN SECTION     x 1  . CHOLECYSTECTOMY  07/27/2012   Procedure: LAPAROSCOPIC CHOLECYSTECTOMY;  Surgeon: Shelly Rubensteinouglas A Blackman, MD;  Location: [facility];  Service: General;  Laterality: N/A;  . TUBAL LIGATION     x 2  . WISDOM TOOTH EXTRACTION       A IV Location/Drains/Wounds Patient Lines/Drains/Airways Status   Active Line/Drains/Airways    Name:   Placement date:   Placement time:   Site:   Days:   Peripheral IV 05/22/19 Left Forearm   05/22/19    1007    Forearm   less than 1   Incision 07/27/12 Abdomen Other (Comment)   07/27/12    1349     2490   Incision 08/27/12 Perineum Other (Comment)   08/27/12    0705     2459   Incision - 4 Ports Abdomen Umbilicus Superior Superior;Right Right;Lateral   07/27/12    1339     2490          Intake/Output Last 24 hours No intake or output data in the 24 hours ending 05/22/19 1528  Labs/Imaging Results for orders placed or performed during the hospital encounter of 05/22/19 (from the past 48 hour(s))  Basic metabolic panel     Status: Abnormal   Collection Time: 05/22/19 10:13 AM  Result Value Ref Range   Sodium 141 135 - 145 mmol/L  Potassium 4.1 3.5 - 5.1 mmol/L   Chloride 111 98 - 111 mmol/L   CO2 20 (L) 22 - 32 mmol/L   Glucose, Bld 103 (H) 70 - 99 mg/dL   BUN 12 6 - 20 mg/dL   Creatinine, Ser 1.610.84 0.44 - 1.00 mg/dL   Calcium 9.4 8.9 - 09.610.3 mg/dL   GFR calc non Af Amer >60 >60 mL/min   GFR calc Af Amer >60 >60 mL/min   Anion gap 10 5 - 15    Comment: Performed at [facility], [address] [place], [place] [address]  CBC     Status: None   Collection Time: 05/22/19 10:13 AM  Result Value Ref Range   WBC 6.3 4.0 - 10.5 K/uL   RBC 4.60 3.87 - 5.11 MIL/uL   Hemoglobin 13.5 12.0 - 15.0 g/dL   HCT 98.143.4 19.136.0 - 47.846.0 %   MCV 94.3 80.0 - 100.0 fL   MCH 29.3 26.0 - 34.0 pg   MCHC 31.1 30.0 - 36.0 g/dL   RDW 29.513.3 62.111.5 - 30.815.5 %   Platelets 234 150 - 400 K/uL    nRBC 0.0 0.0 - 0.2 %    Comment: Performed at [facility], [address] [place], [place] [address]  Troponin I (High Sensitivity)     Status: None   Collection Time: 05/22/19 10:13 AM  Result Value Ref Range   Troponin I (High Sensitivity) <2 <18 ng/L    Comment: Performed at [facility], [address] [place], [place] [address]  I-Stat beta hCG blood, ED     Status: None   Collection Time: 05/22/19 10:17 AM  Result Value Ref Range   I-stat hCG, quantitative <5.0 <5 mIU/mL   Comment 3            Comment:   GEST. AGE      CONC.  (mIU/mL)   <=1 WEEK        5 - 50     2 WEEKS       50 - 500     3 WEEKS       100 - 10,000     4 WEEKS     1,000 - 30,000        FEMALE AND NON-PREGNANT FEMALE:     LESS THAN 5 mIU/mL   Troponin I (High Sensitivity)     Status: None   Collection Time: 05/22/19 12:13 PM  Result Value Ref Range   Troponin I (High Sensitivity) 3 <18 ng/L    Comment: (NOTE) Elevated high sensitivity troponin I (hsTnI) values and significant  changes across serial measurements may suggest ACS but many other  chronic and acute conditions are known to elevate hsTnI results.  Refer to the "Links" section for chest pain algorithms and additional  guidance. Performed at [facility], [address] [place], [place] [address]   SARS Coronavirus 2 (CEPHEID - Performed in [facility] hospital lab), Hosp Order     Status: None   Collection Time: 05/22/19 12:35 PM   Specimen: Nasopharyngeal Swab  Result Value Ref Range   SARS Coronavirus 2 NEGATIVE NEGATIVE    Comment: (NOTE) If result is NEGATIVE SARS-CoV-2 target nucleic acids are NOT DETECTED. The SARS-CoV-2 RNA is generally detectable in upper and lower  respiratory specimens during the acute phase of infection. The lowest  concentration of SARS-CoV-2 viral copies this assay can detect is 250  copies / mL. A  negative result does not preclude SARS-CoV-2 infection  and should not be used as the sole  basis for treatment or other  patient management decisions.  A negative result may occur with  improper specimen collection / handling, submission of specimen other  than nasopharyngeal swab, presence of viral mutation(s) within the  areas targeted by this assay, and inadequate number of viral copies  (<250 copies / mL). A negative result must be combined with clinical  observations, patient history, and epidemiological information. If result is POSITIVE SARS-CoV-2 target nucleic acids are DETECTED. The SARS-CoV-2 RNA is generally detectable in upper and lower  respiratory specimens dur ing the acute phase of infection.  Positive  results are indicative of active infection with SARS-CoV-2.  Clinical  correlation with patient history and other diagnostic information is  necessary to determine patient infection status.  Positive results do  not rule out bacterial infection or co-infection with other viruses. If result is PRESUMPTIVE POSTIVE SARS-CoV-2 nucleic acids MAY BE PRESENT.   A presumptive positive result was obtained on the submitted specimen  and confirmed on repeat testing.  While 2019 novel coronavirus  (SARS-CoV-2) nucleic acids may be present in the submitted sample  additional confirmatory testing may be necessary for epidemiological  and / or clinical management purposes  to differentiate between  SARS-CoV-2 and other Sarbecovirus currently known to infect humans.  If clinically indicated additional testing with an alternate test  methodology [phone]([id number]) is advised. The SARS-CoV-2 RNA is generally  detectable in upper and lower respiratory sp ecimens during the acute  phase of infection. The expected result is Negative. Fact Sheet for Patients:  BoilerBrush.com.cyhttps://www.fda.gov/media/136312/download Fact Sheet for Healthcare Providers: https://pope.com/https://www.fda.gov/media/136313/download This test is not yet approved or cleared by the [place] FDA and has been authorized for detection  and/or diagnosis of SARS-CoV-2 by FDA under an Emergency Use Authorization (EUA).  This EUA will remain in effect (meaning this test can be used) for the duration of the COVID-19 declaration under Section 564(b)(1) of the Act, 21 U.S.C. section 360bbb-3(b)(1), unless the authorization is terminated or revoked sooner. Performed at [facility], [address] [place], [place] [address]   CBC     Status: None   Collection Time: 05/22/19  2:29 PM  Result Value Ref Range   WBC 7.0 4.0 - 10.5 K/uL   RBC 4.60 3.87 - 5.11 MIL/uL   Hemoglobin 13.7 12.0 - 15.0 g/dL   HCT 95.644.3 21.336.0 - 08.646.0 %   MCV 96.3 80.0 - 100.0 fL   MCH 29.8 26.0 - 34.0 pg   MCHC 30.9 30.0 - 36.0 g/dL   RDW 57.813.3 46.911.5 - 62.915.5 %   Platelets 236 150 - 400 K/uL   nRBC 0.0 0.0 - 0.2 %    Comment: Performed at [facility], [address] [place], [place] [address]   Dg Chest 2 View  Result Date: 05/22/2019 CLINICAL DATA:  Chest pain EXAM: CHEST - 2 VIEW COMPARISON:  11/05/2015 FINDINGS: The heart size and mediastinal contours are within normal limits. Both lungs are clear. The visualized skeletal structures are unremarkable. IMPRESSION: No acute abnormality of the lungs. Electronically Signed   By: Lauralyn PrimesAlex  Bibbey M.D.   On: 05/22/2019 11:05    Pending Labs Unresulted Labs (From admission, onward)    Start     Ordered   05/29/19 0500  Creatinine, serum  (enoxaparin (LOVENOX)    CrCl >/= 30 ml/min)  Weekly,   R    Comments:  while on enoxaparin therapy    05/22/19 1434   05/22/19 1430  Creatinine, serum  (enoxaparin (LOVENOX)    CrCl >/= 30 ml/min)  Once,   STAT    Comments: Baseline for enoxaparin therapy IF NOT ALREADY DRAWN.    05/22/19 1434   05/22/19 1429  HIV antibody (Routine Testing)  Once,   STAT     05/22/19 1434          Vitals/Pain Today's Vitals   05/22/19 1251 05/22/19 1315 05/22/19 1445 05/22/19 1520  BP:  129/70 118/72   Pulse:  74 71   Resp:  16 11   Temp:      SpO2:  99% 97%    PainSc: 2    0-No pain    Isolation Precautions No active isolations  Medications Medications  nitroGLYCERIN (NITROSTAT) SL tablet 0.4 mg (0.4 mg Sublingual Given 05/22/19 1156)  naproxen (NAPROSYN) tablet 500 mg (has no administration in time range)  furosemide (LASIX) tablet 20 mg (has no administration in time range)  ARIPiprazole (ABILIFY) tablet 10 mg (has no administration in time range)  DULoxetine (CYMBALTA) DR capsule 60 mg (has no administration in time range)  OLANZapine (ZYPREXA) tablet 5 mg (has no administration in time range)  topiramate (TOPAMAX) tablet 100 mg (has no administration in time range)  acetaminophen (TYLENOL) tablet 650 mg (has no administration in time range)  ondansetron (ZOFRAN) injection 4 mg (has no administration in time range)  enoxaparin (LOVENOX) injection 40 mg (has no administration in time range)  0.45 % sodium chloride infusion (has no administration in time range)  aspirin EC tablet 81 mg (has no administration in time range)  zolpidem (AMBIEN) tablet 5 mg (has no administration in time range)  pantoprazole (PROTONIX) EC tablet 40 mg (has no administration in time range)  sodium chloride flush (NS) 0.9 % injection 3 mL (3 mLs Intravenous Given 05/22/19 1021)    Mobility walks Low fall risk   Focused Assessments Cardiac Assessment Handoff:  Cardiac Rhythm: Normal sinus rhythm Lab Results  Component Value Date   CKTOTAL 549 (H) 12/16/2011   CKMB 1.7 12/13/2011   TROPONINI <0.03 01/18/2016   Lab Results  Component Value Date   DDIMER 0.59 (H) 02/08/2012   Does the Patient currently have chest pain? No     R Recommendations: See Admitting Provider Note  Report given to:   Additional Notes:  CP free at this time, neg troponins, ambulatory, independent

## 2019-05-22 NOTE — H&P (Signed)
History and Physical    Alexandra Stein NWG:[id number] DOB: 13-Dec-1965 DOA: 05/22/2019  PCP: Elias Elseeade, Robert, MD   Patient coming from: Patient is coming from work    Chief Complaint: Chest pain with radiation to her left arm  HPI: Alexandra Stein is a 53 y.o. female with medical history significant of depression and GERD.  Patient reports after reporting for work this morning she had an episode of chest pressure in her left outer chest with radiation to her left arm.  She had accompanying shortness of breath and felt flushed.  He rates the symptoms as severe.  Symptoms improved and she had a recurrent episode of significant chest pressure in the left chest with radiation again to her arm and shortness of breath and felt flushed.  At this point she became quite concerned, notified her supervisor, and [facility].  She denies any previous episodes of chest pain.  She does have a positive family history for heart disease in her mother  ED Course: In the emergency department patient had an EKG that showed no acute changes but did have QT prolongation.  Initial high-sensitivity troponin was normal range.  It was COVID negative.  She seems stable without recurrent chest pain or discomfort.  He was referred to [facility] for admission for rule out MI the setting of at rest chest pain  Review of Systems: As per HPI otherwise 10 point review of systems negative.  Specifically denies any respiratory difficulty, no prior history of chest pain until today, no GI symptoms although she has a history of GERD.  Patient reports that she has had multiple falls over the last several weeks.  These are of sudden onset with no prodrome.  Not have a loss of consciousness.  Has had unprotected falls.  She denies any dizziness other episodes of disequilibrium, headache or other neurologic symptoms   Past Medical History:  Diagnosis Date  . Bipolar 2 disorder (HCC)   .  GERD (gastroesophageal reflux disease)   . H/O chest pain    dx'd with GERD  . History of syncope 2002   Vasovagal  . Hyperlipidemia   . Migraines   . Potassium (K) deficiency    per pt K+ deficiency - ? from meds she takes ( none of which are diuretucs)  . SVD (spontaneous vaginal delivery)    x 2    Past Surgical History:  Procedure Laterality Date  . CESAREAN SECTION     x 1  . CHOLECYSTECTOMY  07/27/2012   Procedure: LAPAROSCOPIC CHOLECYSTECTOMY;  Surgeon: Shelly Rubensteinouglas A Blackman, MD;  Location: [facility];  Service: General;  Laterality: N/A;  . TUBAL LIGATION     x 2  . WISDOM TOOTH EXTRACTION       reports that she has never smoked. She has never used smokeless tobacco. She reports that she does not drink alcohol or use drugs.  Allergies  Allergen Reactions  . Codeine Other (See Comments)    Hallucinations and fever. Pt CAN take Vicodin and Percocet.    Family History  Problem Relation Age of Onset  . Dilated cardiomyopathy Mother        with ICD  . Heart disease Mother   . Atrial fibrillation Mother   . Lung cancer Maternal Grandfather        black lung  . Cancer Maternal Grandfather   . Diabetes type II Father   . Cancer Father   . Kidney failure Father   .  Dementia Maternal Grandmother   . Cancer Maternal Grandmother   . Stroke Maternal Grandmother   . Breast cancer Other   . Healthy Brother   . Healthy Child        x3   Social history -patient's been married for 30 years.  Has 3 children and 4 grandchildren.  Lives with her husband.  They have 3 dogs.  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  Prior to Admission medications   Medication Sig Start Date End Date Taking? Authorizing Provider  ARIPiprazole (ABILIFY) 10 MG tablet Take 10 mg by mouth at bedtime.   Yes [provider]  DULoxetine (CYMBALTA) 60 MG capsule Take 60 mg by mouth at bedtime.    Yes [provider]  furosemide (LASIX) 20 MG tablet Take 20 mg by mouth daily.   Yes [provider]  naproxen (NAPROSYN) 500 MG tablet Take 1 tablet (500 mg total) by mouth 2 (two) times daily. Patient taking differently: Take 500 mg by mouth 2 (two) times daily as needed for mild pain.  09/09/17  Yes Kellie ShropshireShrosbree, Emily J, PA-C  OLANZapine (ZYPREXA) 5 MG tablet Take 5 mg by mouth at bedtime.   Yes [provider]  topiramate (TOPAMAX) 100 MG tablet Take 100 mg by mouth at bedtime.    Yes [provider]    Physical Exam: Vitals:   05/22/19 1100 05/22/19 1145 05/22/19 1230 05/22/19 1315  BP: (!) 143/70 135/78 119/83 129/70  Pulse: 72 (!) 58 71 74  Resp: 15 17 18 16   Temp:      SpO2: 99% 98% 99% 99%    Constitutional: NAD, calm, comfortable Vitals:   05/22/19 1100 05/22/19 1145 05/22/19 1230 05/22/19 1315  BP: (!) 143/70 135/78 119/83 129/70  Pulse: 72 (!) 58 71 74  Resp: 15 17 18 16   Temp:      SpO2: 99% 98% 99% 99%   General appearance overweight woman in no acute distress.  She is cooperative and a good historian  eyes: PERRL, lids and conjunctivae normal ENMT: Mucous membranes are moist. Posterior pharynx clear of any exudate or lesions.Normal dentition.  Neck: normal, supple, no masses, no thyromegaly Respiratory: clear to auscultation bilaterally, no wheezing, no crackles. Normal respiratory effort. No accessory muscle use.  Cardiovascular: Regular rate and rhythm, no murmurs / rubs / gallops. No extremity edema. 2+ pedal pulses. No carotid bruits.  Abdomen: Obese, no tenderness, no masses palpated. No hepatosplenomegaly. Bowel sounds positive.  Musculoskeletal: no clubbing / cyanosis. No joint deformity upper and lower extremities.  Limited ROM both shoulders can [place] to pain when getting above a 90 degree extension, no contractures. Normal muscle tone.  Skin: no rashes, lesions, ulcers. No induration Neurologic: CN 2-12 grossly intact. Sensation intact Strength 5/5 in all 4.  Psychiatric: Normal judgment and insight. Alert and oriented x 3.  Normal mood.     Labs on Admission: I have personally reviewed following labs and imaging studies  CBC: Recent Labs  Lab 05/22/19 1013  WBC 6.3  HGB 13.5  HCT 43.4  MCV 94.3  PLT 234   Basic Metabolic Panel: Recent Labs  Lab 05/22/19 1013  NA 141  K 4.1  CL 111  CO2 20*  GLUCOSE 103*  BUN 12  CREATININE 0.84  CALCIUM 9.4   GFR: CrCl cannot be calculated (Unknown ideal weight.). Liver Function Tests: No results for input(s): AST, ALT, ALKPHOS, BILITOT, PROT, ALBUMIN in the last 168 hours. No results for input(s): LIPASE, AMYLASE in  the last 168 hours. No results for input(s): AMMONIA in the last 168 hours. Coagulation Profile: No results for input(s): INR, PROTIME in the last 168 hours. Cardiac Enzymes: No results for input(s): CKTOTAL, CKMB, CKMBINDEX, TROPONINI in the last 168 hours. BNP (last 3 results) No results for input(s): PROBNP in the last 8760 hours. HbA1C: No results for input(s): HGBA1C in the last 72 hours. CBG: No results for input(s): GLUCAP in the last 168 hours. Lipid Profile: No results for input(s): CHOL, HDL, LDLCALC, TRIG, CHOLHDL, LDLDIRECT in the last 72 hours. Thyroid Function Tests: No results for input(s): TSH, T4TOTAL, FREET4, T3FREE, THYROIDAB in the last 72 hours. Anemia Panel: No results for input(s): VITAMINB12, FOLATE, FERRITIN, TIBC, IRON, RETICCTPCT in the last 72 hours. Urine analysis:    Component Value Date/Time   COLORURINE YELLOW 01/17/2016 2045   APPEARANCEUR HAZY (A) 01/17/2016 2045   LABSPEC 1.018 01/17/2016 2045   PHURINE 6.5 01/17/2016 2045   GLUCOSEU NEGATIVE 01/17/2016 2045   HGBUR NEGATIVE 01/17/2016 2045   BILIRUBINUR NEGATIVE 01/17/2016 2045   KETONESUR NEGATIVE 01/17/2016 2045   PROTEINUR NEGATIVE 01/17/2016 2045   UROBILINOGEN 1.0 07/26/2012 1350   NITRITE NEGATIVE 01/17/2016 2045   LEUKOCYTESUR NEGATIVE 01/17/2016 2045    Radiological Exams on Admission: Dg Chest 2 View  Result Date: 05/22/2019  CLINICAL DATA:  Chest pain EXAM: CHEST - 2 VIEW COMPARISON:  11/05/2015 FINDINGS: The heart size and mediastinal contours are within normal limits. Both lungs are clear. The visualized skeletal structures are unremarkable. IMPRESSION: No acute abnormality of the lungs. Electronically Signed   By: Eddie Candle M.D.   On: 05/22/2019 11:05    EKG: Independently reviewed.  Normal sinus rhythm, no acute changes.  Mild prolongation of QT interval.  Assessment/Plan Active Problems:   Chest pain   Bipolar disorder (HCC)   Hyperlipidemia   GERD (gastroesophageal reflux disease)   Depression   Attention deficit hyperactivity disorder (ADHD), predominantly inattentive type   Chest pain at rest  1.  Chest pain -patient with typical chest pain in the left chest described as a pressure discomfort with radiation to her left arm associated with shortness of breath and a feeling of flushing.  This did occur at rest.  Risk factors include obesity, sedentary lifestyle, history of hyperlipidemia although on no medication and a positive family history with her mother having had a heart attack at age 63.  Patient does not smoke or drink. Plan admit to telemetry observation  Cycle cardiac enzymes  If any abnormalities with cardiac enzymes or on repeat EKG would consider a nuclear stress study   prior to discharge.  All studies are normal will defer to her primary care physician, Dr. Mariea Clonts, for further work-up and evaluation.  2.  Psychiatric -patient with history of depression.  She is currently emotionally stable.  She does adhere to her medication regimen. Plan continue home medications  3.  GERD patient admits to history of GERD but denies any current symptoms.  Reports that her chest discomfort is unlike previous GERD pain Plan Protonix 40 mg p.o. daily  4.  Falls patient gives a history of having multiple falls with no prodrome.  She does not protect herself when she falls describing it as a "face plant."   Her neurologic exam on admission is unremarkable. Plan patient is advised to discuss this with her primary care physician develop a plan for further evaluation  DVT prophylaxis: Lovenox (Lovenox/Heparin/SCD's/anticoagulated/None (if comfort care) Code Status: Full code (Full/Partial (specify details)  Family Communication: Patient declined my offer to communicate with her husband.  (Specify name, relationship. Do not write "discussed with patient". Specify tel # if discussed over the phone) Disposition Plan: Home in 24 hours.  (specify when and where you expect patient to be discharged) Consults called: None (with names) Admission status: Observation to medical telemetry (inpatient / obs / tele / medical floor / SDU)   Illene RegulusMichael Monika Chestang MD [facility] Pager [phone]  If 7PM-7AM, please contact night-coverage [email or web address] Password [facility]  05/22/2019, 2:35 PM

## 2019-05-22 NOTE — ED Notes (Signed)
ED Provider at bedside. 

## 2019-05-22 NOTE — ED Notes (Signed)
Report given to Annie RN 

## 2019-05-22 NOTE — ED Notes (Signed)
Pt being transported to x-ray

## 2019-05-22 NOTE — ED Provider Notes (Signed)
Emergency Department Provider Note   I have reviewed the triage vital signs and the nursing notes.   HISTORY  Chief Complaint Chest Pain   HPI Alexandra Stein is a 53 y.o. female with PMH of HLD, GERD, and Bipolar disorder presents to the emergency department for evaluation of acute onset left-sided chest pain radiating to the arm and neck.  Patient reports feeling very flushed and warm but denies nausea or vomiting.  No diaphoresis.  Symptoms began at approximately 9 AM.  Patient was at work and experienced several minutes of discomfort.  She alerted her coworker who called 911.  She states the pain temporarily decreased but then returned shortly afterwards.  No clear modifying factors.  Patient without similar chest pain in the past.  She initially describes it as a squeezing but states that since has felt more like pressure/heaviness in the chest.  EMS gave full dose aspirin in route.    Past Medical History:  Diagnosis Date  . Bipolar 2 disorder ([place])   . GERD (gastroesophageal reflux disease)   . H/O chest pain    dx'd with GERD  . History of syncope 2002   Vasovagal  . Hyperlipidemia   . Migraines   . Potassium (K) deficiency    per pt K+ deficiency - ? from meds she takes ( none of which are diuretucs)  . SVD (spontaneous vaginal delivery)    x 2    Patient Active Problem List   Diagnosis Date Noted  . Chest pain at rest 05/22/2019  . Attention deficit hyperactivity disorder (ADHD), predominantly inattentive type 09/18/2017  . Depression 01/18/2016  . Syncope 01/18/2016  . Hyperlipidemia   . GERD (gastroesophageal reflux disease)   . "walking corpse" syndrome   . Alteration of consciousness   . Symptomatic cholelithiasis 07/26/2012  . Bipolar disorder ([place]) 07/26/2012  . Chest pain 12/12/2011  . Hypotension 12/12/2011    Past Surgical History:  Procedure Laterality Date  . CESAREAN SECTION     x 1  . CHOLECYSTECTOMY  07/27/2012   Procedure: LAPAROSCOPIC  CHOLECYSTECTOMY;  Surgeon: Harl Bowie, MD;  Location: [facility];  Service: General;  Laterality: N/A;  . TUBAL LIGATION     x 2  . WISDOM TOOTH EXTRACTION      Allergies Codeine  Family History  Problem Relation Age of Onset  . Dilated cardiomyopathy Mother        with ICD  . Heart disease Mother   . Atrial fibrillation Mother   . Lung cancer Maternal Grandfather        black lung  . Cancer Maternal Grandfather   . Diabetes type II Father   . Cancer Father   . Kidney failure Father   . Dementia Maternal Grandmother   . Cancer Maternal Grandmother   . Stroke Maternal Grandmother   . Breast cancer Other   . Healthy Brother   . Healthy Child        x3    Social History Social History   Tobacco Use  . Smoking status: Never Smoker  . Smokeless tobacco: Never Used  Substance Use Topics  . Alcohol use: No  . Drug use: No    Review of Systems  Constitutional: No fever/chills Eyes: No visual changes. ENT: No sore throat. Cardiovascular: Positive chest pain. Respiratory: Denies shortness of breath. Gastrointestinal: No abdominal pain.  No nausea, no vomiting.  No diarrhea.  No constipation. Genitourinary: Negative for dysuria. Musculoskeletal: Negative for back pain. Skin:  Negative for rash. Neurological: Negative for headaches, focal weakness or numbness.  10-point ROS otherwise negative.  ____________________________________________   PHYSICAL EXAM:  VITAL SIGNS: ED Triage Vitals  Enc Vitals Group     BP 05/22/19 1009 113/81     Pulse Rate 05/22/19 1009 65     Resp 05/22/19 1009 12     Temp 05/22/19 1009 (!) 97.5 F (36.4 C)     Temp src --      SpO2 05/22/19 1009 98 %     Pain Score 05/22/19 1004 0   Constitutional: Alert and oriented. Well appearing and in no acute distress. Eyes: Conjunctivae are normal.  Head: Atraumatic. Nose: No congestion/rhinnorhea. Mouth/Throat: Mucous membranes are moist.  Neck: No stridor.   Cardiovascular: Normal  rate, regular rhythm. Good peripheral circulation. Grossly normal heart sounds.   Respiratory: Normal respiratory effort.  No retractions. Lungs CTAB. Gastrointestinal: Soft and nontender. No distention.  Musculoskeletal: No lower extremity tenderness nor edema. No gross deformities of extremities. Neurologic:  Normal speech and language.  Skin:  Skin is warm, dry and intact. No rash noted.  ____________________________________________   LABS (all labs ordered are listed, but only abnormal results are displayed)  Labs Reviewed  BASIC METABOLIC PANEL - Abnormal; Notable for the following components:      Result Value   CO2 20 (*)    Glucose, Bld 103 (*)    All other components within normal limits  SARS CORONAVIRUS 2 (HOSPITAL ORDER, PERFORMED IN [facility])  CBC  HIV ANTIBODY (ROUTINE TESTING W REFLEX)  CBC  CREATININE, SERUM  I-STAT BETA HCG BLOOD, ED ([facility], [facility], [facility] ONLY)  TROPONIN I (HIGH SENSITIVITY)  TROPONIN I (HIGH SENSITIVITY)  TROPONIN I (HIGH SENSITIVITY)  TROPONIN I (HIGH SENSITIVITY)   ____________________________________________  EKG   EKG Interpretation  Date/Time:  Wednesday May 22 2019 10:09:28 EDT Ventricular Rate:  68 PR Interval:    QRS Duration: 86 QT Interval:  684 QTC Calculation: 728 R Axis:   11 Text Interpretation:  Age not entered, assumed to be  53 years old for purpose of ECG interpretation Sinus rhythm Borderline repolarization abnormality Prolonged QT interval No STEMI  Confirmed by Alona BeneLong, Margarita Bobrowski [phone]([id number]) on 05/22/2019 10:53:01 AM       ____________________________________________  RADIOLOGY  Dg Chest 2 View  Result Date: 05/22/2019 CLINICAL DATA:  Chest pain EXAM: CHEST - 2 VIEW COMPARISON:  11/05/2015 FINDINGS: The heart size and mediastinal contours are within normal limits. Both lungs are clear. The visualized skeletal structures are unremarkable. IMPRESSION: No acute abnormality of the lungs. Electronically Signed   By:  Lauralyn PrimesAlex  Bibbey M.D.   On: 05/22/2019 11:05    ____________________________________________   PROCEDURES  Procedure(s) performed:   Procedures  None  ____________________________________________   INITIAL IMPRESSION / ASSESSMENT AND PLAN / ED COURSE  Pertinent labs & imaging results that were available during my care of the patient were reviewed by me and considered in my medical decision making (see chart for details).   Patient presents to the emergency department for evaluation of acute onset chest discomfort.  Story of chest discomfort is concerning. HEART score is 5.  Low risk for PE with low suspicion for this diagnosis overall.  Vital signs reviewed and unremarkable.  EKG with nonspecific ST changes but no STEMI.  Patient having some residual chest heaviness.  Plan for nitroglycerin.  Has already received full dose aspirin by EMS.   12:30 PM  Patient's chest pain/pressure completely resolved after  nitroglycerin.  Currently chest pain-free.  Her troponin is less than 2.  Chest x-ray and other lab work is normal.  Given moderate risk by HEART score and concerning story for ACS plan for obs admit for further risk stratification.   Discussed patient's case with Hospitalist to request admission. Patient and family (if present) updated with plan. Care transferred to Hospitalist service.  I reviewed all nursing notes, vitals, pertinent old records, EKGs, labs, imaging (as available).  ____________________________________________  FINAL CLINICAL IMPRESSION(S) / ED DIAGNOSES  Final diagnoses:  Precordial chest pain     MEDICATIONS GIVEN DURING THIS VISIT:  Medications  nitroGLYCERIN (NITROSTAT) SL tablet 0.4 mg (0.4 mg Sublingual Given 05/22/19 1156)  naproxen (NAPROSYN) tablet 500 mg (has no administration in time range)  furosemide (LASIX) tablet 20 mg (has no administration in time range)  ARIPiprazole (ABILIFY) tablet 10 mg (10 mg Oral Given 05/22/19 2123)  DULoxetine  (CYMBALTA) DR capsule 60 mg (60 mg Oral Given 05/22/19 2123)  OLANZapine (ZYPREXA) tablet 5 mg (5 mg Oral Given 05/22/19 2124)  topiramate (TOPAMAX) tablet 100 mg (100 mg Oral Given 05/22/19 2124)  acetaminophen (TYLENOL) tablet 650 mg (has no administration in time range)  ondansetron (ZOFRAN) injection 4 mg (has no administration in time range)  enoxaparin (LOVENOX) injection 40 mg (40 mg Subcutaneous Given 05/22/19 1615)  0.45 % sodium chloride infusion ( Intravenous Rate/Dose Verify 05/23/19 0300)  aspirin EC tablet 81 mg (81 mg Oral Not Given 05/22/19 1604)  zolpidem (AMBIEN) tablet 5 mg (has no administration in time range)  pantoprazole (PROTONIX) EC tablet 40 mg (40 mg Oral Given 05/22/19 1614)  sodium chloride flush (NS) 0.9 % injection 3 mL (3 mLs Intravenous Given 05/22/19 1021)    Note:  This document was prepared using Dragon voice recognition software and may include unintentional dictation errors.  Alona BeneJoshua Sheza Strickland, MD Emergency Medicine    Erven Ramson, Arlyss RepressJoshua G, MD 05/23/19 [phone]

## 2019-05-22 NOTE — ED Triage Notes (Addendum)
Pt here from work with c/o chest pain ands h/a ,radiating to the left shoulder , no n/v but did have some  Sob but has got better , pt received 324mg  Diona Fanti from EMS

## 2019-05-23 DIAGNOSIS — F908 Attention-deficit hyperactivity disorder, other type: Secondary | ICD-10-CM | POA: Diagnosis not present

## 2019-05-23 DIAGNOSIS — R072 Precordial pain: Secondary | ICD-10-CM | POA: Diagnosis not present

## 2019-05-23 DIAGNOSIS — E785 Hyperlipidemia, unspecified: Secondary | ICD-10-CM | POA: Diagnosis not present

## 2019-05-23 DIAGNOSIS — K219 Gastro-esophageal reflux disease without esophagitis: Secondary | ICD-10-CM | POA: Diagnosis not present

## 2019-05-23 LAB — HIV ANTIBODY (ROUTINE TESTING W REFLEX): HIV Screen 4th Generation wRfx: NONREACTIVE

## 2019-05-23 MED ORDER — ASPIRIN 81 MG PO TBEC: 81.0000 mg | DELAYED_RELEASE_TABLET | Freq: Every day | ORAL | Status: DC

## 2019-05-23 NOTE — Plan of Care (Signed)
  Problem: Clinical Measurements: Goal: Will remain free from infection Outcome: Progressing Goal: Cardiovascular complication will be avoided Outcome: Progressing   Problem: Coping: Goal: Level of anxiety will decrease Outcome: Progressing   Problem: Safety: Goal: Ability to remain free from injury will improve Outcome: Progressing   

## 2019-05-23 NOTE — Discharge Summary (Signed)
Physician Discharge Summary  Alexandra FilesVictoria L Stein WUJ:[id number] DOB: 17-Sep-1966 DOA: 05/22/2019  PCP: Elias Elseeade, Robert, MD  Admit date: 05/22/2019 Discharge date: 05/23/2019  Admitted From: home  Disposition:  Home   Recommendations for Outpatient Follow-up:  1. Follow up with PCP in 1-2 weeks 2. Follow up with stress test , will be scheduled by Cardiology service   Home Health:NA  Equipment/Devices:NA   Discharge Condition: stable   CODE STATUS:full code  Diet recommendation: regular diet   Discharge Summary: 53 year old female with history significant for depression and GERD.  She came to the ER after having 2 episodes of left lateral chest pain that radiated to her left arm.  Also had some shortness of breath.  Patient was admitted for monitoring in the hospital.  Initial troponin and follow-up troponins were nonischemic.  Initial EKG and follow-up EKG was nonischemic.  She has remained asymptomatic since admission. Prolonged QTC mentioned by automatic machine on admission was at her.  She has normal QTC.  Patient monitored overnight.  No symptoms.  Currently stable.  Probably atypical chest pain.  Patient is going home, she will be ordered an outpatient stress test with [facility] cardiology.  Discharge Diagnoses:  Active Problems:   Chest pain   Bipolar disorder (HCC)   Hyperlipidemia   GERD (gastroesophageal reflux disease)   Depression   Attention deficit hyperactivity disorder (ADHD), predominantly inattentive type   Chest pain at rest    Discharge Instructions  Discharge Instructions    Call MD for:   Complete by: As directed    Recurrent pain   Diet - low sodium heart healthy   Complete by: As directed    Discharge instructions   Complete by: As directed    Will schedule you for a stress test, office will call you  take baby aspirin daily.   Increase activity slowly   Complete by: As directed      Allergies as of 05/23/2019      Reactions   Codeine Other (See Comments)   Hallucinations and fever. Pt CAN take Vicodin and Percocet.      Medication List    TAKE these medications   ARIPiprazole 10 MG tablet Commonly known as: ABILIFY Take 10 mg by mouth at bedtime.   aspirin 81 MG EC tablet Take 1 tablet (81 mg total) by mouth daily. Start taking on: May 24, 2019   DULoxetine 60 MG capsule Commonly known as: CYMBALTA Take 60 mg by mouth at bedtime.   furosemide 20 MG tablet Commonly known as: LASIX Take 20 mg by mouth daily.   naproxen 500 MG tablet Commonly known as: NAPROSYN Take 1 tablet (500 mg total) by mouth 2 (two) times daily. What changed:   when to take this  reasons to take this   OLANZapine 5 MG tablet Commonly known as: ZYPREXA Take 5 mg by mouth at bedtime.   topiramate 100 MG tablet Commonly known as: TOPAMAX Take 100 mg by mouth at bedtime.       Allergies  Allergen Reactions  . Codeine Other (See Comments)    Hallucinations and fever. Pt CAN take Vicodin and Percocet.    Consultations:  None    Procedures/Studies: Dg Chest 2 View  Result Date: 05/22/2019 CLINICAL DATA:  Chest pain EXAM: CHEST - 2 VIEW COMPARISON:  11/05/2015 FINDINGS: The heart size and mediastinal contours are within normal limits. Both lungs are clear. The visualized skeletal structures are unremarkable. IMPRESSION: No acute abnormality of the lungs.  Electronically Signed   By: Lauralyn PrimesAlex  Stein M.D.   On: 05/22/2019 11:05     Subjective: Patient was seen and examined on the day of discharge.  She had no overnight events.  Results discussed.  She is willing to go home, go back to work and do outpatient stress test.   Discharge Exam: Vitals:   05/23/19 0023 05/23/19 0448  BP: 125/74 129/79  Pulse: 70 64  Resp: 20 16  Temp: 97.9 F (36.6 C) 97.6 F (36.4 C)  SpO2: 96% 97%   Vitals:   05/22/19 1607 05/22/19 2051 05/23/19 0023 05/23/19 0448  BP: 121/74 101/61 125/74 129/79  Pulse:  71 70 64  Resp: 17  16 20 16   Temp: 97.9 F (36.6 C) 97.8 F (36.6 C) 97.9 F (36.6 C) 97.6 F (36.4 C)  TempSrc: Oral Oral Oral Oral  SpO2: 100% 97% 96% 97%  Weight: 119.7 kg   119.8 kg  Height: 5\' 2"  (1.575 m)       General: Pt is alert, awake, not in acute distress Cardiovascular: RRR, S1/S2 +, no rubs, no gallops Respiratory: CTA bilaterally, no wheezing, no rhonchi Abdominal: Soft, NT, ND, bowel sounds + Extremities: no edema, no cyanosis    The results of significant diagnostics from this hospitalization (including imaging, microbiology, ancillary and laboratory) are listed below for reference.     Microbiology: Recent Results (from the past 240 hour(s))  SARS Coronavirus 2 (CEPHEID - Performed in [facility] hospital lab), Hosp Order     Status: None   Collection Time: 05/22/19 12:35 PM   Specimen: Nasopharyngeal Swab  Result Value Ref Range Status   SARS Coronavirus 2 NEGATIVE NEGATIVE Final    Comment: (NOTE) If result is NEGATIVE SARS-CoV-2 target nucleic acids are NOT DETECTED. The SARS-CoV-2 RNA is generally detectable in upper and lower  respiratory specimens during the acute phase of infection. The lowest  concentration of SARS-CoV-2 viral copies this assay can detect is 250  copies / mL. A negative result does not preclude SARS-CoV-2 infection  and should not be used as the sole basis for treatment or other  patient management decisions.  A negative result may occur with  improper specimen collection / handling, submission of specimen other  than nasopharyngeal swab, presence of viral mutation(s) within the  areas targeted by this assay, and inadequate number of viral copies  (<250 copies / mL). A negative result must be combined with clinical  observations, patient history, and epidemiological information. If result is POSITIVE SARS-CoV-2 target nucleic acids are DETECTED. The SARS-CoV-2 RNA is generally detectable in upper and lower  respiratory specimens dur ing the  acute phase of infection.  Positive  results are indicative of active infection with SARS-CoV-2.  Clinical  correlation with patient history and other diagnostic information is  necessary to determine patient infection status.  Positive results do  not rule out bacterial infection or co-infection with other viruses. If result is PRESUMPTIVE POSTIVE SARS-CoV-2 nucleic acids MAY BE PRESENT.   A presumptive positive result was obtained on the submitted specimen  and confirmed on repeat testing.  While 2019 novel coronavirus  (SARS-CoV-2) nucleic acids may be present in the submitted sample  additional confirmatory testing may be necessary for epidemiological  and / or clinical management purposes  to differentiate between  SARS-CoV-2 and other Sarbecovirus currently known to infect humans.  If clinically indicated additional testing with an alternate test  methodology [phone]([id number]) is advised. The SARS-CoV-2 RNA is generally  detectable  in upper and lower respiratory sp ecimens during the acute  phase of infection. The expected result is Negative. Fact Sheet for Patients:  StrictlyIdeas.no Fact Sheet for Healthcare Providers: BankingDealers.co.za This test is not yet approved or cleared by the [place] FDA and has been authorized for detection and/or diagnosis of SARS-CoV-2 by FDA under an Emergency Use Authorization (EUA).  This EUA will remain in effect (meaning this test can be used) for the duration of the COVID-19 declaration under Section 564(b)(1) of the Act, 21 U.S.C. section 360bbb-3(b)(1), unless the authorization is terminated or revoked sooner. Performed at [facility], [place] [address] [place],  [address]      Labs: BNP (last 3 results) No results for input(s): BNP in the last 8760 hours. Basic Metabolic Panel: Recent Labs  Lab 05/22/19 1013 05/22/19 1429  NA 141  --   K 4.1  --   CL 111  --   CO2 20*   --   GLUCOSE 103*  --   BUN 12  --   CREATININE 0.84 0.83  CALCIUM 9.4  --    Liver Function Tests: No results for input(s): AST, ALT, ALKPHOS, BILITOT, PROT, ALBUMIN in the last 168 hours. No results for input(s): LIPASE, AMYLASE in the last 168 hours. No results for input(s): AMMONIA in the last 168 hours. CBC: Recent Labs  Lab 05/22/19 1013 05/22/19 1429  WBC 6.3 7.0  HGB 13.5 13.7  HCT 43.4 44.3  MCV 94.3 96.3  PLT 234 236   Cardiac Enzymes: No results for input(s): CKTOTAL, CKMB, CKMBINDEX, TROPONINI in the last 168 hours. BNP: Invalid input(s): POCBNP CBG: No results for input(s): GLUCAP in the last 168 hours. D-Dimer No results for input(s): DDIMER in the last 72 hours. Hgb A1c No results for input(s): HGBA1C in the last 72 hours. Lipid Profile No results for input(s): CHOL, HDL, LDLCALC, TRIG, CHOLHDL, LDLDIRECT in the last 72 hours. Thyroid function studies No results for input(s): TSH, T4TOTAL, T3FREE, THYROIDAB in the last 72 hours.  Invalid input(s): FREET3 Anemia work up No results for input(s): VITAMINB12, FOLATE, FERRITIN, TIBC, IRON, RETICCTPCT in the last 72 hours. Urinalysis    Component Value Date/Time   COLORURINE YELLOW 01/17/2016 2045   APPEARANCEUR HAZY (A) 01/17/2016 2045   LABSPEC 1.018 01/17/2016 2045   PHURINE 6.5 01/17/2016 2045   GLUCOSEU NEGATIVE 01/17/2016 2045   HGBUR NEGATIVE 01/17/2016 2045   BILIRUBINUR NEGATIVE 01/17/2016 2045   KETONESUR NEGATIVE 01/17/2016 2045   PROTEINUR NEGATIVE 01/17/2016 2045   UROBILINOGEN 1.0 07/26/2012 1350   NITRITE NEGATIVE 01/17/2016 2045   LEUKOCYTESUR NEGATIVE 01/17/2016 2045   Sepsis Labs Invalid input(s): PROCALCITONIN,  WBC,  LACTICIDVEN Microbiology Recent Results (from the past 240 hour(s))  SARS Coronavirus 2 (CEPHEID - Performed in [place] hospital lab), Hosp Order     Status: None   Collection Time: 05/22/19 12:35 PM   Specimen: Nasopharyngeal Swab  Result Value Ref Range  Status   SARS Coronavirus 2 NEGATIVE NEGATIVE Final    Comment: (NOTE) If result is NEGATIVE SARS-CoV-2 target nucleic acids are NOT DETECTED. The SARS-CoV-2 RNA is generally detectable in upper and lower  respiratory specimens during the acute phase of infection. The lowest  concentration of SARS-CoV-2 viral copies this assay can detect is 250  copies / mL. A negative result does not preclude SARS-CoV-2 infection  and should not be used as the sole basis for treatment or other  patient management decisions.  A negative result  may occur with  improper specimen collection / handling, submission of specimen other  than nasopharyngeal swab, presence of viral mutation(s) within the  areas targeted by this assay, and inadequate number of viral copies  (<250 copies / mL). A negative result must be combined with clinical  observations, patient history, and epidemiological information. If result is POSITIVE SARS-CoV-2 target nucleic acids are DETECTED. The SARS-CoV-2 RNA is generally detectable in upper and lower  respiratory specimens dur ing the acute phase of infection.  Positive  results are indicative of active infection with SARS-CoV-2.  Clinical  correlation with patient history and other diagnostic information is  necessary to determine patient infection status.  Positive results do  not rule out bacterial infection or co-infection with other viruses. If result is PRESUMPTIVE POSTIVE SARS-CoV-2 nucleic acids MAY BE PRESENT.   A presumptive positive result was obtained on the submitted specimen  and confirmed on repeat testing.  While 2019 novel coronavirus  (SARS-CoV-2) nucleic acids may be present in the submitted sample  additional confirmatory testing may be necessary for epidemiological  and / or clinical management purposes  to differentiate between  SARS-CoV-2 and other Sarbecovirus currently known to infect humans.  If clinically indicated additional testing with an alternate  test  methodology [phone]([id number]) is advised. The SARS-CoV-2 RNA is generally  detectable in upper and lower respiratory sp ecimens during the acute  phase of infection. The expected result is Negative. Fact Sheet for Patients:  BoilerBrush.com.cyhttps://www.fda.gov/media/136312/download Fact Sheet for Healthcare Providers: https://pope.com/https://www.fda.gov/media/136313/download This test is not yet approved or cleared by the [place] FDA and has been authorized for detection and/or diagnosis of SARS-CoV-2 by FDA under an Emergency Use Authorization (EUA).  This EUA will remain in effect (meaning this test can be used) for the duration of the COVID-19 declaration under Section 564(b)(1) of the Act, 21 U.S.C. section 360bbb-3(b)(1), unless the authorization is terminated or revoked sooner. Performed at [facility], [address] [place], [place] [address]      Time coordinating discharge:25  minutes  SIGNED:   Dorcas CarrowKuber Nevaeh Korte, MD  [facility] 05/23/2019, 1:27 PM

## 2019-06-03 ENCOUNTER — Other Ambulatory Visit (HOSPITAL_COMMUNITY): Payer: Self-pay | Admitting: Family Medicine

## 2019-06-03 DIAGNOSIS — R079 Chest pain, unspecified: Secondary | ICD-10-CM

## 2019-06-10 ENCOUNTER — Ambulatory Visit: Payer: BC Managed Care – PPO | Admitting: Cardiology

## 2019-09-08 ENCOUNTER — Other Ambulatory Visit: Payer: Self-pay

## 2019-09-08 ENCOUNTER — Emergency Department (HOSPITAL_COMMUNITY)
Admission: EM | Admit: 2019-09-08 | Discharge: 2019-09-08 | Disposition: A | Discharge status: Home / Self Care | Payer: BC Managed Care – PPO | Attending: Emergency Medicine | Admitting: Emergency Medicine

## 2019-09-08 ENCOUNTER — Emergency Department (HOSPITAL_COMMUNITY): Payer: BC Managed Care – PPO

## 2019-09-08 ENCOUNTER — Encounter (HOSPITAL_COMMUNITY): Payer: Self-pay | Admitting: Emergency Medicine

## 2019-09-08 DIAGNOSIS — R05 Cough: Secondary | ICD-10-CM | POA: Diagnosis present

## 2019-09-08 DIAGNOSIS — Z79899 Other long term (current) drug therapy: Secondary | ICD-10-CM | POA: Diagnosis not present

## 2019-09-08 DIAGNOSIS — Z7982 Long term (current) use of aspirin: Secondary | ICD-10-CM | POA: Insufficient documentation

## 2019-09-08 DIAGNOSIS — U071 COVID-19: Secondary | ICD-10-CM | POA: Insufficient documentation

## 2019-09-08 DIAGNOSIS — R509 Fever, unspecified: Secondary | ICD-10-CM | POA: Diagnosis not present

## 2019-09-08 DIAGNOSIS — R059 Cough, unspecified: Secondary | ICD-10-CM

## 2019-09-08 DIAGNOSIS — R0602 Shortness of breath: Secondary | ICD-10-CM | POA: Diagnosis not present

## 2019-09-08 LAB — CBC WITH DIFFERENTIAL/PLATELET
Abs Immature Granulocytes: 0.02 10*3/uL (ref 0.00–0.07)
Basophils Absolute: 0 10*3/uL (ref 0.0–0.1)
Basophils Relative: 0 %
Eosinophils Absolute: 0 10*3/uL (ref 0.0–0.5)
Eosinophils Relative: 0 %
HCT: 41 % (ref 36.0–46.0)
Hemoglobin: 13.3 g/dL (ref 12.0–15.0)
Immature Granulocytes: 1 %
Lymphocytes Relative: 22 %
Lymphs Abs: 0.9 10*3/uL (ref 0.7–4.0)
MCH: 29.6 pg (ref 26.0–34.0)
MCHC: 32.4 g/dL (ref 30.0–36.0)
MCV: 91.1 fL (ref 80.0–100.0)
Monocytes Absolute: 0.2 10*3/uL (ref 0.1–1.0)
Monocytes Relative: 5 %
Neutro Abs: 3 10*3/uL (ref 1.7–7.7)
Neutrophils Relative %: 72 %
Platelets: 163 10*3/uL (ref 150–400)
RBC: 4.5 MIL/uL (ref 3.87–5.11)
RDW: 13.1 % (ref 11.5–15.5)
WBC: 4.1 10*3/uL (ref 4.0–10.5)
nRBC: 0 % (ref 0.0–0.2)

## 2019-09-08 LAB — COMPREHENSIVE METABOLIC PANEL
ALT: 67 U/L — ABNORMAL HIGH (ref 0–44)
AST: 76 U/L — ABNORMAL HIGH (ref 15–41)
Albumin: 3.6 g/dL (ref 3.5–5.0)
Alkaline Phosphatase: 140 U/L — ABNORMAL HIGH (ref 38–126)
Anion gap: 10 (ref 5–15)
BUN: 12 mg/dL (ref 6–20)
CO2: 20 mmol/L — ABNORMAL LOW (ref 22–32)
Calcium: 8.8 mg/dL — ABNORMAL LOW (ref 8.9–10.3)
Chloride: 109 mmol/L (ref 98–111)
Creatinine, Ser: 0.89 mg/dL (ref 0.44–1.00)
GFR calc Af Amer: >60 mL/min (ref >60)
GFR calc non Af Amer: >60 mL/min (ref >60)
Glucose, Bld: 103 mg/dL — ABNORMAL HIGH (ref 70–99)
Potassium: 3.9 mmol/L (ref 3.5–5.1)
Sodium: 139 mmol/L (ref 135–145)
Total Bilirubin: 0.8 mg/dL (ref 0.3–1.2)
Total Protein: 6.9 g/dL (ref 6.5–8.1)

## 2019-09-08 LAB — I-STAT BETA HCG BLOOD, ED (MC, WL, AP ONLY): I-stat hCG, quantitative: <5 m[IU]/mL (ref <5)

## 2019-09-08 LAB — LACTIC ACID, PLASMA: Lactic Acid, Venous: 1 mmol/L (ref 0.5–1.9)

## 2019-09-08 MED ORDER — DOXYCYCLINE HYCLATE 100 MG PO CAPS: 100.0000 mg | ORAL_CAPSULE | Freq: Two times a day (BID) | ORAL | 0 refills | Status: DC

## 2019-09-08 MED ORDER — SODIUM CHLORIDE 0.9% FLUSH: 3.0000 mL | Freq: Once | INTRAVENOUS | Status: DC

## 2019-09-08 NOTE — Discharge Instructions (Addendum)
Take Tylenol for fever and follow-up with your doctor later this week.  Your Covid test should be back by Tuesday

## 2019-09-08 NOTE — ED Provider Notes (Signed)
[facility] EMERGENCY DEPARTMENT Provider Note   CSN: [id number] Arrival date & time: 09/08/19  1534     History   Chief Complaint Chief Complaint  Patient presents with  . Shortness of Breath  . Fever  . Cough    HPI Alexandra Stein is a 53 y.o. female.     Patient complains of cough and shortness of breath.  She is also had a fever.  Patient was treated with a Z-Pak for sinus infection but has not improved.  She was sent over here for evaluation by her doctor  The history is provided by the patient. No language interpreter was used.  Shortness of Breath Severity:  Mild Onset quality:  Sudden Timing:  Constant Progression:  Waxing and waning Chronicity:  New Relieved by:  Nothing Worsened by:  Nothing Ineffective treatments:  None tried Associated symptoms: cough and fever   Associated symptoms: no abdominal pain, no chest pain, no headaches and no rash   Fever Associated symptoms: cough   Associated symptoms: no chest pain, no congestion, no diarrhea, no headaches and no rash   Cough Associated symptoms: fever and shortness of breath   Associated symptoms: no chest pain, no eye discharge, no headaches and no rash     Past Medical History:  Diagnosis Date  . Bipolar 2 disorder (HCC)   . GERD (gastroesophageal reflux disease)   . H/O chest pain    dx'd with GERD  . History of syncope 2002   Vasovagal  . Hyperlipidemia   . Migraines   . Potassium (K) deficiency    per pt K+ deficiency - ? from meds she takes ( none of which are diuretucs)  . SVD (spontaneous vaginal delivery)    x 2    Patient Active Problem List   Diagnosis Date Noted  . Chest pain at rest 05/22/2019  . Attention deficit hyperactivity disorder (ADHD), predominantly inattentive type 09/18/2017  . Depression 01/18/2016  . Syncope 01/18/2016  . Hyperlipidemia   . GERD (gastroesophageal reflux disease)   . "walking corpse" syndrome   . Alteration of consciousness    . Symptomatic cholelithiasis 07/26/2012  . Bipolar disorder (HCC) 07/26/2012  . Chest pain 12/12/2011  . Hypotension 12/12/2011    Past Surgical History:  Procedure Laterality Date  . CESAREAN SECTION     x 1  . CHOLECYSTECTOMY  07/27/2012   Procedure: LAPAROSCOPIC CHOLECYSTECTOMY;  Surgeon: Shelly Rubensteinouglas A Blackman, MD;  Location: [facility] ORS;  Service: General;  Laterality: N/A;  . TUBAL LIGATION     x 2  . WISDOM TOOTH EXTRACTION       OB History   No obstetric history on file.      Home Medications    Prior to Admission medications   Medication Sig Start Date End Date Taking? Authorizing Provider  ARIPiprazole (ABILIFY) 10 MG tablet Take 10 mg by mouth at bedtime.    [provider]  aspirin EC 81 MG EC tablet Take 1 tablet (81 mg total) by mouth daily. 05/24/19   Dorcas CarrowGhimire, Kuber, MD  doxycycline (VIBRAMYCIN) 100 MG capsule Take 1 capsule (100 mg total) by mouth 2 (two) times daily. One po bid x 7 days 09/08/19   Bethann BerkshireZammit, Georgana Romain, MD  DULoxetine (CYMBALTA) 60 MG capsule Take 60 mg by mouth at bedtime.     [provider]  furosemide (LASIX) 20 MG tablet Take 20 mg by mouth daily.    [provider]  naproxen (NAPROSYN) 500 MG  tablet Take 1 tablet (500 mg total) by mouth 2 (two) times daily. Patient taking differently: Take 500 mg by mouth 2 (two) times daily as needed for mild pain.  09/09/17   Glyn Ade, PA-C  OLANZapine (ZYPREXA) 5 MG tablet Take 5 mg by mouth at bedtime.    [provider]  topiramate (TOPAMAX) 100 MG tablet Take 100 mg by mouth at bedtime.     [provider]    Family History Family History  Problem Relation Age of Onset  . Dilated cardiomyopathy Mother        with ICD  . Heart disease Mother   . Atrial fibrillation Mother   . Lung cancer Maternal Grandfather        black lung  . Cancer Maternal Grandfather   . Diabetes type II Father   . Cancer Father   . Kidney failure Father   . Dementia Maternal  Grandmother   . Cancer Maternal Grandmother   . Stroke Maternal Grandmother   . Breast cancer Other   . Healthy Brother   . Healthy Child        x3    Social History Social History   Tobacco Use  . Smoking status: Never Smoker  . Smokeless tobacco: Never Used  Substance Use Topics  . Alcohol use: No  . Drug use: No     Allergies   Codeine   Review of Systems Review of Systems  Constitutional: Positive for fever. Negative for appetite change and fatigue.  HENT: Negative for congestion, ear discharge and sinus pressure.   Eyes: Negative for discharge.  Respiratory: Positive for cough and shortness of breath.   Cardiovascular: Negative for chest pain.  Gastrointestinal: Negative for abdominal pain and diarrhea.  Genitourinary: Negative for frequency and hematuria.  Musculoskeletal: Negative for back pain.  Skin: Negative for rash.  Neurological: Negative for seizures and headaches.  Psychiatric/Behavioral: Negative for hallucinations.     Physical Exam Updated Vital Signs BP (!) 136/95 (BP Location: Right Arm)   Pulse 90   Temp (!) 97.4 F (36.3 C) (Oral)   Resp 20   LMP 08/18/2012   SpO2 99%   Physical Exam Vitals signs and nursing note reviewed.  Constitutional:      Appearance: She is well-developed.  HENT:     Head: Normocephalic.     Nose: Nose normal.  Eyes:     General: No scleral icterus.    Conjunctiva/sclera: Conjunctivae normal.  Neck:     Musculoskeletal: Neck supple.     Thyroid: No thyromegaly.  Cardiovascular:     Rate and Rhythm: Normal rate and regular rhythm.     Heart sounds: No murmur. No friction rub. No gallop.   Pulmonary:     Breath sounds: No stridor. No wheezing or rales.  Chest:     Chest wall: No tenderness.  Abdominal:     General: There is no distension.     Tenderness: There is no abdominal tenderness. There is no rebound.  Musculoskeletal: Normal range of motion.  Lymphadenopathy:     Cervical: No cervical  adenopathy.  Skin:    Findings: No erythema or rash.  Neurological:     Mental Status: She is alert and oriented to person, place, and time.     Motor: No abnormal muscle tone.     Coordination: Coordination normal.  Psychiatric:        Behavior: Behavior normal.      ED Treatments / Results  Labs (all labs ordered are listed, but only abnormal results are displayed) Labs Reviewed  COMPREHENSIVE METABOLIC PANEL - Abnormal; Notable for the following components:      Result Value   CO2 20 (*)    Glucose, Bld 103 (*)    Calcium 8.8 (*)    AST 76 (*)    ALT 67 (*)    Alkaline Phosphatase 140 (*)    All other components within normal limits  SARS CORONAVIRUS 2 (TAT 6-24 HRS)  LACTIC ACID, PLASMA  CBC WITH DIFFERENTIAL/PLATELET  I-STAT BETA HCG BLOOD, ED ([facility], [facility], [facility] ONLY)    EKG EKG Interpretation  Date/Time:  Sunday September 08 2019 15:46:19 EST Ventricular Rate:  110 PR Interval:  140 QRS Duration: 78 QT Interval:  304 QTC Calculation: 411 R Axis:   27 Text Interpretation: Sinus tachycardia No STEMI Confirmed by Trifan, Matthew ([id number]) on 09/08/2019 4:31:04 PM   Radiology Dg Chest Portable 1 View  Result Date: 09/08/2019 CLINICAL DATA:  Productive cough, fever, and shortness of breath. EXAM: PORTABLE CHEST 1 VIEW COMPARISON:  05/22/2019 FINDINGS: The heart size and mediastinal contours are within normal limits. New mild streaky opacity seen in both lung bases, which may be due to atelectasis or early infiltrates. No evidence of pleural effusion. IMPRESSION: New mild bibasilar atelectasis versus early infiltrates. Electronically Signed   By: John A Stahl M.D.   On: 09/08/2019 17:26    Procedures Procedures (including critical care time)  Medications Ordered in ED Medications  sodium chloride flush (NS) 0.9 % injection 3 mL (has no administration in time range)     Initial Impression / Assessment and Plan / ED Course  I have reviewed the triage vital signs and  the nursing notes.  Pertinent labs & imaging results that were available during my care of the patient were reviewed by me and considered in my medical decision making (see chart for details).       Labs unremarkable.  Chest x-ray shows possible infiltrate.  Patient will be placed on doxycycline for possible pneumonia and her Covid test is pending.  She will follow up with her PCP  Final Clinical Impressions(s) / ED Diagnoses   Final diagnoses:  Cough    ED Discharge Orders         Ordered    doxycycline (VIBRAMYCIN) 100 MG capsule  2 times daily     11 /01/20 1751           02-04-1999, MD 09/08/19 1756

## 2019-09-08 NOTE — ED Notes (Signed)
Pt discharge instructions reviewed. Pt verbalized understanding of instructions. Pt discharged. 

## 2019-09-08 NOTE — ED Triage Notes (Signed)
C/o SOB since earlier today.  Seen by PCP on Wednesday and diagnosed with sinus infection but antibiotics aren't helping.  Reports cough with yellow phlegm and fever of 102 at home prior to Aleve.

## 2019-09-09 LAB — SARS CORONAVIRUS 2 (TAT 6-24 HRS): SARS Coronavirus 2: POSITIVE — AB

## 2019-09-10 ENCOUNTER — Emergency Department (HOSPITAL_COMMUNITY): Payer: BC Managed Care – PPO

## 2019-09-10 ENCOUNTER — Encounter (HOSPITAL_COMMUNITY): Payer: Self-pay | Admitting: Emergency Medicine

## 2019-09-10 ENCOUNTER — Inpatient Hospital Stay (HOSPITAL_COMMUNITY)
Admission: EM | Admit: 2019-09-10 | Discharge: 2019-09-13 | DRG: 177 | Disposition: A | Discharge status: Home / Self Care | Payer: BC Managed Care – PPO | Attending: Family Medicine | Admitting: Family Medicine

## 2019-09-10 ENCOUNTER — Other Ambulatory Visit: Payer: Self-pay

## 2019-09-10 DIAGNOSIS — E785 Hyperlipidemia, unspecified: Secondary | ICD-10-CM | POA: Diagnosis present

## 2019-09-10 DIAGNOSIS — J069 Acute upper respiratory infection, unspecified: Secondary | ICD-10-CM | POA: Diagnosis not present

## 2019-09-10 DIAGNOSIS — G43909 Migraine, unspecified, not intractable, without status migrainosus: Secondary | ICD-10-CM | POA: Diagnosis present

## 2019-09-10 DIAGNOSIS — Z841 Family history of disorders of kidney and ureter: Secondary | ICD-10-CM

## 2019-09-10 DIAGNOSIS — E860 Dehydration: Secondary | ICD-10-CM | POA: Diagnosis present

## 2019-09-10 DIAGNOSIS — J9601 Acute respiratory failure with hypoxia: Secondary | ICD-10-CM | POA: Diagnosis present

## 2019-09-10 DIAGNOSIS — Z6841 Body Mass Index (BMI) 40.0 and over, adult: Secondary | ICD-10-CM | POA: Diagnosis not present

## 2019-09-10 DIAGNOSIS — Z823 Family history of stroke: Secondary | ICD-10-CM

## 2019-09-10 DIAGNOSIS — R0602 Shortness of breath: Secondary | ICD-10-CM

## 2019-09-10 DIAGNOSIS — F3181 Bipolar II disorder: Secondary | ICD-10-CM | POA: Diagnosis present

## 2019-09-10 DIAGNOSIS — U071 COVID-19: Secondary | ICD-10-CM | POA: Diagnosis present

## 2019-09-10 DIAGNOSIS — Z885 Allergy status to narcotic agent status: Secondary | ICD-10-CM | POA: Diagnosis not present

## 2019-09-10 DIAGNOSIS — F900 Attention-deficit hyperactivity disorder, predominantly inattentive type: Secondary | ICD-10-CM | POA: Diagnosis present

## 2019-09-10 DIAGNOSIS — F319 Bipolar disorder, unspecified: Secondary | ICD-10-CM | POA: Diagnosis present

## 2019-09-10 DIAGNOSIS — K219 Gastro-esophageal reflux disease without esophagitis: Secondary | ICD-10-CM | POA: Diagnosis present

## 2019-09-10 DIAGNOSIS — Z8249 Family history of ischemic heart disease and other diseases of the circulatory system: Secondary | ICD-10-CM | POA: Diagnosis not present

## 2019-09-10 DIAGNOSIS — Z801 Family history of malignant neoplasm of trachea, bronchus and lung: Secondary | ICD-10-CM

## 2019-09-10 DIAGNOSIS — Z7982 Long term (current) use of aspirin: Secondary | ICD-10-CM | POA: Diagnosis not present

## 2019-09-10 DIAGNOSIS — Z833 Family history of diabetes mellitus: Secondary | ICD-10-CM | POA: Diagnosis not present

## 2019-09-10 DIAGNOSIS — R112 Nausea with vomiting, unspecified: Secondary | ICD-10-CM | POA: Diagnosis present

## 2019-09-10 LAB — CBC WITH DIFFERENTIAL/PLATELET
Abs Immature Granulocytes: 0.01 10*3/uL (ref 0.00–0.07)
Basophils Absolute: 0 10*3/uL (ref 0.0–0.1)
Basophils Relative: 0 %
Eosinophils Absolute: 0 10*3/uL (ref 0.0–0.5)
Eosinophils Relative: 0 %
HCT: 41.2 % (ref 36.0–46.0)
Hemoglobin: 13.2 g/dL (ref 12.0–15.0)
Immature Granulocytes: 0 %
Lymphocytes Relative: 23 %
Lymphs Abs: 1 10*3/uL (ref 0.7–4.0)
MCH: 29.3 pg (ref 26.0–34.0)
MCHC: 32 g/dL (ref 30.0–36.0)
MCV: 91.4 fL (ref 80.0–100.0)
Monocytes Absolute: 0.3 10*3/uL (ref 0.1–1.0)
Monocytes Relative: 6 %
Neutro Abs: 2.9 10*3/uL (ref 1.7–7.7)
Neutrophils Relative %: 71 %
Platelets: 177 10*3/uL (ref 150–400)
RBC: 4.51 MIL/uL (ref 3.87–5.11)
RDW: 13.1 % (ref 11.5–15.5)
WBC: 4.2 10*3/uL (ref 4.0–10.5)
nRBC: 0 % (ref 0.0–0.2)

## 2019-09-10 LAB — COMPREHENSIVE METABOLIC PANEL
ALT: 102 U/L — ABNORMAL HIGH (ref 0–44)
AST: 119 U/L — ABNORMAL HIGH (ref 15–41)
Albumin: 3.4 g/dL — ABNORMAL LOW (ref 3.5–5.0)
Alkaline Phosphatase: 198 U/L — ABNORMAL HIGH (ref 38–126)
Anion gap: 11 (ref 5–15)
BUN: 17 mg/dL (ref 6–20)
CO2: 19 mmol/L — ABNORMAL LOW (ref 22–32)
Calcium: 8.5 mg/dL — ABNORMAL LOW (ref 8.9–10.3)
Chloride: 110 mmol/L (ref 98–111)
Creatinine, Ser: 0.96 mg/dL (ref 0.44–1.00)
GFR calc Af Amer: >60 mL/min (ref >60)
GFR calc non Af Amer: >60 mL/min (ref >60)
Glucose, Bld: 110 mg/dL — ABNORMAL HIGH (ref 70–99)
Potassium: 3.6 mmol/L (ref 3.5–5.1)
Sodium: 140 mmol/L (ref 135–145)
Total Bilirubin: 0.5 mg/dL (ref 0.3–1.2)
Total Protein: 6.7 g/dL (ref 6.5–8.1)

## 2019-09-10 LAB — LACTATE DEHYDROGENASE: LDH: 332 U/L — ABNORMAL HIGH (ref 98–192)

## 2019-09-10 LAB — PROCALCITONIN: Procalcitonin: <0.1 ng/mL

## 2019-09-10 LAB — C-REACTIVE PROTEIN: CRP: 11.5 mg/dL — ABNORMAL HIGH (ref <1.0)

## 2019-09-10 LAB — BRAIN NATRIURETIC PEPTIDE: B Natriuretic Peptide: 6.1 pg/mL (ref 0.0–100.0)

## 2019-09-10 LAB — FERRITIN: Ferritin: 184 ng/mL (ref 11–307)

## 2019-09-10 LAB — FIBRINOGEN: Fibrinogen: 577 mg/dL — ABNORMAL HIGH (ref 210–475)

## 2019-09-10 LAB — TROPONIN I (HIGH SENSITIVITY): Troponin I (High Sensitivity): 4 ng/L (ref <18)

## 2019-09-10 LAB — D-DIMER, QUANTITATIVE: D-Dimer, Quant: 0.83 ug/mL-FEU — ABNORMAL HIGH (ref 0.00–0.50)

## 2019-09-10 LAB — LIPASE, BLOOD: Lipase: 32 U/L (ref 11–51)

## 2019-09-10 MED ORDER — ARIPIPRAZOLE 15 MG PO TABS
15.0000 mg | ORAL_TABLET | Freq: Every day | ORAL | Status: DC
  Administered 2019-09-10 – 2019-09-12 (×3): 15 mg via ORAL
  Filled 2019-09-10 (×4): qty 1

## 2019-09-10 MED ORDER — TOPIRAMATE 100 MG PO TABS
100.0000 mg | ORAL_TABLET | Freq: Every day | ORAL | Status: DC
  Administered 2019-09-10 – 2019-09-12 (×3): 100 mg via ORAL
  Filled 2019-09-10 (×4): qty 1

## 2019-09-10 MED ORDER — ONDANSETRON HCL 4 MG/2ML IJ SOLN
4.0000 mg | Freq: Four times a day (QID) | INTRAMUSCULAR | Status: DC | PRN
  Administered 2019-09-10: 4 mg via INTRAVENOUS
  Filled 2019-09-10: qty 2

## 2019-09-10 MED ORDER — OLANZAPINE 10 MG PO TABS: 10.0000 mg | ORAL_TABLET | Freq: Every day | ORAL | Status: DC

## 2019-09-10 MED ORDER — ASPIRIN 81 MG PO TBEC
81.0000 mg | DELAYED_RELEASE_TABLET | Freq: Every day | ORAL | Status: DC
  Administered 2019-09-11 – 2019-09-13 (×3): 81 mg via ORAL
  Filled 2019-09-10 (×7): qty 1

## 2019-09-10 MED ORDER — DULOXETINE HCL 60 MG PO CPEP
60.0000 mg | ORAL_CAPSULE | Freq: Every day | ORAL | Status: DC
  Administered 2019-09-10 – 2019-09-12 (×3): 60 mg via ORAL
  Filled 2019-09-10 (×4): qty 1

## 2019-09-10 MED ORDER — SODIUM CHLORIDE 0.9% FLUSH: 3.0000 mL | INTRAVENOUS | Status: DC | PRN

## 2019-09-10 MED ORDER — OXYCODONE HCL 5 MG PO TABS: 5.0000 mg | ORAL_TABLET | ORAL | Status: DC | PRN

## 2019-09-10 MED ORDER — PANTOPRAZOLE SODIUM 40 MG PO TBEC
40.0000 mg | DELAYED_RELEASE_TABLET | Freq: Every day | ORAL | Status: DC
  Administered 2019-09-11 – 2019-09-13 (×3): 40 mg via ORAL
  Filled 2019-09-10 (×3): qty 1

## 2019-09-10 MED ORDER — SODIUM CHLORIDE 0.9 % IV SOLN
INTRAVENOUS | Status: DC
  Administered 2019-09-10: 15:00:00 via INTRAVENOUS

## 2019-09-10 MED ORDER — SODIUM CHLORIDE 0.9 % IV BOLUS
1000.0000 mL | Freq: Once | INTRAVENOUS | Status: AC
  Administered 2019-09-10: 1000 mL via INTRAVENOUS

## 2019-09-10 MED ORDER — POLYETHYLENE GLYCOL 3350 17 G PO PACK: 17.0000 g | PACK | Freq: Every day | ORAL | Status: DC | PRN

## 2019-09-10 MED ORDER — ENOXAPARIN SODIUM 60 MG/0.6ML ~~LOC~~ SOLN
0.5000 mg/kg | SUBCUTANEOUS | Status: DC
  Administered 2019-09-10 – 2019-09-12 (×3): 60 mg via SUBCUTANEOUS
  Filled 2019-09-10 (×3): qty 0.6

## 2019-09-10 MED ORDER — METHYLPREDNISOLONE SODIUM SUCC 125 MG IJ SOLR
0.5000 mg/kg | Freq: Two times a day (BID) | INTRAMUSCULAR | Status: AC
  Administered 2019-09-10 – 2019-09-11 (×3): 60 mg via INTRAVENOUS
  Filled 2019-09-10 (×3): qty 2

## 2019-09-10 MED ORDER — ZOLPIDEM TARTRATE 5 MG PO TABS
5.0000 mg | ORAL_TABLET | Freq: Every evening | ORAL | Status: DC | PRN
  Administered 2019-09-11 – 2019-09-12 (×2): 5 mg via ORAL
  Filled 2019-09-10 (×2): qty 1

## 2019-09-10 MED ORDER — SODIUM CHLORIDE 0.9% FLUSH
3.0000 mL | Freq: Two times a day (BID) | INTRAVENOUS | Status: DC
  Administered 2019-09-10 – 2019-09-12 (×5): 3 mL via INTRAVENOUS

## 2019-09-10 MED ORDER — ACETAMINOPHEN 325 MG PO TABS
650.0000 mg | ORAL_TABLET | Freq: Four times a day (QID) | ORAL | Status: DC | PRN
  Administered 2019-09-10 – 2019-09-11 (×2): 650 mg via ORAL
  Filled 2019-09-10 (×2): qty 2

## 2019-09-10 MED ORDER — METOCLOPRAMIDE HCL 5 MG/ML IJ SOLN
5.0000 mg | Freq: Once | INTRAMUSCULAR | Status: AC
  Administered 2019-09-10: 5 mg via INTRAVENOUS
  Filled 2019-09-10: qty 2

## 2019-09-10 MED ORDER — SODIUM CHLORIDE 0.9 % IV SOLN
200.0000 mg | Freq: Once | INTRAVENOUS | Status: AC
  Administered 2019-09-10: 200 mg via INTRAVENOUS
  Filled 2019-09-10: qty 40

## 2019-09-10 MED ORDER — SODIUM CHLORIDE 0.9 % IV SOLN
100.0000 mg | INTRAVENOUS | Status: DC
  Administered 2019-09-11 – 2019-09-12 (×2): 100 mg via INTRAVENOUS
  Filled 2019-09-10 (×3): qty 20

## 2019-09-10 MED ORDER — SODIUM CHLORIDE 0.9 % IV SOLN: 250.0000 mL | INTRAVENOUS | Status: DC | PRN

## 2019-09-10 MED ORDER — SODIUM CHLORIDE 0.9 % IV SOLN
500.0000 mg | INTRAVENOUS | Status: DC
  Administered 2019-09-10: 500 mg via INTRAVENOUS
  Filled 2019-09-10: qty 500

## 2019-09-10 MED ORDER — BISACODYL 5 MG PO TBEC: 5.0000 mg | DELAYED_RELEASE_TABLET | Freq: Every day | ORAL | Status: DC | PRN

## 2019-09-10 MED ORDER — ONDANSETRON HCL 4 MG PO TABS
4.0000 mg | ORAL_TABLET | Freq: Four times a day (QID) | ORAL | Status: DC | PRN
  Administered 2019-09-11: 4 mg via ORAL
  Filled 2019-09-10: qty 1

## 2019-09-10 MED ORDER — DOCUSATE SODIUM 100 MG PO CAPS
100.0000 mg | ORAL_CAPSULE | Freq: Two times a day (BID) | ORAL | Status: DC
  Administered 2019-09-10: 100 mg via ORAL
  Filled 2019-09-10 (×4): qty 1

## 2019-09-10 MED ORDER — SODIUM CHLORIDE 0.9 % IV SOLN
1.0000 g | INTRAVENOUS | Status: DC
  Administered 2019-09-10: 1 g via INTRAVENOUS
  Filled 2019-09-10: qty 10

## 2019-09-10 NOTE — ED Notes (Signed)
ED TO INPATIENT HANDOFF REPORT  ED Nurse Name and Phone #: [phone] Wendie SimmerMelanie F., RN  S Name/Age/Gender Alexandra Stein 53 y.o. female Room/Bed: 019C/019C  Code Status   Code Status: Full Code  Home/SNF/Other Home Patient oriented to: self, place, time and situation Is this baseline? Yes   Triage Complete: Triage complete  Chief Complaint sob vomitting covid+  Triage Note Patient c/o shortness of breath and about 5 episodes of emesis today. Patient states positive Covid test yesterday. C/o sore throat from cough along with body aches.    Allergies Allergies  Allergen Reactions  . Codeine Other (See Comments)    Hallucinations and fever. Pt CAN take Vicodin and Percocet.    Level of Care/Admitting Diagnosis ED Disposition    ED Disposition Condition Comment   Admit  [facility] [[id number]]  Level of Care: Med-Surg [16]  Covid Evaluation: Confirmed COVID Positive  Diagnosis: Acute respiratory disease due to COVID-19 virus [[id number]][[phone]]  Admitting Physician: Jonah BlueYATES, JENNIFER [[id number]]  Attending Physician: Jonah BlueYATES, JENNIFER [[id number]]  Estimated length of stay: 5 - 7 days  Certification:: I certify this patient will need inpatient services for at least 2 midnights  PT Class (Do Not Modify): Inpatient [101]  PT Acc Code (Do Not Modify): Private [1]       B Medical/Surgery History Past Medical History:  Diagnosis Date  . Bipolar 2 disorder (HCC)   . GERD (gastroesophageal reflux disease)   . H/O chest pain    dx'd with GERD  . History of syncope 2002   Vasovagal  . Hyperlipidemia   . Migraines   . Potassium (K) deficiency    per pt K+ deficiency - ? from meds she takes ( none of which are diuretucs)  . SVD (spontaneous vaginal delivery)    x 2   Past Surgical History:  Procedure Laterality Date  . CESAREAN SECTION     x 1  . CHOLECYSTECTOMY  07/27/2012   Procedure: LAPAROSCOPIC CHOLECYSTECTOMY;  Surgeon: Shelly Rubensteinouglas A Blackman, MD;   Location: [facility];  Service: General;  Laterality: N/A;  . TUBAL LIGATION     x 2  . WISDOM TOOTH EXTRACTION       A IV Location/Drains/Wounds Patient Lines/Drains/Airways Status   Active Line/Drains/Airways    Name:   Placement date:   Placement time:   Site:   Days:   Peripheral IV 09/10/19 Right;Lateral Forearm   09/10/19    1453    Forearm   less than 1   Incision 07/27/12 Abdomen Other (Comment)   07/27/12    1349     2601   Incision 08/27/12 Perineum Other (Comment)   08/27/12    0705     2570   Incision - 4 Ports Abdomen Umbilicus Superior Superior;Right Right;Lateral   07/27/12    1339     2601          Intake/Output Last 24 hours  Intake/Output Summary (Last 24 hours) at 09/10/2019 1948 Last data filed at 09/10/2019 1650 Gross per 24 hour  Intake 1000 ml  Output -  Net 1000 ml    Labs/Imaging Results for orders placed or performed during the hospital encounter of 09/10/19 (from the past 48 hour(s))  CBC with Differential/Platelet     Status: None   Collection Time: 09/10/19  2:25 PM  Result Value Ref Range   WBC 4.2 4.0 - 10.5 K/uL   RBC 4.51 3.87 - 5.11 MIL/uL   Hemoglobin 13.2  12.0 - 15.0 g/dL   HCT 41.2 36.0 - 46.0 %   MCV 91.4 80.0 - 100.0 fL   MCH 29.3 26.0 - 34.0 pg   MCHC 32.0 30.0 - 36.0 g/dL   RDW 13.1 11.5 - 15.5 %   Platelets 177 150 - 400 K/uL   nRBC 0.0 0.0 - 0.2 %   Neutrophils Relative % 71 %   Neutro Abs 2.9 1.7 - 7.7 K/uL   Lymphocytes Relative 23 %   Lymphs Abs 1.0 0.7 - 4.0 K/uL   Monocytes Relative 6 %   Monocytes Absolute 0.3 0.1 - 1.0 K/uL   Eosinophils Relative 0 %   Eosinophils Absolute 0.0 0.0 - 0.5 K/uL   Basophils Relative 0 %   Basophils Absolute 0.0 0.0 - 0.1 K/uL   Immature Granulocytes 0 %   Abs Immature Granulocytes 0.01 0.00 - 0.07 K/uL    Comment: Performed at [facility] [address], [place], [place] [address]  Comprehensive metabolic panel     Status: Abnormal   Collection Time: 09/10/19  2:25 PM  Result  Value Ref Range   Sodium 140 135 - 145 mmol/L   Potassium 3.6 3.5 - 5.1 mmol/L   Chloride 110 98 - 111 mmol/L   CO2 19 (L) 22 - 32 mmol/L   Glucose, Bld 110 (H) 70 - 99 mg/dL   BUN 17 6 - 20 mg/dL   Creatinine, Ser 0.96 0.44 - 1.00 mg/dL   Calcium 8.5 (L) 8.9 - 10.3 mg/dL   Total Protein 6.7 6.5 - 8.1 g/dL   Albumin 3.4 (L) 3.5 - 5.0 g/dL   AST 119 (H) 15 - 41 U/L   ALT 102 (H) 0 - 44 U/L   Alkaline Phosphatase 198 (H) 38 - 126 U/L   Total Bilirubin 0.5 0.3 - 1.2 mg/dL   GFR calc non Af Amer >60 >60 mL/min   GFR calc Af Amer >60 >60 mL/min   Anion gap 11 5 - 15    Comment: Performed at [facility], [place] [address], [place], [place] [address]  Lipase, blood     Status: None   Collection Time: 09/10/19  2:25 PM  Result Value Ref Range   Lipase 32 11 - 51 U/L    Comment: Performed at [facility], [place] [address] [place], [place] [address]  Brain natriuretic peptide     Status: None   Collection Time: 09/10/19  4:49 PM  Result Value Ref Range   B Natriuretic Peptide 6.1 0.0 - 100.0 pg/mL    Comment: Performed at [facility] [address], [place], [place] [address]  C-reactive protein     Status: Abnormal   Collection Time: 09/10/19  4:49 PM  Result Value Ref Range   CRP 11.5 (H) <1.0 mg/dL    Comment: Performed at [facility], [place] [address] [place], [place] [address]  D-dimer, quantitative (not at [facility])     Status: Abnormal   Collection Time: 09/10/19  4:49 PM  Result Value Ref Range   D-Dimer, Quant 0.83 (H) 0.00 - 0.50 ug/mL-FEU    Comment: (NOTE) At the manufacturer cut-off of 0.50 ug/mL FEU, this assay has been documented to exclude PE with a sensitivity and negative predictive value of 97 to 99%.  At this time, this assay has not been approved by the FDA to exclude DVT/VTE. Results should be correlated with clinical presentation. Performed at [facility],  [address] [place], [place] [address]   Ferritin     Status: None    Collection Time: 09/10/19  4:49 PM  Result Value Ref Range   Ferritin 184 11 - 307 ng/mL    Comment: Performed at [facility], [address], [place], [place] [address]  Fibrinogen     Status: Abnormal   Collection Time: 09/10/19  4:49 PM  Result Value Ref Range   Fibrinogen 577 (H) 210 - 475 mg/dL    Comment: Performed at [facility], [address] [place], [place] [address]  Lactate dehydrogenase     Status: Abnormal   Collection Time: 09/10/19  4:49 PM  Result Value Ref Range   LDH 332 (H) 98 - 192 U/L    Comment: Performed at [facility], [address], [place], [place] [address]  Procalcitonin     Status: None   Collection Time: 09/10/19  4:49 PM  Result Value Ref Range   Procalcitonin <0.10 ng/mL    Comment:        Interpretation: PCT (Procalcitonin) <= 0.5 ng/mL: Systemic infection (sepsis) is not likely. Local bacterial infection is possible. (NOTE)       Sepsis PCT Algorithm           Lower Respiratory Tract                                      Infection PCT Algorithm    ----------------------------     ----------------------------         PCT < 0.25 ng/mL                PCT < 0.10 ng/mL         Strongly encourage             Strongly discourage   discontinuation of antibiotics    initiation of antibiotics    ----------------------------     -----------------------------       PCT 0.25 - 0.50 ng/mL            PCT 0.10 - 0.25 ng/mL               OR       >80% decrease in PCT            Discourage initiation of                                            antibiotics      Encourage discontinuation           of antibiotics    ----------------------------     -----------------------------         PCT >= 0.50 ng/mL              PCT 0.26 - 0.50 ng/mL               AND        <80% decrease in PCT             Encourage initiation of  antibiotics       Encourage continuation           of antibiotics     ----------------------------     -----------------------------        PCT >= 0.50 ng/mL                  PCT > 0.50 ng/mL               AND         increase in PCT                  Strongly encourage                                      initiation of antibiotics    Strongly encourage escalation           of antibiotics                                     -----------------------------                                           PCT <= 0.25 ng/mL                                                 OR                                        > 80% decrease in PCT                                     Discontinue / Do not initiate                                             antibiotics Performed at [facility], [address], [place], [place] [address]   Troponin I (High Sensitivity)     Status: None   Collection Time: 09/10/19  4:49 PM  Result Value Ref Range   Troponin I (High Sensitivity) 4 <18 ng/L    Comment: (NOTE) Elevated high sensitivity troponin I (hsTnI) values and significant  changes across serial measurements may suggest ACS but many other  chronic and acute conditions are known to elevate hsTnI results.  Refer to the "Links" section for chest pain algorithms and additional  guidance. Performed at [facility], [address], [place], [place] [address]    Dg Chest Port 1 View  Result Date: 09/10/2019 CLINICAL DATA:  53 year old female with shortness of breath and vomiting. Tested positive for COVID-19. Yesterday. EXAM: PORTABLE CHEST 1 VIEW COMPARISON:  Portable chest 09/08/2019 and earlier. FINDINGS: Portable AP upright view at 1417 hours. Stable somewhat low lung volumes. Mediastinal contours remain normal. Visualized tracheal air column is  within normal limits. Mild patchy and indistinct mid and lower lung opacities, mildly progressed from 09/08/2019. No consolidation, pneumothorax, pulmonary edema or pleural effusion. Negative visible bowel gas pattern. No acute  osseous abnormality identified. IMPRESSION: Mild progression from 09/08/2019 of patchy and indistinct mid and lower lung opacities compatible with COVID-19 pneumonia. Electronically Signed   By: Odessa Fleming M.D.   On: 09/10/2019 14:25    Pending Labs Unresulted Labs (From admission, onward)    Start     Ordered   09/11/19 0500  CBC with Differential/Platelet  Daily,   R     09/10/19 1752   09/11/19 0500  Comprehensive metabolic panel  Daily,   R     09/10/19 1752   09/11/19 0500  C-reactive protein  Daily,   R     09/10/19 1752   09/11/19 0500  D-dimer, quantitative (not at [facility])  Daily,   R     09/10/19 1752   09/11/19 0500  Ferritin  Daily,   R     09/10/19 1752   09/11/19 0500  Magnesium  Daily,   R     09/10/19 1752   09/11/19 0500  Phosphorus  Daily,   R     09/10/19 1752          Vitals/Pain Today's Vitals   09/10/19 1900 09/10/19 1931 09/10/19 1932 09/10/19 1945  BP:  (!) 158/93  (!) 149/87  Pulse:   93 (!) 101  Resp:  (!) 26 (!) 23 (!) 22  Temp:      TempSrc:      SpO2:   95% 99%  Weight: 120 kg     PainSc:        Isolation Precautions Airborne and Contact precautions  Medications Medications  aspirin EC tablet 81 mg (has no administration in time range)  ARIPiprazole (ABILIFY) tablet 15 mg (has no administration in time range)  DULoxetine (CYMBALTA) DR capsule 60 mg (has no administration in time range)  pantoprazole (PROTONIX) EC tablet 40 mg (has no administration in time range)  topiramate (TOPAMAX) tablet 100 mg (has no administration in time range)  enoxaparin (LOVENOX) injection 60 mg (has no administration in time range)  sodium chloride flush (NS) 0.9 % injection 3 mL (has no administration in time range)  sodium chloride flush (NS) 0.9 % injection 3 mL (has no administration in time range)  0.9 %  sodium chloride infusion (has no administration in time range)  acetaminophen (TYLENOL) tablet 650 mg (650 mg Oral Given 09/10/19 1940)  oxyCODONE (Oxy  IR/ROXICODONE) immediate release tablet 5 mg (has no administration in time range)  zolpidem (AMBIEN) tablet 5 mg (has no administration in time range)  docusate sodium (COLACE) capsule 100 mg (has no administration in time range)  polyethylene glycol (MIRALAX / GLYCOLAX) packet 17 g (has no administration in time range)  bisacodyl (DULCOLAX) EC tablet 5 mg (has no administration in time range)  ondansetron (ZOFRAN) tablet 4 mg (has no administration in time range)    Or  ondansetron (ZOFRAN) injection 4 mg (has no administration in time range)  methylPREDNISolone sodium succinate (SOLU-MEDROL) 125 mg/2 mL injection 60 mg (60 mg Intravenous Given 09/10/19 1941)  cefTRIAXone (ROCEPHIN) 1 g in sodium chloride 0.9 % 100 mL IVPB (1 g Intravenous New Bag/Given 09/10/19 1946)  azithromycin (ZITHROMAX) 500 mg in sodium chloride 0.9 % 250 mL IVPB (has no administration in time range)  remdesivir 200 mg in sodium chloride 0.9 % 250 mL IVPB (200  mg Intravenous New Bag/Given 09/10/19 1934)    Followed by  remdesivir 100 mg in sodium chloride 0.9 % 250 mL IVPB (has no administration in time range)  sodium chloride 0.9 % bolus 1,000 mL (0 mLs Intravenous Stopped 09/10/19 1650)  metoCLOPramide (REGLAN) injection 5 mg (5 mg Intravenous Given 09/10/19 1507)    Mobility walks Low fall risk   Focused Assessments Pulmonary Assessment Handoff:  Lung sounds:   O2 Device: Room Air        R Recommendations: See Admitting Provider Note  Report given to:   Additional Notes:

## 2019-09-10 NOTE — H&P (Signed)
History and Physical    Alexandra Stein IRS:[id number] DOB: [birth year] DOA: 09/10/2019  PCP: Maury Dus, MD Consultants:  None Patient coming from:  Home - lives with husband; NOKJaclynn Stein, [phone]  Chief Complaint: SOB with COVID-19 infection  HPI: Alexandra Stein is a 53 y.o. female with medical history significant of bipolar d/o; HLD; and morbid obesity (BMI 48) presenting with worsening COVID symptoms.  She developed cough last Thursday and and was seen by PCP and started on azithromycin.  She presented to the ER on 11/1 with fever and cough; CXR showed possible infiltrate and so Azithro was changed to Doxy.   She was notified yesterday that her COVID test was positive, and has had progressive SOB, emesis, and myalgias.  She called her PCP, who encouraged her to return to the ER.  She is uncertain how she acquired COVID but her husband and family members with whom she has had recent contact are all in quarantine and planning to be tested.   ED Course:  COVID - seen yesterday, weak, feels awful.  Mild SOB, no hypoxia, CXR with worsening PNA compared to yesterday.  Taking Doxy.  Review of Systems: As per HPI; otherwise review of systems reviewed and negative.   Ambulatory Status:  Ambulates without assistance  Past Medical History:  Diagnosis Date  . Bipolar 2 disorder ([place])   . GERD (gastroesophageal reflux disease)   . H/O chest pain    dx'd with GERD  . History of syncope 2002   Vasovagal  . Hyperlipidemia   . Migraines   . Potassium (K) deficiency    per pt K+ deficiency - ? from meds she takes ( none of which are diuretucs)  . SVD (spontaneous vaginal delivery)    x 2    Past Surgical History:  Procedure Laterality Date  . CESAREAN SECTION     x 1  . CHOLECYSTECTOMY  07/27/2012   Procedure: LAPAROSCOPIC CHOLECYSTECTOMY;  Surgeon: Harl Bowie, MD;  Location: [facility];  Service: General;  Laterality: N/A;  . TUBAL LIGATION     x 2  . WISDOM  TOOTH EXTRACTION      Social History   Socioeconomic History  . Marital status: Married    Spouse name: Not on file  . Number of children: 3  . Years of education: Not on file  . Highest education level: Not on file  Occupational History  . Occupation: Product manager: [employer]  Social Needs  . Financial resource strain: Not on file  . Food insecurity    Worry: Not on file    Inability: Not on file  . Transportation needs    Medical: Not on file    Non-medical: Not on file  Tobacco Use  . Smoking status: Never Smoker  . Smokeless tobacco: Never Used  Substance and Sexual Activity  . Alcohol use: No  . Drug use: No  . Sexual activity: Never    Birth control/protection: Pill  Lifestyle  . Physical activity    Days per week: Not on file    Minutes per session: Not on file  . Stress: Not on file  Relationships  . Social Herbalist on phone: Not on file    Gets together: Not on file    Attends religious service: Not on file    Active member of club or organization: Not on file    Attends meetings of clubs or organizations: Not on  file    Relationship status: Not on file  . Intimate partner violence    Fear of current or ex partner: Not on file    Emotionally abused: Not on file    Physically abused: Not on file    Forced sexual activity: Not on file  Other Topics Concern  . Not on file  Social History Narrative   [place].     Allergies  Allergen Reactions  . Codeine Other (See Comments)    Hallucinations and fever. Pt CAN take Vicodin and Percocet.    Family History  Problem Relation Age of Onset  . Dilated cardiomyopathy Mother        with ICD  . Heart disease Mother   . Atrial fibrillation Mother   . Lung cancer Maternal Grandfather        black lung  . Cancer Maternal Grandfather   . Diabetes type II Father   . Cancer Father   . Kidney failure Father   . Dementia Maternal Grandmother   .  Cancer Maternal Grandmother   . Stroke Maternal Grandmother   . Breast cancer Other   . Healthy Brother   . Healthy Child        x3    Prior to Admission medications   Medication Sig Start Date End Date Taking? Authorizing Provider  ARIPiprazole (ABILIFY) 10 MG tablet Take 10 mg by mouth at bedtime.    [provider]  aspirin EC 81 MG EC tablet Take 1 tablet (81 mg total) by mouth daily. 05/24/19   Dorcas CarrowGhimire, Kuber, MD  doxycycline (VIBRAMYCIN) 100 MG capsule Take 1 capsule (100 mg total) by mouth 2 (two) times daily. One po bid x 7 days 09/08/19   Bethann BerkshireZammit, Joseph, MD  DULoxetine (CYMBALTA) 60 MG capsule Take 60 mg by mouth at bedtime.     [provider]  furosemide (LASIX) 20 MG tablet Take 20 mg by mouth daily.    [provider]  naproxen (NAPROSYN) 500 MG tablet Take 1 tablet (500 mg total) by mouth 2 (two) times daily. Patient taking differently: Take 500 mg by mouth 2 (two) times daily as needed for mild pain.  09/09/17   Kellie ShropshireShrosbree, Emily J, PA-C  OLANZapine (ZYPREXA) 5 MG tablet Take 5 mg by mouth at bedtime.    [provider]  topiramate (TOPAMAX) 100 MG tablet Take 100 mg by mouth at bedtime.     [provider]    Physical Exam: Vitals:   09/10/19 1254  BP: 131/72  Pulse: (!) 107  Resp: 18  Temp: 97.8 F (36.6 C)  TempSrc: Oral  SpO2: 96%     . General:  Appears calm and comfortable and is NAD, mildly ill-appearing . Eyes:  PERRL, EOMI, normal lids, iris . ENT:  grossly normal hearing, lips & tongue, mmm . Neck:  no LAD, masses or thyromegaly . Cardiovascular:  RR with mild tachycardia, no m/r/g. No LE edema.  Marland Kitchen. Respiratory:   CTA bilaterally with no wheezes/rales/rhonchi.  Normal respiratory effort. . Abdomen:  soft, NT, ND, NABS . Skin:  no rash or induration seen on limited exam . Musculoskeletal:  grossly normal tone BUE/BLE, good ROM, no bony abnormality . Psychiatric:  grossly normal mood and affect, speech fluent  and appropriate, AOx3 . Neurologic:  CN 2-12 grossly intact, moves all extremities in coordinated fashion, sensation intact    Radiological Exams on Admission: Dg Chest Port 1 View  Result Date: 09/10/2019  CLINICAL DATA:  53 year old female with shortness of breath and vomiting. Tested positive for COVID-19. Yesterday. EXAM: PORTABLE CHEST 1 VIEW COMPARISON:  Portable chest 09/08/2019 and earlier. FINDINGS: Portable AP upright view at 1417 hours. Stable somewhat low lung volumes. Mediastinal contours remain normal. Visualized tracheal air column is within normal limits. Mild patchy and indistinct mid and lower lung opacities, mildly progressed from 09/08/2019. No consolidation, pneumothorax, pulmonary edema or pleural effusion. Negative visible bowel gas pattern. No acute osseous abnormality identified. IMPRESSION: Mild progression from 09/08/2019 of patchy and indistinct mid and lower lung opacities compatible with COVID-19 pneumonia. Electronically Signed   By: Odessa Fleming M.D.   On: 09/10/2019 14:25    EKG: Independently reviewed.  Sinus tachycardia with rate 103; nonspecific ST changes with no evidence of acute ischemia   Labs on Admission: I have personally reviewed the available labs and imaging studies at the time of the admission.  Pertinent labs:   CO2 19 Glucose 110 AP 198 Albumin 3.4 AST 119/ALT 102; 76/67 on 11/1 Normal CBC COVID POSITIVE on 11/1 LDH 332 HS troponin 4 D-dimer 0.83 Fibrinogen 577   Assessment/Plan Principal Problem:   Acute respiratory disease due to COVID-19 virus Active Problems:   Bipolar disorder (HCC)   Hyperlipidemia   Obesity, Class III, BMI 40-49.9 (morbid obesity) (HCC)   Acute respiratory failure with hypoxia -Patient with prior + COVID test on 11/1 presenting with worsening SOB, cough, fever -Anorexia with n/v -She does not have a usual home O2 requirement and is not currently requiring  O2, but her CXR appears to have worsened somewhat  since prior hospitalization -COVID POSITIVE -The patient has comorbidities which may increase the risk for ARDS/MODS including:  Morbid obesity -Pertinent labs concerning for COVID include normal WBC count; increased LFTs (worse than on 11/1); elevated D-dimer (not >1);  elevated troponin; increased LDH; increased fibrinogen -Procalcitonin, ferritin, and CRP are pending -CXR with multifocal opacities which may be c/w COVID vs. Multifocal PNA -Will treat with broad-spectrum antibiotics pending procalcitonin -Will admit to [facility] for further evaluation, close monitoring, and treatment -At this time, will attempt to avoid use of aerosolized medications and use HFAs instead -Will check daily labs including BMP with Mag, Phos; LFTs; CBC with differential; CRP; ferritin; fibrinogen; D-dimer  -Will order steroids (1 mg/kg divided BID) and Remdesivir (pharmacy consult) given +COVID test, +CXR, and hypoxia <94% on room air -If the patient shows clinical deterioration, consider transfer to ICU with PCCM consultation -Will attempt to maintain euvolemia to a net negative fluid status -Will ask the patient to maintain an awake prone position for 16+ hours a day, if possible, with a minimum of 2-3 hours at a time -With D-dimer <5, will use standard-dosed Lovenox for DVT prevention -Patient was seen wearing full PPE including: gown, gloves, head cover, N95, and face shield; donning and doffing was in compliance with current standards.  Morbid obesity -BMI 48 -Weight loss should be encouraged -Outpatient PCP/bariatric medicine/bariatric surgery f/u encouraged  HLD -She does not appear to be taking medication for this issue at this time  Bipolar d/o -Continue home meds including Abilify; Cymbalta; and Topamax -Home Zyprexa has been held, as multiple atypical antipsychotics may increase the risk of serious adverse outcomes     DVT prophylaxis:  Lovenox  Code Status:  Full - confirmed with patient Family  Communication: None present; she declined having me speak with her husband Disposition Plan:  Home once clinically improved Consults called: None  Admission status: Admit -  It is my clinical opinion that admission to INPATIENT is reasonable and necessary because of the expectation that this patient will require hospital care that crosses at least 2 midnights to treat this condition based on the medical complexity of the problems presented.  Given the aforementioned information, the predictability of an adverse outcome is felt to be significant.     Jonah Blue MD [facility]   How to contact the [facility] Attending or Consulting provider 7A - 7P or covering provider during after hours 7P -7A, for this patient?  1. Check the care team in [facility] and look for a) attending/consulting [facility] provider listed and b) the [facility] team listed 2. Log into [email or web address] and use [facility]'s universal password to access. If you do not have the password, please contact the hospital operator. 3. Locate the [facility] provider you are looking for under [facility] and page to a number that you can be directly reached. 4. If you still have difficulty reaching the provider, please page the [facility] (Director on Call) for the Hospitalists listed on amion for assistance.   09/10/2019, 5:46 PM

## 2019-09-10 NOTE — ED Triage Notes (Signed)
Patient c/o shortness of breath and about 5 episodes of emesis today. Patient states positive Covid test yesterday. C/o sore throat from cough along with body aches.

## 2019-09-10 NOTE — Progress Notes (Signed)
Patient arrived to the unit via [facility], patient able to ambulate from stretcher to unit bed independently. VSS, O2 saturations adequate, no pain expressed at this time; however, patient does have some nausea; antiemetic administered. Patient oriented to room and personal items placed at bedside. Patient resting comfortably at this time

## 2019-09-10 NOTE — Progress Notes (Signed)
Pharmacy Note - Remdesivir Dosing  O:  ALT: 102  CXR: mild and lower lung opacities compatible with COVID-19 pneumonia  Requiring supplemental O2: RA   A: 53 yo female positive for COVID-19 on 09/08/2019. Patient meets criteria for remdesivir.   P:  Begin remdesivir 200 mg IV x 1, followed by 100 mg IV daily x 4 days  Monitor ALT, clinical progress  Cristela Felt, PharmD PGY1 Pharmacy Resident [employer]: [phone]  09/10/2019 6:12 PM

## 2019-09-10 NOTE — ED Provider Notes (Addendum)
[facility] Provider Note   CSN: [id number] Arrival date & time: 09/10/19  1239     History   Chief Complaint Chief Complaint  Patient presents with  . Shortness of Breath    HPI Alexandra FilesVictoria L Berthelot is a 53 y.o. female.     53 year old female presents with emesis x24 hours.  Recently diagnosed with COVID-19 and has been on medications for this.  Patient states that she has had a slight cough with no severe dyspnea on exertion.  Nonbilious emesis without diarrhea.  No abdominal comfort.  Has noted some chest burning that which causes her to have emesis when she coughs.  Called her doctor and was told to come here     Past Medical History:  Diagnosis Date  . Bipolar 2 disorder (HCC)   . GERD (gastroesophageal reflux disease)   . H/O chest pain    dx'd with GERD  . History of syncope 2002   Vasovagal  . Hyperlipidemia   . Migraines   . Potassium (K) deficiency    per pt K+ deficiency - ? from meds she takes ( none of which are diuretucs)  . SVD (spontaneous vaginal delivery)    x 2    Patient Active Problem List   Diagnosis Date Noted  . Chest pain at rest 05/22/2019  . Attention deficit hyperactivity disorder (ADHD), predominantly inattentive type 09/18/2017  . Depression 01/18/2016  . Syncope 01/18/2016  . Hyperlipidemia   . GERD (gastroesophageal reflux disease)   . "walking corpse" syndrome   . Alteration of consciousness   . Symptomatic cholelithiasis 07/26/2012  . Bipolar disorder (HCC) 07/26/2012  . Chest pain 12/12/2011  . Hypotension 12/12/2011    Past Surgical History:  Procedure Laterality Date  . CESAREAN SECTION     x 1  . CHOLECYSTECTOMY  07/27/2012   Procedure: LAPAROSCOPIC CHOLECYSTECTOMY;  Surgeon: Shelly Rubensteinouglas A Blackman, MD;  Location: [facility];  Service: General;  Laterality: N/A;  . TUBAL LIGATION     x 2  . WISDOM TOOTH EXTRACTION       OB History   No obstetric history on file.      Home  Medications    Prior to Admission medications   Medication Sig Start Date End Date Taking? Authorizing Provider  ARIPiprazole (ABILIFY) 10 MG tablet Take 10 mg by mouth at bedtime.    [provider]  aspirin EC 81 MG EC tablet Take 1 tablet (81 mg total) by mouth daily. 05/24/19   Dorcas CarrowGhimire, Kuber, MD  doxycycline (VIBRAMYCIN) 100 MG capsule Take 1 capsule (100 mg total) by mouth 2 (two) times daily. One po bid x 7 days 09/08/19   Bethann BerkshireZammit, Joseph, MD  DULoxetine (CYMBALTA) 60 MG capsule Take 60 mg by mouth at bedtime.     [provider]  furosemide (LASIX) 20 MG tablet Take 20 mg by mouth daily.    [provider]  naproxen (NAPROSYN) 500 MG tablet Take 1 tablet (500 mg total) by mouth 2 (two) times daily. Patient taking differently: Take 500 mg by mouth 2 (two) times daily as needed for mild pain.  09/09/17   Kellie ShropshireShrosbree, Emily J, PA-C  OLANZapine (ZYPREXA) 5 MG tablet Take 5 mg by mouth at bedtime.    [provider]  topiramate (TOPAMAX) 100 MG tablet Take 100 mg by mouth at bedtime.     [provider]    Family History Family History  Problem Relation Age of Onset  .  Dilated cardiomyopathy Mother        with ICD  . Heart disease Mother   . Atrial fibrillation Mother   . Lung cancer Maternal Grandfather        black lung  . Cancer Maternal Grandfather   . Diabetes type II Father   . Cancer Father   . Kidney failure Father   . Dementia Maternal Grandmother   . Cancer Maternal Grandmother   . Stroke Maternal Grandmother   . Breast cancer Other   . Healthy Brother   . Healthy Child        x3    Social History Social History   Tobacco Use  . Smoking status: Never Smoker  . Smokeless tobacco: Never Used  Substance Use Topics  . Alcohol use: No  . Drug use: No     Allergies   Codeine   Review of Systems Review of Systems  All other systems reviewed and are negative.    Physical Exam Updated Vital Signs BP 131/72    Pulse (!) 107   Temp 97.8 F (36.6 C) (Oral)   Resp 18   LMP 08/18/2012   SpO2 96%   Physical Exam Vitals signs and nursing note reviewed.  Constitutional:      General: She is not in acute distress.    Appearance: Normal appearance. She is well-developed. She is not toxic-appearing.  HENT:     Head: Normocephalic and atraumatic.  Eyes:     General: Lids are normal.     Conjunctiva/sclera: Conjunctivae normal.     Pupils: Pupils are equal, round, and reactive to light.  Neck:     Musculoskeletal: Normal range of motion and neck supple.     Thyroid: No thyroid mass.     Trachea: No tracheal deviation.  Cardiovascular:     Rate and Rhythm: Normal rate and regular rhythm.     Heart sounds: Normal heart sounds. No murmur. No gallop.   Pulmonary:     Effort: Pulmonary effort is normal. No respiratory distress.     Breath sounds: Normal breath sounds. No stridor. No decreased breath sounds, wheezing, rhonchi or rales.  Abdominal:     General: Bowel sounds are normal. There is no distension.     Palpations: Abdomen is soft.     Tenderness: There is no abdominal tenderness. There is no rebound.  Musculoskeletal: Normal range of motion.        General: No tenderness.  Skin:    General: Skin is warm and dry.     Findings: No abrasion or rash.  Neurological:     Mental Status: She is alert and oriented to person, place, and time.     GCS: GCS eye subscore is 4. GCS verbal subscore is 5. GCS motor subscore is 6.     Cranial Nerves: No cranial nerve deficit.     Sensory: No sensory deficit.  Psychiatric:        Speech: Speech normal.        Behavior: Behavior normal.      ED Treatments / Results  Labs (all labs ordered are listed, but only abnormal results are displayed) Labs Reviewed  CBC WITH DIFFERENTIAL/PLATELET  COMPREHENSIVE METABOLIC PANEL  LIPASE, BLOOD    EKG None  Radiology Dg Chest Portable 1 View  Result Date: 09/08/2019 CLINICAL DATA:  Productive cough,  fever, and shortness of breath. EXAM: PORTABLE CHEST 1 VIEW COMPARISON:  05/22/2019 FINDINGS: The heart size and mediastinal contours are within normal  limits. New mild streaky opacity seen in both lung bases, which may be due to atelectasis or early infiltrates. No evidence of pleural effusion. IMPRESSION: New mild bibasilar atelectasis versus early infiltrates. Electronically Signed   By: Danae Orleans M.D.   On: 09/08/2019 17:26    Procedures Procedures (including critical care time)  Medications Ordered in ED Medications  sodium chloride 0.9 % bolus 1,000 mL (has no administration in time range)  0.9 %  sodium chloride infusion (has no administration in time range)  metoCLOPramide (REGLAN) injection 5 mg (has no administration in time range)     Initial Impression / Assessment and Plan / ED Course  I have reviewed the triage vital signs and the nursing notes.  Pertinent labs & imaging results that were available during my care of the patient were reviewed by me and considered in my medical decision making (see chart for details).        Patient given IV fluids here but continues to feel weak and short of breath.  LFTs is worse as well as chest x-ray shows worsening infiltrates.  Discussed with Dr. Ophelia Charter who will admit patient to [facility]  Alexandra Stein was evaluated in Emergency Department on 09/10/2019 for the symptoms described in the history of present illness. She was evaluated in the context of the global COVID-19 pandemic, which necessitated consideration that the patient might be at risk for infection with the SARS-CoV-2 virus that causes COVID-19. Institutional protocols and algorithms that pertain to the evaluation of patients at risk for COVID-19 are in a state of rapid change based on information released by regulatory bodies including the CDC and federal and state organizations. These policies and algorithms were followed during the patient's care in the ED.    Final Clinical Impressions(s) / ED Diagnoses   Final diagnoses:  None    ED Discharge Orders    None       Lorre Nick, MD 09/10/19 1530    Lorre Nick, MD 09/10/19 1530

## 2019-09-11 ENCOUNTER — Inpatient Hospital Stay (HOSPITAL_COMMUNITY): Payer: BC Managed Care – PPO

## 2019-09-11 LAB — CBC WITH DIFFERENTIAL/PLATELET
Abs Immature Granulocytes: 0.01 10*3/uL (ref 0.00–0.07)
Basophils Absolute: 0 10*3/uL (ref 0.0–0.1)
Basophils Relative: 0 %
Eosinophils Absolute: 0 10*3/uL (ref 0.0–0.5)
Eosinophils Relative: 0 %
HCT: 39.2 % (ref 36.0–46.0)
Hemoglobin: 12.4 g/dL (ref 12.0–15.0)
Immature Granulocytes: 0 %
Lymphocytes Relative: 21 %
Lymphs Abs: 0.7 10*3/uL (ref 0.7–4.0)
MCH: 29.1 pg (ref 26.0–34.0)
MCHC: 31.6 g/dL (ref 30.0–36.0)
MCV: 92 fL (ref 80.0–100.0)
Monocytes Absolute: 0.1 10*3/uL (ref 0.1–1.0)
Monocytes Relative: 3 %
Neutro Abs: 2.5 10*3/uL (ref 1.7–7.7)
Neutrophils Relative %: 76 %
Platelets: 174 10*3/uL (ref 150–400)
RBC: 4.26 MIL/uL (ref 3.87–5.11)
RDW: 13.2 % (ref 11.5–15.5)
WBC: 3.3 10*3/uL — ABNORMAL LOW (ref 4.0–10.5)
nRBC: 0 % (ref 0.0–0.2)

## 2019-09-11 LAB — COMPREHENSIVE METABOLIC PANEL
ALT: 128 U/L — ABNORMAL HIGH (ref 0–44)
AST: 156 U/L — ABNORMAL HIGH (ref 15–41)
Albumin: 3.3 g/dL — ABNORMAL LOW (ref 3.5–5.0)
Alkaline Phosphatase: 192 U/L — ABNORMAL HIGH (ref 38–126)
Anion gap: 9 (ref 5–15)
BUN: 19 mg/dL (ref 6–20)
CO2: 20 mmol/L — ABNORMAL LOW (ref 22–32)
Calcium: 8.4 mg/dL — ABNORMAL LOW (ref 8.9–10.3)
Chloride: 112 mmol/L — ABNORMAL HIGH (ref 98–111)
Creatinine, Ser: 0.78 mg/dL (ref 0.44–1.00)
GFR calc Af Amer: >60 mL/min (ref >60)
GFR calc non Af Amer: >60 mL/min (ref >60)
Glucose, Bld: 150 mg/dL — ABNORMAL HIGH (ref 70–99)
Potassium: 4.1 mmol/L (ref 3.5–5.1)
Sodium: 141 mmol/L (ref 135–145)
Total Bilirubin: 0.6 mg/dL (ref 0.3–1.2)
Total Protein: 6.9 g/dL (ref 6.5–8.1)

## 2019-09-11 LAB — HEPATITIS PANEL, ACUTE
HCV Ab: NONREACTIVE
Hep A IgM: NONREACTIVE
Hep B C IgM: NONREACTIVE
Hepatitis B Surface Ag: NONREACTIVE

## 2019-09-11 LAB — FERRITIN: Ferritin: 206 ng/mL (ref 11–307)

## 2019-09-11 LAB — C-REACTIVE PROTEIN: CRP: 12.2 mg/dL — ABNORMAL HIGH (ref <1.0)

## 2019-09-11 LAB — PHOSPHORUS: Phosphorus: 2.6 mg/dL (ref 2.5–4.6)

## 2019-09-11 LAB — D-DIMER, QUANTITATIVE: D-Dimer, Quant: 0.83 ug/mL-FEU — ABNORMAL HIGH (ref 0.00–0.50)

## 2019-09-11 LAB — MAGNESIUM: Magnesium: 2 mg/dL (ref 1.7–2.4)

## 2019-09-11 MED ORDER — LACTATED RINGERS IV SOLN
INTRAVENOUS | Status: AC
  Administered 2019-09-11 – 2019-09-12 (×3): via INTRAVENOUS

## 2019-09-11 MED ORDER — DEXAMETHASONE SODIUM PHOSPHATE 10 MG/ML IJ SOLN
6.0000 mg | Freq: Every day | INTRAMUSCULAR | Status: DC
  Administered 2019-09-12 – 2019-09-13 (×2): 6 mg via INTRAVENOUS
  Filled 2019-09-11 (×2): qty 1

## 2019-09-11 MED ORDER — IOHEXOL 350 MG/ML SOLN
100.0000 mL | Freq: Once | INTRAVENOUS | Status: AC | PRN
  Administered 2019-09-11: 100 mL via INTRAVENOUS

## 2019-09-11 MED ORDER — GUAIFENESIN-DM 100-10 MG/5ML PO SYRP
5.0000 mL | ORAL_SOLUTION | ORAL | Status: DC | PRN
  Administered 2019-09-11 – 2019-09-13 (×7): 5 mL via ORAL
  Filled 2019-09-11 (×7): qty 10

## 2019-09-11 NOTE — Progress Notes (Signed)
   09/11/19 1200  Family/Significant Other Communication  Family/Significant Other Update Called (No answer)   

## 2019-09-11 NOTE — TOC Initial Note (Signed)
Transition of Care [facility]) - Initial/Assessment Note    Patient Details  Name: Alexandra Stein MRN: [id number] Date of Birth: 13-Nov-1965  [facility] CM/SW Contact:    Ninfa Meeker, RN Phone Number: 09/11/2019, 10:14 AM  Clinical Narrative:   Patient is a 53 y.o. female with medical history significant of bipolar d/o; HLD; and morbid obesity (BMI 48) presenting with worsening COVID symptoms.  She developed cough last Thursday and and was seen by PCP and started on azithromycin.  She presented to the ER on 11/1 with fever and cough; CXR showed possible infiltrate and so Azithro was changed to Doxy.   She was notified yesterday that her COVID test was positive, and has had progressive SOB, emesis, and myalgias. Patient has been started on IV steroids, Remdesivir. Case manager will continue to monitor for needs.            Patient Goals and CMS Choice        Expected Discharge Plan and Services: to be determined                                                Prior Living Arrangements/Services                       Activities of Daily Living Home Assistive Devices/Equipment: Eyeglasses ADL Screening (condition at time of admission) Patient's cognitive ability adequate to safely complete daily activities?: Yes Is the patient deaf or have difficulty hearing?: No Does the patient have difficulty seeing, even when wearing glasses/contacts?: No Does the patient have difficulty concentrating, remembering, or making decisions?: No Patient able to express need for assistance with ADLs?: Yes Does the patient have difficulty dressing or bathing?: No Independently performs ADLs?: Yes (appropriate for developmental age) Does the patient have difficulty walking or climbing stairs?: No Weakness of Legs: None Weakness of Arms/Hands: None  Permission Sought/Granted                  Emotional Assessment              Admission diagnosis:   COVID-19 [U07.1] Patient Active Problem List   Diagnosis Date Noted  . Acute respiratory disease due to COVID-19 virus 09/10/2019  . Obesity, Class III, BMI 40-49.9 (morbid obesity) ([place]) 09/10/2019  . Chest pain at rest 05/22/2019  . Attention deficit hyperactivity disorder (ADHD), predominantly inattentive type 09/18/2017  . Depression 01/18/2016  . Syncope 01/18/2016  . Hyperlipidemia   . GERD (gastroesophageal reflux disease)   . "walking corpse" syndrome   . Alteration of consciousness   . Symptomatic cholelithiasis 07/26/2012  . Bipolar disorder ([place]) 07/26/2012  . Chest pain 12/12/2011  . Hypotension 12/12/2011   PCP:  Maury Dus, MD Pharmacy:   [facility] [place], [address] [place] [address] Phone: [phone] Fax: [phone]  [facility] [place], [address] [place] [address] Phone: [phone] Fax: [phone]     Social Determinants of Health (SDOH) Interventions    Readmission Risk Interventions No flowsheet data found.

## 2019-09-11 NOTE — Progress Notes (Signed)
PROGRESS NOTE    [name]  ZOX:[id number] DOB: 29-May-1966 DOA: 09/10/2019 PCP: Elias Else, MD   Brief Narrative:  Alexandra Stein is Alexandra Stein 53 y.o. female with medical history significant of bipolar d/o; HLD; and morbid obesity (BMI 48) presenting with worsening COVID symptoms.  She developed cough last Thursday and and was seen by PCP and started on azithromycin.  She presented to the ER on 11/1 with fever and cough; CXR showed possible infiltrate and so Azithro was changed to Doxy.   She was notified yesterday that her COVID test was positive, and has had progressive SOB, emesis, and myalgias.  She called her PCP, who encouraged her to return to the ER.  She is uncertain how she acquired COVID but her husband and family members with whom she has had recent contact are all in quarantine and planning to be tested.   ED Course:  COVID - seen yesterday, weak, feels awful.  Mild SOB, no hypoxia, CXR with worsening PNA compared to yesterday.  Taking Doxy.   Assessment & Plan:   Principal Problem:   Acute respiratory disease due to COVID-19 virus Active Problems:   Bipolar disorder (HCC)   Hyperlipidemia   Obesity, Class III, BMI 40-49.9 (morbid obesity) (HCC)   Acute respiratory failure with hypoxia   COVID 19 Pneumonia -Patient with prior + COVID test on 11/1 presenting with worsening SOB, cough, fever -Anorexia with n/v - CT chest with diffuse patchy infiltrates in both lungs, enlarged mediastinal and hilar LN - Daily labs (CBC, CMP, D dimer, ferritin, CRP) - D/c abx with negative PCT - Continue dexamethasone, remdesivir (day 2) -Will attempt to maintain euvolemia to Latunya Kissick net negative fluid status -Prone as necessary  -With D-dimer <5, will use standard-dosed Lovenox for DVT prevention -Patient was seen wearing full PPE including: gown, gloves, head cover, N95, and face shield; donning and doffing was in compliance with current standards.  Elevated LFT's: likely 2/2  above Acute hepatitis panel Consider RUQ [place] if not improving  Lightheadedness: suspect 2/2 dehydration with poor PO intake due to above, follow with IVF  Morbid obesity -BMI 48 -Weight loss should be encouraged -Outpatient PCP/bariatric medicine/bariatric surgery f/u encouraged  HLD -She does not appear to be taking medication for this issue at this time  Bipolar d/o -Continue home meds including Abilify; Cymbalta; and Topamax -Home Zyprexa has been held, as multiple atypical antipsychotics may increase the risk of serious adverse outcomes  DVT prophylaxis: lovenox Code Status: full  Family Communication: none at bedside, poc discussed with pt Disposition Plan: pending further improvement, completion of remdesivir    Consultants:   none  Procedures:   none  Antimicrobials:  Anti-infectives (From admission, onward)   Start     Dose/Rate Route Frequency Ordered Stop   09/11/19 1600  remdesivir 100 mg in sodium chloride 0.9 % 250 mL IVPB     100 mg 500 mL/hr over 30 Minutes Intravenous Every 24 hours 09/10/19 1819 09/15/19 1559   09/10/19 1915  remdesivir 200 mg in sodium chloride 0.9 % 250 mL IVPB     200 mg 500 mL/hr over 30 Minutes Intravenous Once 09/10/19 1819 09/10/19 2024   09/10/19 1800  cefTRIAXone (ROCEPHIN) 1 g in sodium chloride 0.9 % 100 mL IVPB  Status:  Discontinued     1 g 200 mL/hr over 30 Minutes Intravenous Every 24 hours 09/10/19 1755 09/11/19 0852   09/10/19 1800  azithromycin (ZITHROMAX) 500 mg in sodium chloride 0.9 % 250  mL IVPB  Status:  Discontinued     500 mg 250 mL/hr over 60 Minutes Intravenous Every 24 hours 09/10/19 1755 09/11/19 0852         Subjective: C/o lightheadness, weakness Coughing spells  Objective: Vitals:   09/10/19 1945 09/10/19 2123 09/11/19 0423 09/11/19 0759  BP: (!) 149/87 132/81 100/72 118/80  Pulse: (!) 101 94 73 75  Resp: (!) 22  20 18   Temp:  98.2 F (36.8 C) 97.6 F (36.4 C) 97.6 F (36.4 C)   TempSrc:  Oral Oral Oral  SpO2: 99% 94%  92%  Weight:      Height:  5\' 2"  (1.575 m)      Intake/Output Summary (Last 24 hours) at 09/11/2019 1333 Last data filed at 09/11/2019 0926 Gross per 24 hour  Intake 1243 ml  Output --  Net 1243 ml   Filed Weights   09/10/19 1900  Weight: 120 kg    Examination:  General exam: Appears calm and comfortable  Respiratory system: unlabored, no increased WOB or accessory muscle use Cardiovascular system: RRR. Gastrointestinal system: Abdomen is nondistended, soft and nontender Central nervous system: Alert and oriented. No focal neurological deficits. Extremities: Symmetric 5 x 5 power. Skin: No rashes, lesions or ulcers Psychiatry: Judgement and insight appear normal. Mood & affect appropriate.     Data Reviewed: I have personally reviewed following labs and imaging studies  CBC: Recent Labs  Lab 09/08/19 1558 09/10/19 1425 09/11/19 0600  WBC 4.1 4.2 3.3*  NEUTROABS 3.0 2.9 2.5  HGB 13.3 13.2 12.4  HCT 41.0 41.2 39.2  MCV 91.1 91.4 92.0  PLT 163 177 174   Basic Metabolic Panel: Recent Labs  Lab 09/08/19 1558 09/10/19 1425 09/11/19 0600  NA 139 140 141  K 3.9 3.6 4.1  CL 109 110 112*  CO2 20* 19* 20*  GLUCOSE 103* 110* 150*  BUN 12 17 19   CREATININE 0.89 0.96 0.78  CALCIUM 8.8* 8.5* 8.4*  MG  --   --  2.0  PHOS  --   --  2.6   GFR: Estimated Creatinine Clearance: 101.4 mL/min (by C-G formula based on SCr of 0.78 mg/dL). Liver Function Tests: Recent Labs  Lab 09/08/19 1558 09/10/19 1425 09/11/19 0600  AST 76* 119* 156*  ALT 67* 102* 128*  ALKPHOS 140* 198* 192*  BILITOT 0.8 0.5 0.6  PROT 6.9 6.7 6.9  ALBUMIN 3.6 3.4* 3.3*   Recent Labs  Lab 09/10/19 1425  LIPASE 32   No results for input(s): AMMONIA in the last 168 hours. Coagulation Profile: No results for input(s): INR, PROTIME in the last 168 hours. Cardiac Enzymes: No results for input(s): CKTOTAL, CKMB, CKMBINDEX, TROPONINI in the last 168  hours. BNP (last 3 results) No results for input(s): PROBNP in the last 8760 hours. HbA1C: No results for input(s): HGBA1C in the last 72 hours. CBG: No results for input(s): GLUCAP in the last 168 hours. Lipid Profile: No results for input(s): CHOL, HDL, LDLCALC, TRIG, CHOLHDL, LDLDIRECT in the last 72 hours. Thyroid Function Tests: No results for input(s): TSH, T4TOTAL, FREET4, T3FREE, THYROIDAB in the last 72 hours. Anemia Panel: Recent Labs    09/10/19 1649 09/11/19 0600  FERRITIN 184 206   Sepsis Labs: Recent Labs  Lab 09/08/19 1558 09/10/19 1649  PROCALCITON  --  <0.10  LATICACIDVEN 1.0  --     Recent Results (from the past 240 hour(s))  SARS CORONAVIRUS 2 (TAT 6-24 HRS) Nasopharyngeal Nasopharyngeal Swab  Status: Abnormal   Collection Time: 09/08/19  5:26 PM   Specimen: Nasopharyngeal Swab  Result Value Ref Range Status   SARS Coronavirus 2 POSITIVE (Shadell Brenn) NEGATIVE Final    Comment: EMAILED TO LORI BERDICK AT 2002 09/09/2019 MCCORMICK K (NOTE) SARS-CoV-2 target nucleic acids are DETECTED. The SARS-CoV-2 RNA is generally detectable in upper and lower respiratory specimens during the acute phase of infection. Positive results are indicative of active infection with SARS-CoV-2. Clinical  correlation with patient history and other diagnostic information is necessary to determine patient infection status. Positive results do  not rule out bacterial infection or co-infection with other viruses. The expected result is Negative. Fact Sheet for Patients: HairSlick.no Fact Sheet for Healthcare Providers: quierodirigir.com This test is not yet approved or cleared by the [place] FDA and  has been authorized for detection and/or diagnosis of SARS-CoV-2 by FDA under an Emergency Use Authorization (EUA). This EUA will remain  in effect (meaning this test can be used) for the duration of the COVID-19 dec laration  under Section 564(b)(1) of the Act, 21 U.S.C. section 360bbb-3(b)(1), unless the authorization is terminated or revoked sooner. Performed at [facility], [address] [place], [place] [address]          Radiology Studies: Ct Angio Chest Pe W Or Wo Contrast  Result Date: 09/11/2019 CLINICAL DATA:  Midsternal chest pain and shortness of breath. EXAM: CT ANGIOGRAPHY CHEST WITH CONTRAST TECHNIQUE: Multidetector CT imaging of the chest was performed using the standard protocol during bolus administration of intravenous contrast. Multiplanar CT image reconstructions and MIPs were obtained to evaluate the vascular anatomy. CONTRAST:  OMNIPAQUE IOHEXOL 350 MG/ML SOLN COMPARISON:  10/25/2015 FINDINGS: Cardiovascular: Borderline heart size. No pericardial effusion. The aorta is normal in caliber. No dissection. No atherosclerotic calcifications. The branch vessels are patent. No definite coronary artery calcifications. The pulmonary arterial tree is fairly well opacified. No filling defects to suggest pulmonary embolism. Mediastinum/Nodes: Borderline enlarged mediastinal and hilar lymph nodes, likely inflammatory. 12 mm right paratracheal node on image 37/4. 14 mm right hilar node on image 56/4. 10.5 mm subcarinal node on image 60/for. The esophagus is grossly normal. Lungs/Pleura: Diffuse patchy airspace process in both lungs likely reflecting diffuse atypical infiltrates. No pleural effusion. No worrisome pulmonary lesions. Upper Abdomen: No significant upper abdominal findings. Musculoskeletal: No significant bony findings. Review of the MIP images confirms the above findings. IMPRESSION: 1. No CT findings for acute pulmonary embolism. 2. Normal thoracic aorta. 3. Borderline enlarged mediastinal and hilar lymph nodes, likely inflammatory/hyperplastic. 4. Diffuse patchy infiltrates in both lungs most likely diffuse atypical infection. Recommend correlation with COVID status. Electronically  Signed   By: Rudie Meyer M.D.   On: 09/11/2019 12:01   Dg Chest Port 1 View  Result Date: 09/10/2019 CLINICAL DATA:  53 year old female with shortness of breath and vomiting. Tested positive for COVID-19. Yesterday. EXAM: PORTABLE CHEST 1 VIEW COMPARISON:  Portable chest 09/08/2019 and earlier. FINDINGS: Portable AP upright view at 1417 hours. Stable somewhat low lung volumes. Mediastinal contours remain normal. Visualized tracheal air column is within normal limits. Mild patchy and indistinct mid and lower lung opacities, mildly progressed from 09/08/2019. No consolidation, pneumothorax, pulmonary edema or pleural effusion. Negative visible bowel gas pattern. No acute osseous abnormality identified. IMPRESSION: Mild progression from 09/08/2019 of patchy and indistinct mid and lower lung opacities compatible with COVID-19 pneumonia. Electronically Signed   By: Odessa Fleming M.D.   On: 09/10/2019 14:25  Scheduled Meds:  ARIPiprazole  15 mg Oral QHS   aspirin  81 mg Oral Daily   docusate sodium  100 mg Oral BID   DULoxetine  60 mg Oral QHS   enoxaparin (LOVENOX) injection  0.5 mg/kg Subcutaneous Q24H   methylPREDNISolone (SOLU-MEDROL) injection  0.5 mg/kg Intravenous Q12H   pantoprazole  40 mg Oral Daily   sodium chloride flush  3 mL Intravenous Q12H   topiramate  100 mg Oral QHS   Continuous Infusions:  sodium chloride     remdesivir 100 mg in NS 250 mL       LOS: 1 day    Time spent: over 30 min    Fayrene Helper, MD [facility] Pager AMION  If 7PM-7AM, please contact night-coverage [email or web address] Password [email or web address] 09/11/2019, 1:33 PM

## 2019-09-11 NOTE — Plan of Care (Signed)
  Problem: Education: Goal: Knowledge of risk factors and measures for prevention of condition will improve Outcome: Progressing   Problem: Coping: Goal: Psychosocial and spiritual needs will be supported Outcome: Progressing   Problem: Respiratory: Goal: Will maintain a patent airway Outcome: Progressing Goal: Complications related to the disease process, condition or treatment will be avoided or minimized Outcome: Progressing   

## 2019-09-12 ENCOUNTER — Inpatient Hospital Stay (HOSPITAL_COMMUNITY): Payer: BC Managed Care – PPO

## 2019-09-12 LAB — CBC WITH DIFFERENTIAL/PLATELET
Abs Immature Granulocytes: 0.03 10*3/uL (ref 0.00–0.07)
Basophils Absolute: 0 10*3/uL (ref 0.0–0.1)
Basophils Relative: 0 %
Eosinophils Absolute: 0 10*3/uL (ref 0.0–0.5)
Eosinophils Relative: 0 %
HCT: 36.6 % (ref 36.0–46.0)
Hemoglobin: 11.5 g/dL — ABNORMAL LOW (ref 12.0–15.0)
Immature Granulocytes: 0 %
Lymphocytes Relative: 10 %
Lymphs Abs: 0.7 10*3/uL (ref 0.7–4.0)
MCH: 28.8 pg (ref 26.0–34.0)
MCHC: 31.4 g/dL (ref 30.0–36.0)
MCV: 91.5 fL (ref 80.0–100.0)
Monocytes Absolute: 0.3 10*3/uL (ref 0.1–1.0)
Monocytes Relative: 4 %
Neutro Abs: 5.8 10*3/uL (ref 1.7–7.7)
Neutrophils Relative %: 86 %
Platelets: 184 10*3/uL (ref 150–400)
RBC: 4 MIL/uL (ref 3.87–5.11)
RDW: 13.2 % (ref 11.5–15.5)
WBC: 6.9 10*3/uL (ref 4.0–10.5)
nRBC: 0 % (ref 0.0–0.2)

## 2019-09-12 LAB — COMPREHENSIVE METABOLIC PANEL
ALT: 113 U/L — ABNORMAL HIGH (ref 0–44)
AST: 81 U/L — ABNORMAL HIGH (ref 15–41)
Albumin: 3.1 g/dL — ABNORMAL LOW (ref 3.5–5.0)
Alkaline Phosphatase: 163 U/L — ABNORMAL HIGH (ref 38–126)
Anion gap: 9 (ref 5–15)
BUN: 21 mg/dL — ABNORMAL HIGH (ref 6–20)
CO2: 19 mmol/L — ABNORMAL LOW (ref 22–32)
Calcium: 8.8 mg/dL — ABNORMAL LOW (ref 8.9–10.3)
Chloride: 112 mmol/L — ABNORMAL HIGH (ref 98–111)
Creatinine, Ser: 0.72 mg/dL (ref 0.44–1.00)
GFR calc Af Amer: >60 mL/min (ref >60)
GFR calc non Af Amer: >60 mL/min (ref >60)
Glucose, Bld: 167 mg/dL — ABNORMAL HIGH (ref 70–99)
Potassium: 3.8 mmol/L (ref 3.5–5.1)
Sodium: 140 mmol/L (ref 135–145)
Total Bilirubin: 0.7 mg/dL (ref 0.3–1.2)
Total Protein: 6.4 g/dL — ABNORMAL LOW (ref 6.5–8.1)

## 2019-09-12 LAB — PHOSPHORUS: Phosphorus: 2.5 mg/dL (ref 2.5–4.6)

## 2019-09-12 LAB — MAGNESIUM: Magnesium: 1.9 mg/dL (ref 1.7–2.4)

## 2019-09-12 LAB — D-DIMER, QUANTITATIVE: D-Dimer, Quant: 0.63 ug/mL-FEU — ABNORMAL HIGH (ref 0.00–0.50)

## 2019-09-12 LAB — C-REACTIVE PROTEIN: CRP: 8.2 mg/dL — ABNORMAL HIGH (ref <1.0)

## 2019-09-12 LAB — FERRITIN: Ferritin: 170 ng/mL (ref 11–307)

## 2019-09-12 MED ORDER — LACTATED RINGERS IV SOLN
INTRAVENOUS | Status: DC
  Administered 2019-09-12 – 2019-09-13 (×2): via INTRAVENOUS

## 2019-09-12 NOTE — Progress Notes (Signed)
PROGRESS NOTE    JACKALYNN ART  RZN:[id number] DOB: [birth year] DOA: 09/10/2019 PCP: Maury Dus, MD   Brief Narrative:  Alexandra Stein is Alexandra Stein 53 y.o. female with medical history significant of bipolar d/o; HLD; and morbid obesity (BMI 48) presenting with worsening COVID symptoms.  She developed cough last Thursday and and was seen by PCP and started on azithromycin.  She presented to the ER on 11/1 with fever and cough; CXR showed possible infiltrate and so Azithro was changed to Doxy.   She was notified yesterday that her COVID test was positive, and has had progressive SOB, emesis, and myalgias.  She called her PCP, who encouraged her to return to the ER.  She is uncertain how she acquired COVID but her husband and family members with whom she has had recent contact are all in quarantine and planning to be tested.   ED Course:  COVID - seen yesterday, weak, feels awful.  Mild SOB, no hypoxia, CXR with worsening PNA compared to yesterday.  Taking Doxy.   Assessment & Plan:   Principal Problem:   Acute respiratory disease due to COVID-19 virus Active Problems:   Bipolar disorder ([place])   Hyperlipidemia   Obesity, Class III, BMI 40-49.9 (morbid obesity) ([place])   Acute respiratory failure with hypoxia   COVID 19 Pneumonia -Patient with prior + COVID test on 11/1 presenting with worsening SOB, cough, fever -Anorexia with n/v - CT chest with diffuse patchy infiltrates in both lungs, enlarged mediastinal and hilar LN - Daily labs (CBC, CMP, D dimer, ferritin, CRP) - elevated, but improving - D/c abx with negative PCT - Continue dexamethasone, remdesivir (day 3) -Will attempt to maintain euvolemia to [place] net negative fluid status -Prone as necessary  -With D-dimer <5, will use standard-dosed Lovenox for DVT prevention -Patient was seen wearing full PPE including: gown, gloves, head cover, N95, and face shield; donning and doffing was in compliance with current standards.  COVID-19  Labs  Recent Labs    09/10/19 1649 09/11/19 0600 09/12/19 0035  DDIMER 0.83* 0.83* 0.63*  FERRITIN 184 206 170  LDH 332*  --   --   CRP 11.5* 12.2* 8.2*    Lab Results  Component Value Date   SARSCOV2NAA POSITIVE (Janique Hoefer) 09/08/2019   [place] NEGATIVE 05/22/2019    Elevated LFT's: likely 2/2 above Acute hepatitis panel - negative Consider RUQ [place] if not improving Improving  Lightheadedness: improving, continue IVF   Morbid obesity -BMI 48 -Weight loss should be encouraged -Outpatient PCP/bariatric medicine/bariatric surgery f/u encouraged  HLD -She does not appear to be taking medication for this issue at this time  Bipolar d/o -Continue home meds including Abilify; Cymbalta; and Topamax -Home Zyprexa has been held, as multiple atypical antipsychotics may increase the risk of serious adverse outcomes  DVT prophylaxis: lovenox Code Status: full  Family Communication: none at bedside, poc discussed with pt Disposition Plan: pending further improvement, completion of remdesivir    Consultants:   none  Procedures:   none  Antimicrobials:  Anti-infectives (From admission, onward)   Start     Dose/Rate Route Frequency Ordered Stop   09/11/19 1600  remdesivir 100 mg in sodium chloride 0.9 % 250 mL IVPB     100 mg 500 mL/hr over 30 Minutes Intravenous Every 24 hours 09/10/19 1819 09/15/19 1559   09/10/19 1915  remdesivir 200 mg in sodium chloride 0.9 % 250 mL IVPB     200 mg 500 mL/hr over 30 Minutes Intravenous Once  09/10/19 1819 09/10/19 2024   09/10/19 1800  cefTRIAXone (ROCEPHIN) 1 g in sodium chloride 0.9 % 100 mL IVPB  Status:  Discontinued     1 g 200 mL/hr over 30 Minutes Intravenous Every 24 hours 09/10/19 1755 09/11/19 0852   09/10/19 1800  azithromycin (ZITHROMAX) 500 mg in sodium chloride 0.9 % 250 mL IVPB  Status:  Discontinued     500 mg 250 mL/hr over 60 Minutes Intravenous Every 24 hours 09/10/19 1755 09/11/19 0852          Subjective: Feels better today, still not at baseline Still with poor appetite Denies SOB at this time  Objective: Vitals:   09/11/19 1937 09/12/19 0459 09/12/19 0752 09/12/19 1352  BP: 116/68 124/85 121/86 116/78  Pulse: 78 69 88 86  Resp: 17  19 20   Temp: 98.1 F (36.7 C) 97.9 F (36.6 C) 97.7 F (36.5 C) (!) 97.5 F (36.4 C)  TempSrc: Oral Oral Oral Oral  SpO2: 93% 93% 94% 95%  Weight:      Height:        Intake/Output Summary (Last 24 hours) at 09/12/2019 1704 Last data filed at 09/12/2019 0804 Gross per 24 hour  Intake 3 ml  Output --  Net 3 ml   Filed Weights   09/10/19 1900  Weight: 120 kg    Examination:  General: No acute distress. Cardiovascular: RRR Lungs: unlabored, no accessory muscle use Abdomen: Soft, nontender, nondistended  Neurological: Alert and oriented 3. Moves all extremities 4. Cranial nerves II through XII grossly intact. Skin: Warm and dry. No rashes or lesions. Extremities: No clubbing or cyanosis. No edema.  Data Reviewed: I have personally reviewed following labs and imaging studies  CBC: Recent Labs  Lab 09/08/19 1558 09/10/19 1425 09/11/19 0600 09/12/19 0035  WBC 4.1 4.2 3.3* 6.9  NEUTROABS 3.0 2.9 2.5 5.8  HGB 13.3 13.2 12.4 11.5*  HCT 41.0 41.2 39.2 36.6  MCV 91.1 91.4 92.0 91.5  PLT 163 177 174 184   Basic Metabolic Panel: Recent Labs  Lab 09/08/19 1558 09/10/19 1425 09/11/19 0600 09/12/19 0035  NA 139 140 141 140  K 3.9 3.6 4.1 3.8  CL 109 110 112* 112*  CO2 20* 19* 20* 19*  GLUCOSE 103* 110* 150* 167*  BUN 12 17 19  21*  CREATININE 0.89 0.96 0.78 0.72  CALCIUM 8.8* 8.5* 8.4* 8.8*  MG  --   --  2.0 1.9  PHOS  --   --  2.6 2.5   GFR: Estimated Creatinine Clearance: 101.4 mL/min (by C-G formula based on SCr of 0.72 mg/dL). Liver Function Tests: Recent Labs  Lab 09/08/19 1558 09/10/19 1425 09/11/19 0600 09/12/19 0035  AST 76* 119* 156* 81*  ALT 67* 102* 128* 113*  ALKPHOS 140* 198* 192* 163*   BILITOT 0.8 0.5 0.6 0.7  PROT 6.9 6.7 6.9 6.4*  ALBUMIN 3.6 3.4* 3.3* 3.1*   Recent Labs  Lab 09/10/19 1425  LIPASE 32   No results for input(s): AMMONIA in the last 168 hours. Coagulation Profile: No results for input(s): INR, PROTIME in the last 168 hours. Cardiac Enzymes: No results for input(s): CKTOTAL, CKMB, CKMBINDEX, TROPONINI in the last 168 hours. BNP (last 3 results) No results for input(s): PROBNP in the last 8760 hours. HbA1C: No results for input(s): HGBA1C in the last 72 hours. CBG: No results for input(s): GLUCAP in the last 168 hours. Lipid Profile: No results for input(s): CHOL, HDL, LDLCALC, TRIG, CHOLHDL, LDLDIRECT in the last 72  hours. Thyroid Function Tests: No results for input(s): TSH, T4TOTAL, FREET4, T3FREE, THYROIDAB in the last 72 hours. Anemia Panel: Recent Labs    09/11/19 0600 09/12/19 0035  FERRITIN 206 170   Sepsis Labs: Recent Labs  Lab 09/08/19 1558 09/10/19 1649  PROCALCITON  --  <0.10  LATICACIDVEN 1.0  --     Recent Results (from the past 240 hour(s))  SARS CORONAVIRUS 2 (TAT 6-24 HRS) Nasopharyngeal Nasopharyngeal Swab     Status: Abnormal   Collection Time: 09/08/19  5:26 PM   Specimen: Nasopharyngeal Swab  Result Value Ref Range Status   SARS Coronavirus 2 POSITIVE (Nyeema Want) NEGATIVE Final    Comment: EMAILED TO LORI BERDICK AT 2002 09/09/2019 MCCORMICK K (NOTE) SARS-CoV-2 target nucleic acids are DETECTED. The SARS-CoV-2 RNA is generally detectable in upper and lower respiratory specimens during the acute phase of infection. Positive results are indicative of active infection with SARS-CoV-2. Clinical  correlation with patient history and other diagnostic information is necessary to determine patient infection status. Positive results do  not rule out bacterial infection or co-infection with other viruses. The expected result is Negative. Fact Sheet for Patients: HairSlick.no Fact Sheet for  Healthcare Providers: quierodirigir.com This test is not yet approved or cleared by the [place] FDA and  has been authorized for detection and/or diagnosis of SARS-CoV-2 by FDA under an Emergency Use Authorization (EUA). This EUA will remain  in effect (meaning this test can be used) for the duration of the COVID-19 dec laration under Section 564(b)(1) of the Act, 21 U.S.C. section 360bbb-3(b)(1), unless the authorization is terminated or revoked sooner. Performed at [facility], [address], [place], [place] [address]          Radiology Studies: Ct Angio Chest Pe W Or Wo Contrast  Result Date: 09/11/2019 CLINICAL DATA:  Midsternal chest pain and shortness of breath. EXAM: CT ANGIOGRAPHY CHEST WITH CONTRAST TECHNIQUE: Multidetector CT imaging of the chest was performed using the standard protocol during bolus administration of intravenous contrast. Multiplanar CT image reconstructions and MIPs were obtained to evaluate the vascular anatomy. CONTRAST:  OMNIPAQUE IOHEXOL 350 MG/ML SOLN COMPARISON:  10/25/2015 FINDINGS: Cardiovascular: Borderline heart size. No pericardial effusion. The aorta is normal in caliber. No dissection. No atherosclerotic calcifications. The branch vessels are patent. No definite coronary artery calcifications. The pulmonary arterial tree is fairly well opacified. No filling defects to suggest pulmonary embolism. Mediastinum/Nodes: Borderline enlarged mediastinal and hilar lymph nodes, likely inflammatory. 12 mm right paratracheal node on image 37/4. 14 mm right hilar node on image 56/4. 10.5 mm subcarinal node on image 60/for. The esophagus is grossly normal. Lungs/Pleura: Diffuse patchy airspace process in both lungs likely reflecting diffuse atypical infiltrates. No pleural effusion. No worrisome pulmonary lesions. Upper Abdomen: No significant upper abdominal findings. Musculoskeletal: No significant bony findings. Review  of the MIP images confirms the above findings. IMPRESSION: 1. No CT findings for acute pulmonary embolism. 2. Normal thoracic aorta. 3. Borderline enlarged mediastinal and hilar lymph nodes, likely inflammatory/hyperplastic. 4. Diffuse patchy infiltrates in both lungs most likely diffuse atypical infection. Recommend correlation with COVID status. Electronically Signed   By: Rudie Meyer M.D.   On: 09/11/2019 12:01        Scheduled Meds:  ARIPiprazole  15 mg Oral QHS   aspirin  81 mg Oral Daily   dexamethasone (DECADRON) injection  6 mg Intravenous Daily   docusate sodium  100 mg Oral BID   DULoxetine  60 mg Oral  QHS   enoxaparin (LOVENOX) injection  0.5 mg/kg Subcutaneous Q24H   pantoprazole  40 mg Oral Daily   sodium chloride flush  3 mL Intravenous Q12H   topiramate  100 mg Oral QHS   Continuous Infusions:  sodium chloride     remdesivir 100 mg in NS 250 mL 100 mg (09/12/19 1538)     LOS: 2 days    Time spent: over 30 min    Lacretia Nicksaldwell Powell, MD [facility] Pager AMION  If 7PM-7AM, please contact night-coverage [email or web address] Password [email or web address] 09/12/2019, 5:04 PM

## 2019-09-12 NOTE — Progress Notes (Signed)
SATURATION QUALIFICATIONS: (This note is used to comply with regulatory documentation for home oxygen)  Patient Saturations on Room Air at Rest = 95%  Patient Saturations on Room Air while Ambulating = 93%   

## 2019-09-12 NOTE — Progress Notes (Signed)
   09/12/19 1810  Vitals  Temp 99 F (37.2 C)  Temp Source Oral  BP 115/71  MAP (mmHg) 83  BP Location Left Arm  BP Method Automatic  Pulse Rate 81  Pulse Rate Source Dinamap  Level of Consciousness  Level of Consciousness Alert  Oxygen Therapy  SpO2 96 %  O2 Device Room Air  Complaints & Interventions  Complains of Shortness of breath;Coughing;Other (Comment) (weakness)  Interventions Other (comment) (paged MD)  MEWS Score  MEWS RR 0  MEWS Pulse 0  MEWS Systolic 0  MEWS LOC 0  MEWS Temp 0  MEWS Score 0  MEWS Score Color Green   Pt called out and asked for myself directly with complaints of the above symptoms. Upon assessment, the above VS were obtained. Will page MD and follow up with any further interventions

## 2019-09-13 LAB — COMPREHENSIVE METABOLIC PANEL
ALT: 101 U/L — ABNORMAL HIGH (ref 0–44)
AST: 61 U/L — ABNORMAL HIGH (ref 15–41)
Albumin: 2.8 g/dL — ABNORMAL LOW (ref 3.5–5.0)
Alkaline Phosphatase: 149 U/L — ABNORMAL HIGH (ref 38–126)
Anion gap: 12 (ref 5–15)
BUN: 21 mg/dL — ABNORMAL HIGH (ref 6–20)
CO2: 22 mmol/L (ref 22–32)
Calcium: 8.4 mg/dL — ABNORMAL LOW (ref 8.9–10.3)
Chloride: 110 mmol/L (ref 98–111)
Creatinine, Ser: 0.8 mg/dL (ref 0.44–1.00)
GFR calc Af Amer: >60 mL/min (ref >60)
GFR calc non Af Amer: >60 mL/min (ref >60)
Glucose, Bld: 134 mg/dL — ABNORMAL HIGH (ref 70–99)
Potassium: 3.5 mmol/L (ref 3.5–5.1)
Sodium: 144 mmol/L (ref 135–145)
Total Bilirubin: 0.7 mg/dL (ref 0.3–1.2)
Total Protein: 5.9 g/dL — ABNORMAL LOW (ref 6.5–8.1)

## 2019-09-13 LAB — CBC WITH DIFFERENTIAL/PLATELET
Abs Immature Granulocytes: 0.12 10*3/uL — ABNORMAL HIGH (ref 0.00–0.07)
Basophils Absolute: 0 10*3/uL (ref 0.0–0.1)
Basophils Relative: 0 %
Eosinophils Absolute: 0 10*3/uL (ref 0.0–0.5)
Eosinophils Relative: 0 %
HCT: 34.9 % — ABNORMAL LOW (ref 36.0–46.0)
Hemoglobin: 10.9 g/dL — ABNORMAL LOW (ref 12.0–15.0)
Immature Granulocytes: 2 %
Lymphocytes Relative: 13 %
Lymphs Abs: 0.9 10*3/uL (ref 0.7–4.0)
MCH: 28.9 pg (ref 26.0–34.0)
MCHC: 31.2 g/dL (ref 30.0–36.0)
MCV: 92.6 fL (ref 80.0–100.0)
Monocytes Absolute: 0.4 10*3/uL (ref 0.1–1.0)
Monocytes Relative: 5 %
Neutro Abs: 5.5 10*3/uL (ref 1.7–7.7)
Neutrophils Relative %: 80 %
Platelets: 193 10*3/uL (ref 150–400)
RBC: 3.77 MIL/uL — ABNORMAL LOW (ref 3.87–5.11)
RDW: 13.3 % (ref 11.5–15.5)
WBC: 6.9 10*3/uL (ref 4.0–10.5)
nRBC: 0 % (ref 0.0–0.2)

## 2019-09-13 LAB — FERRITIN: Ferritin: 145 ng/mL (ref 11–307)

## 2019-09-13 LAB — C-REACTIVE PROTEIN: CRP: 2.8 mg/dL — ABNORMAL HIGH (ref <1.0)

## 2019-09-13 LAB — PHOSPHORUS: Phosphorus: 3.3 mg/dL (ref 2.5–4.6)

## 2019-09-13 LAB — MAGNESIUM: Magnesium: 1.9 mg/dL (ref 1.7–2.4)

## 2019-09-13 LAB — D-DIMER, QUANTITATIVE: D-Dimer, Quant: 0.52 ug/mL-FEU — ABNORMAL HIGH (ref 0.00–0.50)

## 2019-09-13 MED ORDER — SODIUM CHLORIDE 0.9 % IV SOLN
100.0000 mg | INTRAVENOUS | Status: DC
  Administered 2019-09-13: 100 mg via INTRAVENOUS
  Filled 2019-09-13: qty 20

## 2019-09-13 NOTE — Discharge Summary (Signed)
Physician Discharge Summary  Alexandra Stein QQV:[id number] DOB: 1966/03/07 DOA: 09/10/2019  PCP: Elias Else, MD  Admit date: 09/10/2019 Discharge date: 09/13/2019  Time spent: 40 minutes  Recommendations for Outpatient Follow-up:  1. Follow outpatient CBC/CMP 2. Quarantine per [facility] recommendations   Discharge Diagnoses:  Principal Problem:   Acute respiratory disease due to COVID-19 virus Active Problems:   Bipolar disorder (HCC)   Hyperlipidemia   Obesity, Class III, BMI 40-49.9 (morbid obesity) (HCC)   Discharge Condition: stable  Diet recommendation: heart healthy  Filed Weights   09/10/19 1900  Weight: 120 kg    History of present illness:  Alexandra L Hawksis a 53 y.o.femalewith medical history significant ofbipolar d/o; HLD; and morbid obesity (BMI 48) presenting with worsening COVID symptoms. She developed cough last Thursday and and was seen by PCP and started on azithromycin. She presented to the ER on 11/1 with fever and cough; CXR showed possible infiltrate and so Azithro was changed to Doxy. She was notified yesterday that her COVID test was positive, and has had progressive SOB, emesis, and myalgias. She called her PCP, who encouraged her to return to the ER. She is uncertain how she acquired COVID but her husband and family members with whom she has had recent contact are all in quarantine and planning to be tested.  ED Course:COVID - seen yesterday, weak, feels awful. Mild SOB, no hypoxia, CXR with worsening PNA compared to yesterday. Taking Doxy.  She was seen for COVID 19 pneumonia.  She's improved with steroids and remdesivir.  She was discharged on 11/6 with return precautions.  Hospital Course:  Acute respiratory failure with hypoxia  COVID 19 Pneumonia -Patient withprior + COVID test on 11/1presenting withworseningSOB, cough, fever -Anorexiawith n/v - CT chest with diffuse patchy infiltrates in both lungs, enlarged mediastinal and  hilar LN - Daily labs (CBC, CMP, D dimer, ferritin, CRP) - elevated, but improving - D/c abx with negative PCT - Continue dexamethasone, remdesivir (day 4) - she's improved, able to ambulate maintaining her sats and with improved SOB -With D-dimer <5, will use standard-dosed Lovenox for DVT prevention - she requested work note for 11/23.  Since her sx started on 10/29 per my discussion with her -> it's recommended she quarantine at least 20 days from sx onset with her severe COVID 19 infection with at least 24 hrs without sx without the use of medications.  Wrote letter for her as 11/23 would fall outside this period, but recommended she see her physician if sx recurred for new recommendations.  COVID-19 Labs  Recent Labs    09/11/19 0600 09/12/19 0035 09/13/19 0555  DDIMER 0.83* 0.63* 0.52*  FERRITIN 206 170 145  CRP 12.2* 8.2* 2.8*    Lab Results  Component Value Date   SARSCOV2NAA POSITIVE (A) 09/08/2019   SARSCOV2NAA NEGATIVE 05/22/2019   Elevated LFT's: likely 2/2 above Acute hepatitis panel - negative Consider RUQ [place] if not improving Improving -> follow outpatient  Lightheadedness: improved  Morbid obesity -BMI48 -Weight loss should be encouraged -Outpatient PCP/bariatric medicine/bariatric surgery f/u encouraged  HLD -She does not appear to be taking medication for this issue at this time  Bipolar d/o -Continue home meds including Abilify; Cymbalta; and Topamax - discussed need to discuss antipsychotics with PCP who prescribes these  Procedures: none  Consultations:  none  Discharge Exam: Vitals:   09/13/19 0357 09/13/19 0817  BP: (!) 141/91 135/78  Pulse: 67 78  Resp: 20 18  Temp: 97.7 F (36.5 C) 97.6  F (36.4 C)  SpO2: 94% 94%   Feels much better.  SOB improved.    General: No acute distress. Cardiovascular: RRR Lungs: unlabored Abdomen: Soft, nontender, nondistended Neurological: Alert and oriented 3. Moves all extremities 4 .  Cranial nerves II through XII grossly intact. Skin: Warm and dry. No rashes or lesions. Extremities: No clubbing or cyanosis. No edema.    Discharge Instructions   Discharge Instructions    Call MD for:  difficulty breathing, headache or visual disturbances   Complete by: As directed    Call MD for:  extreme fatigue   Complete by: As directed    Call MD for:  hives   Complete by: As directed    Call MD for:  persistant dizziness or light-headedness   Complete by: As directed    Call MD for:  persistant nausea and vomiting   Complete by: As directed    Call MD for:  redness, tenderness, or signs of infection (pain, swelling, redness, odor or green/yellow discharge around incision site)   Complete by: As directed    Call MD for:  severe uncontrolled pain   Complete by: As directed    Call MD for:  temperature >100.4   Complete by: As directed    Diet - low sodium heart healthy   Complete by: As directed    Discharge instructions   Complete by: As directed    You were seen for COVID 19 pneumonia.  You've improved with steroids and remdesivir.   You'll need to continue isolation for at least 20 days since your symptoms started with at least 24 hours without symptoms (without using additional medications).  COVID 19 is an unpredictable illness, return for new, recurrent, or worsening symptoms.   Please discuss your abilify and zyprexa with your PCP.  Please ask your PCP to request records from this hospitalization so they know what was done and what the next steps will be.   Increase activity slowly   Complete by: As directed      Allergies as of 09/13/2019      Reactions   Codeine Other (See Comments)   Hallucinations and fever. Pt CAN take Vicodin and Percocet.      Medication List    TAKE these medications   acetaminophen 500 MG tablet Commonly known as: TYLENOL Take 1,000 mg by mouth every 6 (six) hours as needed for moderate pain.   ARIPiprazole 15 MG  tablet Commonly known as: ABILIFY Take 15 mg by mouth at bedtime.   aspirin 81 MG EC tablet Take 1 tablet (81 mg total) by mouth daily.   DULoxetine 60 MG capsule Commonly known as: CYMBALTA Take 60 mg by mouth at bedtime.   furosemide 40 MG tablet Commonly known as: LASIX Take 40 mg by mouth every morning.   OLANZapine 10 MG tablet Commonly known as: ZYPREXA Take 10 mg by mouth at bedtime.   omeprazole 20 MG capsule Commonly known as: PRILOSEC Take 20 mg by mouth every morning.   topiramate 100 MG tablet Commonly known as: TOPAMAX Take 100 mg by mouth at bedtime.      Allergies  Allergen Reactions  . Codeine Other (See Comments)    Hallucinations and fever. Pt CAN take Vicodin and Percocet.      The results of significant diagnostics from this hospitalization (including imaging, microbiology, ancillary and laboratory) are listed below for reference.    Significant Diagnostic Studies: Ct Angio Chest Pe W Or Wo Contrast  Result Date:  09/11/2019 CLINICAL DATA:  Midsternal chest pain and shortness of breath. EXAM: CT ANGIOGRAPHY CHEST WITH CONTRAST TECHNIQUE: Multidetector CT imaging of the chest was performed using the standard protocol during bolus administration of intravenous contrast. Multiplanar CT image reconstructions and MIPs were obtained to evaluate the vascular anatomy. CONTRAST:  OMNIPAQUE IOHEXOL 350 MG/ML SOLN COMPARISON:  10/25/2015 FINDINGS: Cardiovascular: Borderline heart size. No pericardial effusion. The aorta is normal in caliber. No dissection. No atherosclerotic calcifications. The branch vessels are patent. No definite coronary artery calcifications. The pulmonary arterial tree is fairly well opacified. No filling defects to suggest pulmonary embolism. Mediastinum/Nodes: Borderline enlarged mediastinal and hilar lymph nodes, likely inflammatory. 12 mm right paratracheal node on image 37/4. 14 mm right hilar node on image 56/4. 10.5 mm subcarinal  node on image 60/for. The esophagus is grossly normal. Lungs/Pleura: Diffuse patchy airspace process in both lungs likely reflecting diffuse atypical infiltrates. No pleural effusion. No worrisome pulmonary lesions. Upper Abdomen: No significant upper abdominal findings. Musculoskeletal: No significant bony findings. Review of the MIP images confirms the above findings. IMPRESSION: 1. No CT findings for acute pulmonary embolism. 2. Normal thoracic aorta. 3. Borderline enlarged mediastinal and hilar lymph nodes, likely inflammatory/hyperplastic. 4. Diffuse patchy infiltrates in both lungs most likely diffuse atypical infection. Recommend correlation with COVID status. Electronically Signed   By: Rudie Meyer M.D.   On: 09/11/2019 12:01   Dg Chest Port 1 View  Result Date: 09/12/2019 CLINICAL DATA:  Shortness of breath EXAM: PORTABLE CHEST 1 VIEW COMPARISON:  09/10/2019 FINDINGS: Patchy bilateral airspace opacities noted. Findings concerning for infection, less likely edema. Heart is upper limits normal in size. No effusions. No acute bony abnormality. IMPRESSION: Patchy bilateral airspace opacities diffusely concerning for multifocal pneumonia. Electronically Signed   By: Charlett Nose M.D.   On: 09/12/2019 20:57   Dg Chest Port 1 View  Result Date: 09/10/2019 CLINICAL DATA:  53 year old female with shortness of breath and vomiting. Tested positive for COVID-19. Yesterday. EXAM: PORTABLE CHEST 1 VIEW COMPARISON:  Portable chest 09/08/2019 and earlier. FINDINGS: Portable AP upright view at 1417 hours. Stable somewhat low lung volumes. Mediastinal contours remain normal. Visualized tracheal air column is within normal limits. Mild patchy and indistinct mid and lower lung opacities, mildly progressed from 09/08/2019. No consolidation, pneumothorax, pulmonary edema or pleural effusion. Negative visible bowel gas pattern. No acute osseous abnormality identified. IMPRESSION: Mild progression from 09/08/2019 of  patchy and indistinct mid and lower lung opacities compatible with COVID-19 pneumonia. Electronically Signed   By: Odessa Fleming M.D.   On: 09/10/2019 14:25   Dg Chest Portable 1 View  Result Date: 09/08/2019 CLINICAL DATA:  Productive cough, fever, and shortness of breath. EXAM: PORTABLE CHEST 1 VIEW COMPARISON:  05/22/2019 FINDINGS: The heart size and mediastinal contours are within normal limits. New mild streaky opacity seen in both lung bases, which may be due to atelectasis or early infiltrates. No evidence of pleural effusion. IMPRESSION: New mild bibasilar atelectasis versus early infiltrates. Electronically Signed   By: Danae Orleans M.D.   On: 09/08/2019 17:26    Microbiology: Recent Results (from the past 240 hour(s))  SARS CORONAVIRUS 2 (TAT 6-24 HRS) Nasopharyngeal Nasopharyngeal Swab     Status: Abnormal   Collection Time: 09/08/19  5:26 PM   Specimen: Nasopharyngeal Swab  Result Value Ref Range Status   SARS Coronavirus 2 POSITIVE (A) NEGATIVE Final    Comment: EMAILED TO LORI BERDICK AT 2002 09/09/2019 MCCORMICK K (NOTE) SARS-CoV-2 target nucleic acids are  DETECTED. The SARS-CoV-2 RNA is generally detectable in upper and lower respiratory specimens during the acute phase of infection. Positive results are indicative of active infection with SARS-CoV-2. Clinical  correlation with patient history and other diagnostic information is necessary to determine patient infection status. Positive results do  not rule out bacterial infection or co-infection with other viruses. The expected result is Negative. Fact Sheet for Patients: HairSlick.nohttps://www.fda.gov/media/138098/download Fact Sheet for Healthcare Providers: quierodirigir.comhttps://www.fda.gov/media/138095/download This test is not yet approved or cleared by the [place] FDA and  has been authorized for detection and/or diagnosis of SARS-CoV-2 by FDA under an Emergency Use Authorization (EUA). This EUA will remain  in effect (meaning this test  can be used) for the duration of the COVID-19 dec laration under Section 564(b)(1) of the Act, 21 U.S.C. section 360bbb-3(b)(1), unless the authorization is terminated or revoked sooner. Performed at [facility], [address] [place], [place] [address]      Labs: Basic Metabolic Panel: Recent Labs  Lab 09/08/19 1558 09/10/19 1425 09/11/19 0600 09/12/19 0035 09/13/19 0555  NA 139 140 141 140 144  K 3.9 3.6 4.1 3.8 3.5  CL 109 110 112* 112* 110  CO2 20* 19* 20* 19* 22  GLUCOSE 103* 110* 150* 167* 134*  BUN 12 17 19  21* 21*  CREATININE 0.89 0.96 0.78 0.72 0.80  CALCIUM 8.8* 8.5* 8.4* 8.8* 8.4*  MG  --   --  2.0 1.9 1.9  PHOS  --   --  2.6 2.5 3.3   Liver Function Tests: Recent Labs  Lab 09/08/19 1558 09/10/19 1425 09/11/19 0600 09/12/19 0035 09/13/19 0555  AST 76* 119* 156* 81* 61*  ALT 67* 102* 128* 113* 101*  ALKPHOS 140* 198* 192* 163* 149*  BILITOT 0.8 0.5 0.6 0.7 0.7  PROT 6.9 6.7 6.9 6.4* 5.9*  ALBUMIN 3.6 3.4* 3.3* 3.1* 2.8*   Recent Labs  Lab 09/10/19 1425  LIPASE 32   No results for input(s): AMMONIA in the last 168 hours. CBC: Recent Labs  Lab 09/08/19 1558 09/10/19 1425 09/11/19 0600 09/12/19 0035 09/13/19 0555  WBC 4.1 4.2 3.3* 6.9 6.9  NEUTROABS 3.0 2.9 2.5 5.8 5.5  HGB 13.3 13.2 12.4 11.5* 10.9*  HCT 41.0 41.2 39.2 36.6 34.9*  MCV 91.1 91.4 92.0 91.5 92.6  PLT 163 177 174 184 193   Cardiac Enzymes: No results for input(s): CKTOTAL, CKMB, CKMBINDEX, TROPONINI in the last 168 hours. BNP: BNP (last 3 results) Recent Labs    09/10/19 1649  BNP 6.1    ProBNP (last 3 results) No results for input(s): PROBNP in the last 8760 hours.  CBG: No results for input(s): GLUCAP in the last 168 hours.     Signed:  Lacretia Nicksaldwell Powell MD.  [facility] 09/13/2019, 6:43 PM

## 2019-09-13 NOTE — Progress Notes (Signed)
Patient was discharged to home, AVS was reviewed and all questions answered. Reviewed follow-up directions and patient will make an appointment with her PCP. Patient verbalized understanding of quarantine guidelines, and contract was signed and placed in her chart. No new prescriptions this admission. IV was removed, site clean dry and intact. Patient confirmed she had all of her belongings, she was assisted to the exit, and her husband provided transportation.

## 2019-09-13 NOTE — TOC Transition Note (Signed)
Transition of Care [facility]) - CM/SW Discharge Note   Patient Details  Name: Alexandra Stein MRN: [id number] Date of Birth: [birth year]  [facility] CM/SW Contact:  Ninfa Meeker, RN Phone Number: 09/13/2019, 10:13 AM   Clinical Narrative:   Patient has no case manager needs. Will discharge home with family support.    Final next level of care: Home/Self Care Barriers to Discharge: No Barriers Identified   Patient Goals and CMS Choice        Discharge Placement                       Discharge Plan and Services In-house Referral: NA Discharge Planning Services: NA Post Acute Care Choice: NA          DME Arranged: N/A DME Agency: NA                  Social Determinants of Health (SDOH) Interventions     Readmission Risk Interventions No flowsheet data found.

## 2020-01-25 ENCOUNTER — Ambulatory Visit (HOSPITAL_COMMUNITY)
Admission: EM | Admit: 2020-01-25 | Discharge: 2020-01-25 | Disposition: A | Discharge status: Home / Self Care | Payer: BC Managed Care – PPO | Attending: Family Medicine | Admitting: Family Medicine

## 2020-01-25 ENCOUNTER — Other Ambulatory Visit: Payer: Self-pay

## 2020-01-25 ENCOUNTER — Encounter (HOSPITAL_COMMUNITY): Payer: Self-pay | Admitting: Family Medicine

## 2020-01-25 DIAGNOSIS — R58 Hemorrhage, not elsewhere classified: Secondary | ICD-10-CM | POA: Diagnosis not present

## 2020-01-25 LAB — CBC WITH DIFFERENTIAL/PLATELET
Abs Immature Granulocytes: 0.02 10*3/uL (ref 0.00–0.07)
Basophils Absolute: 0 10*3/uL (ref 0.0–0.1)
Basophils Relative: 0 %
Eosinophils Absolute: 0.2 10*3/uL (ref 0.0–0.5)
Eosinophils Relative: 2 %
HCT: 44.6 % (ref 36.0–46.0)
Hemoglobin: 14.2 g/dL (ref 12.0–15.0)
Immature Granulocytes: 0 %
Lymphocytes Relative: 36 %
Lymphs Abs: 2.6 10*3/uL (ref 0.7–4.0)
MCH: 29.1 pg (ref 26.0–34.0)
MCHC: 31.8 g/dL (ref 30.0–36.0)
MCV: 91.4 fL (ref 80.0–100.0)
Monocytes Absolute: 0.5 10*3/uL (ref 0.1–1.0)
Monocytes Relative: 7 %
Neutro Abs: 3.9 10*3/uL (ref 1.7–7.7)
Neutrophils Relative %: 55 %
Platelets: 252 10*3/uL (ref 150–400)
RBC: 4.88 MIL/uL (ref 3.87–5.11)
RDW: 12.9 % (ref 11.5–15.5)
WBC: 7.2 10*3/uL (ref 4.0–10.5)
nRBC: 0 % (ref 0.0–0.2)

## 2020-01-25 LAB — COMPREHENSIVE METABOLIC PANEL
ALT: 26 U/L (ref 0–44)
AST: 24 U/L (ref 15–41)
Albumin: 3.7 g/dL (ref 3.5–5.0)
Alkaline Phosphatase: 96 U/L (ref 38–126)
Anion gap: 9 (ref 5–15)
BUN: 19 mg/dL (ref 6–20)
CO2: 24 mmol/L (ref 22–32)
Calcium: 9.5 mg/dL (ref 8.9–10.3)
Chloride: 109 mmol/L (ref 98–111)
Creatinine, Ser: 0.92 mg/dL (ref 0.44–1.00)
GFR calc Af Amer: >60 mL/min (ref >60)
GFR calc non Af Amer: >60 mL/min (ref >60)
Glucose, Bld: 95 mg/dL (ref 70–99)
Potassium: 4 mmol/L (ref 3.5–5.1)
Sodium: 142 mmol/L (ref 135–145)
Total Bilirubin: 0.8 mg/dL (ref 0.3–1.2)
Total Protein: 6.9 g/dL (ref 6.5–8.1)

## 2020-01-25 NOTE — Discharge Instructions (Signed)
We are checking some blood work to rule out a few things that could be causing this bleeding. If all this is normal I would follow-up with your doctor next week for further evaluation I will call you with any abnormal blood work.

## 2020-01-25 NOTE — ED Triage Notes (Signed)
Pt sts starting yesterday noticing multiple areas where she is oozing blood drainage from different areas of her body; pt denies hx of same and denies injuring herself

## 2020-01-27 NOTE — ED Provider Notes (Signed)
[facility]    CSN: [id number] Arrival date & time: 01/25/20  1652      History   Chief Complaint Chief Complaint  Patient presents with  . Bleeding/Bruising    HPIVictoria L Stein is a 54 y.o. female.   Patient is a 54 year old female that presents today with bleeding.  Reporting over the past couple days she has had small areas of bleeding that spontaneously occur on the lower extremities and upper extremities.  Mild discoloration to the area prior to this starting.  Denies any pain with this.  Denies any itching.  No history of DVT, PE, vascular insufficiency.  Otherwise she has been feeling normal.  ROS per HPI      Past Medical History:  Diagnosis Date  . Bipolar 2 disorder (HCC)   . GERD (gastroesophageal reflux disease)   . H/O chest pain    dx'd with GERD  . History of syncope 2002   Vasovagal  . Hyperlipidemia   . Migraines   . Potassium (K) deficiency    per pt K+ deficiency - ? from meds she takes ( none of which are diuretucs)  . SVD (spontaneous vaginal delivery)    x 2    Patient Active Problem List   Diagnosis Date Noted  . Acute respiratory disease due to COVID-19 virus 09/10/2019  . Obesity, Class III, BMI 40-49.9 (morbid obesity) (HCC) 09/10/2019  . Chest pain at rest 05/22/2019  . Attention deficit hyperactivity disorder (ADHD), predominantly inattentive type 09/18/2017  . Depression 01/18/2016  . Syncope 01/18/2016  . Hyperlipidemia   . GERD (gastroesophageal reflux disease)   . "walking corpse" syndrome   . Alteration of consciousness   . Symptomatic cholelithiasis 07/26/2012  . Bipolar disorder (HCC) 07/26/2012  . Chest pain 12/12/2011  . Hypotension 12/12/2011    Past Surgical History:  Procedure Laterality Date  . CESAREAN SECTION     x 1  . CHOLECYSTECTOMY  07/27/2012   Procedure: LAPAROSCOPIC CHOLECYSTECTOMY;  Surgeon: Shelly Rubenstein, MD;  Location: [facility];  Service: General;  Laterality: N/A;  . TUBAL LIGATION      x 2  . WISDOM TOOTH EXTRACTION      OB History   No obstetric history on file.      Home Medications    Prior to Admission medications   Medication Sig Start Date End Date Taking? Authorizing Provider  acetaminophen (TYLENOL) 500 MG tablet Take 1,000 mg by mouth every 6 (six) hours as needed for moderate pain.    [provider]  ARIPiprazole (ABILIFY) 15 MG tablet Take 15 mg by mouth at bedtime. 08/29/19   [provider]  aspirin EC 81 MG EC tablet Take 1 tablet (81 mg total) by mouth daily. 05/24/19   Dorcas Carrow, MD  DULoxetine (CYMBALTA) 60 MG capsule Take 60 mg by mouth at bedtime.     [provider]  furosemide (LASIX) 40 MG tablet Take 40 mg by mouth every morning. 08/29/19   [provider]  OLANZapine (ZYPREXA) 10 MG tablet Take 10 mg by mouth at bedtime. 08/29/19   [provider]  omeprazole (PRILOSEC) 20 MG capsule Take 20 mg by mouth every morning. 07/18/19   [provider]  topiramate (TOPAMAX) 100 MG tablet Take 100 mg by mouth at bedtime.     [provider]    Family History Family History  Problem Relation Age of Onset  . Dilated cardiomyopathy Mother  with ICD  . Heart disease Mother   . Atrial fibrillation Mother   . Lung cancer Maternal Grandfather        black lung  . Cancer Maternal Grandfather   . Diabetes type II Father   . Cancer Father   . Kidney failure Father   . Dementia Maternal Grandmother   . Cancer Maternal Grandmother   . Stroke Maternal Grandmother   . Breast cancer Other   . Healthy Brother   . Healthy Child        x3    Social History Social History   Tobacco Use  . Smoking status: Never Smoker  . Smokeless tobacco: Never Used  Substance Use Topics  . Alcohol use: No  . Drug use: No     Allergies   Codeine   Review of Systems Review of Systems   Physical Exam Triage Vital Signs ED Triage Vitals [01/25/20 1753]  Enc Vitals Group     BP  (!) 164/89     Pulse Rate 85     Resp 18     Temp 98.1 F (36.7 C)     Temp Source Oral     SpO2 98 %     Weight      Height      Head Circumference      Peak Flow      Pain Score 0     Pain Loc      Pain Edu?      Excl. in GC?    No data found.  Updated Vital Signs BP (!) 164/89 (BP Location: Right Arm)   Pulse 85   Temp 98.1 F (36.7 C) (Oral)   Resp 18   LMP 08/18/2012   SpO2 98%   Visual Acuity Right Eye Distance:   Left Eye Distance:   Bilateral Distance:    Right Eye Near:   Left Eye Near:    Bilateral Near:     Physical Exam Vitals and nursing note reviewed.  Constitutional:      General: She is not in acute distress.    Appearance: Normal appearance. She is not ill-appearing, toxic-appearing or diaphoretic.  HENT:     Head: Normocephalic.     Nose: Nose normal.     Mouth/Throat:     Pharynx: Oropharynx is clear.  Eyes:     Conjunctiva/sclera: Conjunctivae normal.  Pulmonary:     Effort: Pulmonary effort is normal.  Musculoskeletal:        General: Normal range of motion.     Cervical back: Normal range of motion.  Skin:    General: Skin is warm and dry.     Findings: No rash.     Comments: Tiny pinhole size areas to lower extremity with dried blood, left anterior ankle and right posterior ankle.  2 or 3 petechiae to lower extremities No increased swelling or erythema to extremities.   Neurological:     Mental Status: She is alert.  Psychiatric:        Mood and Affect: Mood normal.      UC Treatments / Results  Labs (all labs ordered are listed, but only abnormal results are displayed) Labs Reviewed  CBC WITH DIFFERENTIAL/PLATELET  COMPREHENSIVE METABOLIC PANEL    EKG   Radiology No results found.  Procedures Procedures (including critical care time)  Medications Ordered in UC Medications - No data to display  Initial Impression / Assessment and Plan / UC Course  I have reviewed the  triage vital signs and the nursing  notes.  Pertinent labs & imaging results that were available during my care of the patient were reviewed by me and considered in my medical decision making (see chart for details).     Bleeding-most likely bleeding is coming from capillaries that have burst in the lower extremities. We will draw some blood work and rule out other things to include ITP.  This is less likely.  Her skin is also very dry this could be just dry skin opening up and bleeding.  Blood work unremarkable. Follow-up with PCP Final Clinical Impressions(s) / UC Diagnoses   Final diagnoses:  Bleeding     Discharge Instructions     We are checking some blood work to rule out a few things that could be causing this bleeding. If all this is normal I would follow-up with your doctor next week for further evaluation I will call you with any abnormal blood work.    ED Prescriptions    None     PDMP not reviewed this encounter.   Orvan July, NP 01/27/20 1028

## 2020-02-19 ENCOUNTER — Ambulatory Visit (INDEPENDENT_AMBULATORY_CARE_PROVIDER_SITE_OTHER): Payer: BC Managed Care – PPO

## 2020-02-19 ENCOUNTER — Ambulatory Visit (HOSPITAL_COMMUNITY): Payer: BC Managed Care – PPO

## 2020-02-19 ENCOUNTER — Encounter (HOSPITAL_COMMUNITY): Payer: Self-pay

## 2020-02-19 ENCOUNTER — Other Ambulatory Visit: Payer: Self-pay

## 2020-02-19 ENCOUNTER — Ambulatory Visit (HOSPITAL_COMMUNITY)
Admission: EM | Admit: 2020-02-19 | Discharge: 2020-02-19 | Disposition: A | Discharge status: Home / Self Care | Payer: BC Managed Care – PPO | Attending: Family Medicine | Admitting: Family Medicine

## 2020-02-19 DIAGNOSIS — M25461 Effusion, right knee: Secondary | ICD-10-CM

## 2020-02-19 DIAGNOSIS — W19XXXA Unspecified fall, initial encounter: Secondary | ICD-10-CM | POA: Diagnosis not present

## 2020-02-19 DIAGNOSIS — M25561 Pain in right knee: Secondary | ICD-10-CM | POA: Diagnosis not present

## 2020-02-19 NOTE — ED Provider Notes (Signed)
[name]    CSN: [id number] Arrival date & time: 02/19/20  1810      History   Chief Complaint Chief Complaint  Patient presents with  . Knee Pain    HPI Alexandra Stein is a 54 y.o. female.   Patient reports that she has been having right knee pain since earlier today.  She reports that when she got out of bed this morning, that she felt her right knee click and pop and since then it has given out on her and she has fallen 3 times today.  Denies taking any medications or attempting to treat her knee pain at home.  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  Denies headache, cough, shortness of breath, previous knee injury, nausea, vomiting, diarrhea, rash, fever, other symptoms.  ROS per HPI  The history is provided by the patient.    Past Medical History:  Diagnosis Date  . Bipolar 2 disorder ([place])   . GERD (gastroesophageal reflux disease)   . H/O chest pain    dx'd with GERD  . History of syncope 2002   Vasovagal  . Hyperlipidemia   . Migraines   . Potassium (K) deficiency    per pt K+ deficiency - ? from meds she takes ( none of which are diuretucs)  . SVD (spontaneous vaginal delivery)    x 2    Patient Active Problem List   Diagnosis Date Noted  . Acute respiratory disease due to COVID-19 virus 09/10/2019  . Obesity, Class III, BMI 40-49.9 (morbid obesity) ([place]) 09/10/2019  . Chest pain at rest 05/22/2019  . Attention deficit hyperactivity disorder (ADHD), predominantly inattentive type 09/18/2017  . Depression 01/18/2016  . Syncope 01/18/2016  . Hyperlipidemia   . GERD (gastroesophageal reflux disease)   . "walking corpse" syndrome   . Alteration of consciousness   . Symptomatic cholelithiasis 07/26/2012  . Bipolar disorder ([place]) 07/26/2012  . Chest pain 12/12/2011  . Hypotension 12/12/2011    Past Surgical History:  Procedure Laterality Date  . CESAREAN SECTION     x 1  . CHOLECYSTECTOMY   07/27/2012   Procedure: LAPAROSCOPIC CHOLECYSTECTOMY;  Surgeon: Harl Bowie, MD;  Location: [facility];  Service: General;  Laterality: N/A;  . TUBAL LIGATION     x 2  . WISDOM TOOTH EXTRACTION      OB History   No obstetric history on file.      Home Medications    Prior to Admission medications   Medication Sig Start Date End Date Taking? Authorizing Provider  acetaminophen (TYLENOL) 500 MG tablet Take 1,000 mg by mouth every 6 (six) hours as needed for moderate pain.    [provider]  ARIPiprazole (ABILIFY) 15 MG tablet Take 15 mg by mouth at bedtime. 08/29/19   [provider]  aspirin EC 81 MG EC tablet Take 1 tablet (81 mg total) by mouth daily. 05/24/19   Barb Merino, MD  DULoxetine (CYMBALTA) 60 MG capsule Take 60 mg by mouth at bedtime.     [provider]  furosemide (LASIX) 40 MG tablet Take 40 mg by mouth every morning. 08/29/19   [provider]  OLANZapine (ZYPREXA) 10 MG tablet Take 10 mg by mouth at bedtime. 08/29/19   [provider]  omeprazole (PRILOSEC) 20 MG capsule Take 20 mg by mouth every morning. 07/18/19   [provider]  topiramate (TOPAMAX) 100 MG tablet Take  100 mg by mouth at bedtime.     [provider]    Family History Family History  Problem Relation Age of Onset  . Dilated cardiomyopathy Mother        with ICD  . Heart disease Mother   . Atrial fibrillation Mother   . Lung cancer Maternal Grandfather        black lung  . Cancer Maternal Grandfather   . Diabetes type II Father   . Cancer Father   . Kidney failure Father   . Dementia Maternal Grandmother   . Cancer Maternal Grandmother   . Stroke Maternal Grandmother   . Breast cancer Other   . Healthy Brother   . Healthy Child        x3    Social History Social History   Tobacco Use  . Smoking status: Never Smoker  . Smokeless tobacco: Never Used  Substance Use Topics  . Alcohol use: No  . Drug use: No      Allergies   Codeine   Review of Systems Review of Systems   Physical Exam Triage Vital Signs ED Triage Vitals  Enc Vitals Group     BP 02/19/20 1826 117/77     Pulse Rate 02/19/20 1826 85     Resp 02/19/20 1826 17     Temp 02/19/20 1826 98.3 F (36.8 C)     Temp Source 02/19/20 1826 Oral     SpO2 02/19/20 1826 95 %     Weight --      Height --      Head Circumference --      Peak Flow --      Pain Score 02/19/20 1825 9     Pain Loc --      Pain Edu? --      Excl. in GC? --    No data found.  Updated Vital Signs BP 117/77 (BP Location: Right Arm)   Pulse 85   Temp 98.3 F (36.8 C) (Oral)   Resp 17   LMP 08/18/2012   SpO2 95%   Visual Acuity Right Eye Distance:   Left Eye Distance:   Bilateral Distance:    Right Eye Near:   Left Eye Near:    Bilateral Near:     Physical Exam Vitals and nursing note reviewed.  Constitutional:      General: She is not in acute distress.    Appearance: She is well-developed. She is obese. She is not ill-appearing.  HENT:     Head: Normocephalic and atraumatic.  Eyes:     Extraocular Movements: Extraocular movements intact.     Conjunctiva/sclera: Conjunctivae normal.     Pupils: Pupils are equal, round, and reactive to light.  Cardiovascular:     Rate and Rhythm: Normal rate and regular rhythm.     Heart sounds: Normal heart sounds. No murmur.  Pulmonary:     Effort: Pulmonary effort is normal. No respiratory distress.     Breath sounds: Normal breath sounds.  Abdominal:     Palpations: Abdomen is soft.     Tenderness: There is no abdominal tenderness.  Musculoskeletal:        General: Swelling and tenderness present.     Cervical back: Normal range of motion and neck supple.     Comments: Swelling and tenderness to right knee at medial and lateral aspects of the joint.  Skin:    General: Skin is warm and dry.  Neurological:  General: No focal deficit present.     Mental Status: She is alert and oriented to  person, place, and time.  Psychiatric:        Mood and Affect: Mood normal.        Behavior: Behavior normal.        Thought Content: Thought content normal.      UC Treatments / Results  Labs (all labs ordered are listed, but only abnormal results are displayed) Labs Reviewed - No data to display  EKG   Radiology DG Knee Complete 4 Views Right  Result Date: 02/19/2020 CLINICAL DATA:  Knee pain and weakness EXAM: RIGHT KNEE - COMPLETE 4+ VIEW COMPARISON:  None. FINDINGS: No evidence of fracture, dislocation, or joint effusion. No evidence of arthropathy or other focal bone abnormality. Soft tissues are unremarkable. IMPRESSION: No acute abnormality noted. Electronically Signed   By: Alcide Clever M.D.   On: 02/19/2020 19:18    Procedures Procedures (including critical care time)  Medications Ordered in UC Medications - No data to display  Initial Impression / Assessment and Plan / UC Course  I have reviewed the triage vital signs and the nursing notes.  Pertinent labs & imaging results that were available during my care of the patient were reviewed by me and considered in my medical decision making (see chart for details).     Acute knee pain, right: Since this morning.  Patient declines pain medication in office today.  Has fallen 3 times today since knee has given out on her.  X-ray of the right knee shows no broken bones no bony abnormalities.  Patient instructed that she may take ibuprofen or Tylenol as needed for pain.  She may apply ice packs for 20 minutes at a time 3-4 times a day.  Patient instructed that she needs to follow-up with orthopedics for a likely ligamental tear or sprain, given the laxity and pain in the medial and lateral aspects of the right knee.  Discussed with patient that if there is a tear in the ligament, this usually requires surgical repair.  Discussed limitations of x-ray in the office, and that we cannot rule out soft tissue injury or tear.  Patient  verbalized understanding and agreed with treatment.  Patient instructed to follow-up with orthopedics tomorrow.  Patient instructed to follow-up in the ER for loss of sensation, loss of strength, other concerning symptoms. Final Clinical Impressions(s) / UC Diagnoses   Final diagnoses:  Acute pain of right knee  Fall, initial encounter  Pain and swelling of right knee     Discharge Instructions     Take the ibuprofen as prescribed.  Rest and elevate your leg.  Apply ice packs 2-3 times a day for up to 20 minutes each.  Wear a knee brace when up and moving around.  Follow up with your primary care provider or an orthopedist if you symptoms continue or worsen;  Or if you develop new symptoms, such as numbness, tingling, or weakness.    The x-ray of your knee was negative for any broken bones or bony abnormality.  I would recommend that you follow-up with orthopedics and give them a call tomorrow.     ED Prescriptions    None     I have reviewed the PDMP during this encounter.   Moshe Cipro, NP 02/20/20 [phone]

## 2020-02-19 NOTE — Discharge Instructions (Addendum)
Take the ibuprofen as prescribed.  Rest and elevate your leg.  Apply ice packs 2-3 times a day for up to 20 minutes each.  Wear a knee brace when up and moving around.  Follow up with your primary care provider or an orthopedist if you symptoms continue or worsen;  Or if you develop new symptoms, such as numbness, tingling, or weakness.    The x-ray of your knee was negative for any broken bones or bony abnormality.  I would recommend that you follow-up with orthopedics and give them a call tomorrow.

## 2020-02-19 NOTE — ED Triage Notes (Signed)
Pt presents with shooting sharp right knee pain X 4 days from unknown source; pt states her knee has been weak and buckled beneath her.

## 2020-03-31 ENCOUNTER — Other Ambulatory Visit: Payer: Self-pay

## 2020-03-31 ENCOUNTER — Ambulatory Visit (HOSPITAL_COMMUNITY)
Admission: EM | Admit: 2020-03-31 | Discharge: 2020-03-31 | Disposition: A | Discharge status: Home / Self Care | Payer: BC Managed Care – PPO | Attending: Family Medicine | Admitting: Family Medicine

## 2020-03-31 ENCOUNTER — Encounter (HOSPITAL_COMMUNITY): Payer: Self-pay | Admitting: Orthopedic Surgery

## 2020-03-31 DIAGNOSIS — H1032 Unspecified acute conjunctivitis, left eye: Secondary | ICD-10-CM | POA: Diagnosis not present

## 2020-03-31 MED ORDER — POLYMYXIN B-TRIMETHOPRIM 10000-0.1 UNIT/ML-% OP SOLN: 1.0000 [drp] | OPHTHALMIC | 0 refills | Status: DC

## 2020-03-31 NOTE — ED Triage Notes (Signed)
Pt reports left eye was matted shut, with exudate and redness this morning.. No fever.

## 2020-03-31 NOTE — Discharge Instructions (Addendum)
Use eyedrops as prescribed Use for 3 to 5 days until redness is cleared Call or return for problems Wash hands frequently to prevent spread of infection

## 2020-03-31 NOTE — ED Provider Notes (Signed)
Alexandra Stein    CSN: [id number] Arrival date & time: 03/31/20  3419      History   Chief Complaint No chief complaint on file.   HPI Alexandra Stein is a 54 y.o. female.   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 She states they are "all over me". She has pinkeye. Is not surprising, she states that she periodically sees it in the children. She has no cough cold or runny nose symptoms. No photophobia or change in vision No sensation of foreign body  she is not a contact lens wearer  Past Medical History:  Diagnosis Date  . Bipolar 2 disorder ([place])   . GERD (gastroesophageal reflux disease)   . H/O chest pain    dx'd with GERD  . History of syncope 2002   Vasovagal  . Hyperlipidemia   . Migraines   . Potassium (K) deficiency    per pt K+ deficiency - ? from meds she takes ( none of which are diuretucs)  . SVD (spontaneous vaginal delivery)    x 2    Patient Active Problem List   Diagnosis Date Noted  . Acute respiratory disease due to COVID-19 virus 09/10/2019  . Obesity, Class III, BMI 40-49.9 (morbid obesity) ([place]) 09/10/2019  . Chest pain at rest 05/22/2019  . Attention deficit hyperactivity disorder (ADHD), predominantly inattentive type 09/18/2017  . Depression 01/18/2016  . Syncope 01/18/2016  . Hyperlipidemia   . GERD (gastroesophageal reflux disease)   . Alteration of consciousness   . Symptomatic cholelithiasis 07/26/2012  . Bipolar disorder ([place]) 07/26/2012  . Chest pain 12/12/2011  . Hypotension 12/12/2011    Past Surgical History:  Procedure Laterality Date  . CESAREAN SECTION     x 1  . CHOLECYSTECTOMY  07/27/2012   Procedure: LAPAROSCOPIC CHOLECYSTECTOMY;  Surgeon: Harl Bowie, MD;  Location: [facility];  Service: General;  Laterality: N/A;  . TUBAL LIGATION     x 2  . WISDOM TOOTH EXTRACTION      OB History   No obstetric history on file.      Home Medications    Prior to Admission medications    Medication Sig Start Date End Date Taking? Authorizing Provider  ARIPiprazole (ABILIFY) 15 MG tablet Take 15 mg by mouth at bedtime. 08/29/19  Yes [provider]  DULoxetine (CYMBALTA) 60 MG capsule Take 60 mg by mouth at bedtime.    Yes [provider]  furosemide (LASIX) 40 MG tablet Take 40 mg by mouth every morning. 08/29/19  Yes [provider]  naproxen (NAPROSYN) 500 MG tablet Take 500 mg by mouth 2 (two) times daily as needed. 11/01/19  Yes [provider]  OLANZapine (ZYPREXA) 10 MG tablet Take 10 mg by mouth at bedtime. 08/29/19  Yes [provider]  topiramate (TOPAMAX) 100 MG tablet Take 100 mg by mouth at bedtime.    Yes [provider]  VYVANSE 70 MG capsule Take 70 mg by mouth every morning. 02/03/20  Yes [provider]  acetaminophen (TYLENOL) 500 MG tablet Take 1,000 mg by mouth every 6 (six) hours as needed for moderate pain.    [provider]  trimethoprim-polymyxin b (POLYTRIM) ophthalmic solution Place 1 drop into both eyes every 4 (four) hours. 03/31/20   Raylene Everts, MD  omeprazole (PRILOSEC) 20 MG capsule Take 20 mg by mouth every morning. 07/18/19 03/31/20  [provider]    Family History Family History  Problem Relation Age of Onset  . Dilated cardiomyopathy Mother        with ICD  . Heart disease Mother   . Atrial fibrillation Mother   . Lung cancer Maternal Grandfather        black lung  . Cancer Maternal Grandfather   . Diabetes type II Father   . Cancer Father   . Kidney failure Father   . Dementia Maternal Grandmother   . Cancer Maternal Grandmother   . Stroke Maternal Grandmother   . Breast cancer Other   . Healthy Brother   . Healthy Child        x3    Social History Social History   Tobacco Use  . Smoking status: Never Smoker  . Smokeless tobacco: Never Used  Substance Use Topics  . Alcohol use: No  . Drug use: No     Allergies   Codeine    Review of Systems Review of Systems  Eyes: Positive for discharge and redness. Negative for photophobia and visual disturbance.     Physical Exam Triage Vital Signs ED Triage Vitals  Enc Vitals Group     BP --      Pulse Rate 03/31/20 0938 85     Resp 03/31/20 0938 18     Temp 03/31/20 0938 (!) 97.5 F (36.4 C)     Temp Source 03/31/20 0938 Oral     SpO2 03/31/20 0938 99 %     Weight --      Height --      Head Circumference --      Peak Flow --      Pain Score 03/31/20 0940 0     Pain Loc --      Pain Edu? --      Excl. in GC? --    No data found.  Updated Vital Signs Pulse 85   Temp (!) 97.5 F (36.4 C) (Oral)   Resp 18   LMP 08/18/2012   SpO2 99%      Physical Exam Constitutional:      General: She is not in acute distress.    Appearance: She is well-developed. She is obese.  HENT:     Head: Normocephalic and atraumatic.     Mouth/Throat:     Comments: Mask is in place Eyes:     Conjunctiva/sclera: Conjunctivae normal.     Pupils: Pupils are equal, round, and reactive to light.     Comments: Left eye has mild injection. Yellow crusting at the medial canthus. No foreign body  Cardiovascular:     Rate and Rhythm: Normal rate.  Pulmonary:     Effort: Pulmonary effort is normal. No respiratory distress.  Musculoskeletal:        General: Normal range of motion.     Cervical back: Normal range of motion.  Lymphadenopathy:     Cervical: No cervical adenopathy.  Skin:    General: Skin is warm and dry.  Neurological:     Mental Status: She is alert.  Psychiatric:        Mood and Affect: Mood normal.        Behavior: Behavior normal.      UC Treatments / Results  Labs (all labs ordered are listed, but only abnormal results are displayed) Labs Reviewed - No data to display  EKG   Radiology No results found.  Procedures Procedures (including critical care time)  Medications Ordered in UC Medications - No data to display  Initial  Impression  / Assessment and Plan / UC Course  I have reviewed the triage vital signs and the nursing notes.  Pertinent labs & imaging results that were available during my care of the patient were reviewed by me and considered in my medical decision making (see chart for details).     Treatment. Contagiousness Final Clinical Impressions(s) / UC Diagnoses   Final diagnoses:  Acute conjunctivitis of left eye, unspecified acute conjunctivitis type     Discharge Instructions     Use eyedrops as prescribed Use for 3 to 5 days until redness is cleared Call or return for problems Wash hands frequently to prevent spread of infection   ED Prescriptions    Medication Sig Dispense Auth. Provider   trimethoprim-polymyxin b (POLYTRIM) ophthalmic solution Place 1 drop into both eyes every 4 (four) hours. 10 mL Eustace Moore, MD     PDMP not reviewed this encounter.   Eustace Moore, MD 03/31/20 [phone]

## 2020-06-05 ENCOUNTER — Encounter (HOSPITAL_COMMUNITY): Payer: Self-pay | Admitting: Emergency Medicine

## 2020-06-05 ENCOUNTER — Emergency Department (HOSPITAL_COMMUNITY): Payer: BC Managed Care – PPO

## 2020-06-05 ENCOUNTER — Emergency Department (HOSPITAL_COMMUNITY)
Admission: EM | Admit: 2020-06-05 | Discharge: 2020-06-05 | Disposition: A | Discharge status: Home / Self Care | Payer: BC Managed Care – PPO | Attending: Emergency Medicine | Admitting: Emergency Medicine

## 2020-06-05 DIAGNOSIS — W1789XA Other fall from one level to another, initial encounter: Secondary | ICD-10-CM | POA: Diagnosis not present

## 2020-06-05 DIAGNOSIS — M25561 Pain in right knee: Secondary | ICD-10-CM | POA: Diagnosis not present

## 2020-06-05 DIAGNOSIS — M25551 Pain in right hip: Secondary | ICD-10-CM | POA: Diagnosis not present

## 2020-06-05 DIAGNOSIS — W19XXXA Unspecified fall, initial encounter: Secondary | ICD-10-CM

## 2020-06-05 DIAGNOSIS — S7001XA Contusion of right hip, initial encounter: Secondary | ICD-10-CM

## 2020-06-05 DIAGNOSIS — S8001XA Contusion of right knee, initial encounter: Secondary | ICD-10-CM

## 2020-06-05 MED ORDER — TRAMADOL HCL 50 MG PO TABS
50.0000 mg | ORAL_TABLET | Freq: Once | ORAL | Status: AC
  Administered 2020-06-05: 50 mg via ORAL
  Filled 2020-06-05: qty 1

## 2020-06-05 MED ORDER — METHOCARBAMOL 750 MG PO TABS: 750.0000 mg | ORAL_TABLET | Freq: Three times a day (TID) | ORAL | 0 refills | Status: AC | PRN

## 2020-06-05 MED ORDER — IBUPROFEN 800 MG PO TABS
800.0000 mg | ORAL_TABLET | Freq: Once | ORAL | Status: AC
  Administered 2020-06-05: 800 mg via ORAL
  Filled 2020-06-05: qty 1

## 2020-06-05 NOTE — ED Triage Notes (Signed)
Pt. 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 knee

## 2020-06-05 NOTE — ED Notes (Signed)
Patient transported to X-ray 

## 2020-06-05 NOTE — ED Notes (Addendum)
Patient states she is unable to ambulate. Will attempt after medication administration

## 2020-06-05 NOTE — Discharge Instructions (Addendum)
It was our pleasure to provide your ER care today - we hope that you feel better.  Take acetaminophen or ibuprofen as need for pain. You may also take robaxin as need for muscle pain/spasm - no driving when taking.    Follow up with primary care doctor in 1-2 weeks if symptoms fail to improve/resolve.  Return to ER if worse, new symptoms, new or severe pain, numbness/weakness, or other concern.   You were given pain medication in the ER - no driving for the next 6 hours.

## 2020-06-05 NOTE — ED Notes (Addendum)
Patient verbalizes understanding of discharge instructions. Opportunity for questioning and answers were provided. Armband removed by staff, pt discharged from ED ambulatory.   

## 2020-06-05 NOTE — ED Notes (Signed)
ice

## 2020-06-05 NOTE — ED Provider Notes (Addendum)
[facility] EMERGENCY DEPARTMENT Provider Note   CSN: [id number] Arrival date & time: 06/05/20  1227     History Chief Complaint  Patient presents with  . Fall  . Hip Pain  . Knee Pain    AISLIN ONOFRE is a 54 y.o. female.  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 Indicates was not very high, and her chair moved off edge of stage causing fall. No faintness or dizziness prior to fall. With fall, denies head injury, no loc, no headache. Pt c/o right hip and right knee pain post fall. Pain acute onset post fall, constant, dull, moderate, non radiating, worse w movement. Denies neck or back pain. No radicular pain. No numbness/weakness. Skin intact.   The history is provided by the patient.  Fall Pertinent negatives include no chest pain, no headaches and no shortness of breath.  Hip Pain Pertinent negatives include no chest pain, no headaches and no shortness of breath.  Knee Pain Associated symptoms: no back pain, no fever and no neck pain        Past Medical History:  Diagnosis Date  . Bipolar 2 disorder (HCC)   . GERD (gastroesophageal reflux disease)   . H/O chest pain    dx'd with GERD  . History of syncope 2002   Vasovagal  . Hyperlipidemia   . Migraines   . Potassium (K) deficiency    per pt K+ deficiency - ? from meds she takes ( none of which are diuretucs)  . SVD (spontaneous vaginal delivery)    x 2    Patient Active Problem List   Diagnosis Date Noted  . Acute respiratory disease due to COVID-19 virus 09/10/2019  . Obesity, Class III, BMI 40-49.9 (morbid obesity) (HCC) 09/10/2019  . Chest pain at rest 05/22/2019  . Attention deficit hyperactivity disorder (ADHD), predominantly inattentive type 09/18/2017  . Depression 01/18/2016  . Syncope 01/18/2016  . Hyperlipidemia   . GERD (gastroesophageal reflux disease)   . Alteration of consciousness   . Symptomatic cholelithiasis 07/26/2012  . Bipolar disorder  (HCC) 07/26/2012  . Chest pain 12/12/2011  . Hypotension 12/12/2011    Past Surgical History:  Procedure Laterality Date  . CESAREAN SECTION     x 1  . CHOLECYSTECTOMY  07/27/2012   Procedure: LAPAROSCOPIC CHOLECYSTECTOMY;  Surgeon: Shelly Rubenstein, MD;  Location: [facility];  Service: General;  Laterality: N/A;  . TUBAL LIGATION     x 2  . WISDOM TOOTH EXTRACTION       OB History   No obstetric history on file.     Family History  Problem Relation Age of Onset  . Dilated cardiomyopathy Mother        with ICD  . Heart disease Mother   . Atrial fibrillation Mother   . Lung cancer Maternal Grandfather        black lung  . Cancer Maternal Grandfather   . Diabetes type II Father   . Cancer Father   . Kidney failure Father   . Dementia Maternal Grandmother   . Cancer Maternal Grandmother   . Stroke Maternal Grandmother   . Breast cancer Other   . Healthy Brother   . Healthy Child        x3    Social History   Tobacco Use  . Smoking status: Never Smoker  . Smokeless tobacco: Never Used  Substance Use Topics  . Alcohol use: No  . Drug use: No  Home Medications Prior to Admission medications   Medication Sig Start Date End Date Taking? Authorizing Provider  acetaminophen (TYLENOL) 500 MG tablet Take 1,000 mg by mouth every 6 (six) hours as needed for moderate pain.    [provider]  ARIPiprazole (ABILIFY) 15 MG tablet Take 15 mg by mouth at bedtime. 08/29/19   [provider]  DULoxetine (CYMBALTA) 60 MG capsule Take 60 mg by mouth at bedtime.     [provider]  furosemide (LASIX) 40 MG tablet Take 40 mg by mouth every morning. 08/29/19   [provider]  naproxen (NAPROSYN) 500 MG tablet Take 500 mg by mouth 2 (two) times daily as needed. 11/01/19   [provider]  OLANZapine (ZYPREXA) 10 MG tablet Take 10 mg by mouth at bedtime. 08/29/19   [provider]  topiramate (TOPAMAX) 100 MG tablet Take 100 mg  by mouth at bedtime.     [provider]  trimethoprim-polymyxin b (POLYTRIM) ophthalmic solution Place 1 drop into both eyes every 4 (four) hours. 03/31/20   Eustace Moore, MD  VYVANSE 70 MG capsule Take 70 mg by mouth every morning. 02/03/20   [provider]  omeprazole (PRILOSEC) 20 MG capsule Take 20 mg by mouth every morning. 07/18/19 03/31/20  [provider]    Allergies    Codeine  Review of Systems   Review of Systems  Constitutional: Negative for fever.  HENT: Negative for nosebleeds.   Eyes: Negative for pain.  Respiratory: Negative for shortness of breath.   Cardiovascular: Negative for chest pain.  Gastrointestinal: Negative for nausea and vomiting.  Genitourinary: Negative for flank pain.  Musculoskeletal: Negative for back pain and neck pain.  Skin: Negative for wound.  Neurological: Negative for numbness and headaches.  Hematological: Does not bruise/bleed easily.  Psychiatric/Behavioral: Negative for confusion.    Physical Exam Updated Vital Signs BP (!) 115/64   Pulse 67   Temp 97.9 F (36.6 C)   Resp 15   LMP 08/18/2012   SpO2 99%   Physical Exam Vitals and nursing note reviewed.  Constitutional:      Appearance: Normal appearance. She is well-developed.  HENT:     Head: Atraumatic.     Nose: Nose normal.     Mouth/Throat:     Mouth: Mucous membranes are moist.  Eyes:     General: No scleral icterus.    Conjunctiva/sclera: Conjunctivae normal.  Neck:     Trachea: No tracheal deviation.  Cardiovascular:     Rate and Rhythm: Normal rate.     Pulses: Normal pulses.  Pulmonary:     Effort: Pulmonary effort is normal. No respiratory distress.  Chest:     Chest wall: No tenderness.  Abdominal:     General: There is no distension.     Palpations: Abdomen is soft.     Tenderness: There is no abdominal tenderness.  Genitourinary:    Comments: No cva tenderness.  Musculoskeletal:        General: No swelling.      Cervical back: Neck supple. No tenderness. No muscular tenderness.     Comments: CTLS spine, non tender, aligned, no step off. Tenderness right hip and right knee, otherwise good rom bil ext without pain or focal bony tenderness. Knee is stable, no effusion. Distal pulses palp bil.   Skin:    General: Skin is warm and dry.     Findings: No rash.  Neurological:     Mental Status: She  is alert.     Comments: Alert, speech normal. GCS 15. Motor/sensation grossly intact bil.   Psychiatric:        Mood and Affect: Mood normal.     ED Results / Procedures / Treatments   Labs (all labs ordered are listed, but only abnormal results are displayed) Labs Reviewed - No data to display  EKG None  Radiology DG Knee Complete 4 Views Right  Result Date: 06/05/2020 CLINICAL DATA:  Right knee pain after fall. EXAM: RIGHT KNEE - COMPLETE 4+ VIEW COMPARISON:  None. FINDINGS: No evidence of fracture, dislocation, or joint effusion. No evidence of arthropathy. Soft tissues are unremarkable. IMPRESSION: No significant abnormality seen in the right knee. Electronically Signed   By: Lupita Raider M.D.   On: 06/05/2020 14:33   DG HIP UNILAT W OR W/O PELVIS 2-3 VIEWS RIGHT  Result Date: 06/05/2020 CLINICAL DATA:  Fall with right hip pain. EXAM: DG HIP (WITH OR WITHOUT PELVIS) 2-3V RIGHT COMPARISON:  None. FINDINGS: There is no evidence of hip fracture or dislocation. There is no evidence of arthropathy or other focal bone abnormality. IMPRESSION: Negative. Electronically Signed   By: Marnee Spring M.D.   On: 06/05/2020 14:31    Procedures Procedures (including critical care time)  Medications Ordered in ED Medications  ibuprofen (ADVIL) tablet 800 mg (800 mg Oral Given 06/05/20 1323)    ED Course  I have reviewed the triage vital signs and the nursing notes.  Pertinent labs & imaging results that were available during my care of the patient were reviewed by me and considered in my medical decision  making (see chart for details).    MDM Rules/Calculators/A&P                          Imaging ordered.   Reviewed nursing notes and prior charts for additional history.   Xrays reviewed/interpreted by me -  No fx.  Ultram po. Po fluids.  Ambulate in hall.  Pt appears stable for d/c.    Final Clinical Impression(s) / ED Diagnoses Final diagnoses:  None    Rx / DC Orders ED Discharge Orders    None          Cathren Laine, MD 06/05/20 1521

## 2020-07-26 ENCOUNTER — Encounter (HOSPITAL_COMMUNITY): Payer: Self-pay | Admitting: Emergency Medicine

## 2020-07-26 ENCOUNTER — Emergency Department (HOSPITAL_COMMUNITY): Payer: BC Managed Care – PPO

## 2020-07-26 ENCOUNTER — Other Ambulatory Visit: Payer: Self-pay

## 2020-07-26 ENCOUNTER — Emergency Department (HOSPITAL_COMMUNITY)
Admission: EM | Admit: 2020-07-26 | Discharge: 2020-07-26 | Disposition: A | Discharge status: Home / Self Care | Payer: BC Managed Care – PPO | Attending: Emergency Medicine | Admitting: Emergency Medicine

## 2020-07-26 DIAGNOSIS — Z5321 Procedure and treatment not carried out due to patient leaving prior to being seen by health care provider: Secondary | ICD-10-CM | POA: Diagnosis not present

## 2020-07-26 DIAGNOSIS — R42 Dizziness and giddiness: Secondary | ICD-10-CM | POA: Diagnosis not present

## 2020-07-26 DIAGNOSIS — R110 Nausea: Secondary | ICD-10-CM | POA: Diagnosis not present

## 2020-07-26 DIAGNOSIS — R079 Chest pain, unspecified: Secondary | ICD-10-CM | POA: Insufficient documentation

## 2020-07-26 LAB — BASIC METABOLIC PANEL
Anion gap: 7 (ref 5–15)
BUN: 19 mg/dL (ref 6–20)
CO2: 24 mmol/L (ref 22–32)
Calcium: 9 mg/dL (ref 8.9–10.3)
Chloride: 109 mmol/L (ref 98–111)
Creatinine, Ser: 0.89 mg/dL (ref 0.44–1.00)
GFR calc Af Amer: >60 mL/min (ref >60)
GFR calc non Af Amer: >60 mL/min (ref >60)
Glucose, Bld: 103 mg/dL — ABNORMAL HIGH (ref 70–99)
Potassium: 5.4 mmol/L — ABNORMAL HIGH (ref 3.5–5.1)
Sodium: 140 mmol/L (ref 135–145)

## 2020-07-26 LAB — CBC
HCT: 42.2 % (ref 36.0–46.0)
Hemoglobin: 12.8 g/dL (ref 12.0–15.0)
MCH: 28.3 pg (ref 26.0–34.0)
MCHC: 30.3 g/dL (ref 30.0–36.0)
MCV: 93.2 fL (ref 80.0–100.0)
Platelets: 245 10*3/uL (ref 150–400)
RBC: 4.53 MIL/uL (ref 3.87–5.11)
RDW: 13.6 % (ref 11.5–15.5)
WBC: 6.4 10*3/uL (ref 4.0–10.5)
nRBC: 0 % (ref 0.0–0.2)

## 2020-07-26 LAB — I-STAT BETA HCG BLOOD, ED (MC, WL, AP ONLY): I-stat hCG, quantitative: <5 m[IU]/mL (ref <5)

## 2020-07-26 LAB — TROPONIN I (HIGH SENSITIVITY)
Troponin I (High Sensitivity): <2 ng/L (ref <18)
Troponin I (High Sensitivity): 4 ng/L (ref <18)

## 2020-07-26 NOTE — ED Notes (Addendum)
Pt brought back to triage for repeat Trop.  Reports intermittent chest pain.  Updated on wait for treatment room and pt requesting IV to be removed so she can go home.  Encouraged pt to wait to be seen and to atleast wait for repeat labs and she agreed.

## 2020-07-26 NOTE — ED Triage Notes (Signed)
Pt to triage via [facility] from home.  Reports chest pain, dizziness, and nausea that started while at church this morning.  20 g L FA, 3 NTG, 4 baby ASA, and O2 @ 2 liters by EMS.  Pain decreased from 8/10-2/10.

## 2020-07-26 NOTE — ED Notes (Signed)
IV removed, patient has been encouraged by this tech and Luciano Cutter RN to stay once her repeat troponin has come back. Patient refused to wait any longer and that husband was on the way.

## 2020-10-13 ENCOUNTER — Other Ambulatory Visit: Payer: Self-pay | Admitting: Family Medicine

## 2020-10-13 DIAGNOSIS — Z1231 Encounter for screening mammogram for malignant neoplasm of breast: Secondary | ICD-10-CM

## 2020-11-17 ENCOUNTER — Other Ambulatory Visit: Payer: BC Managed Care – PPO

## 2020-11-17 DIAGNOSIS — Z20822 Contact with and (suspected) exposure to covid-19: Secondary | ICD-10-CM

## 2020-11-19 ENCOUNTER — Ambulatory Visit: Payer: Self-pay | Admitting: *Deleted

## 2020-11-19 LAB — NOVEL CORONAVIRUS, NAA: SARS-CoV-2, NAA: DETECTED — AB

## 2020-11-19 LAB — SARS-COV-2, NAA 2 DAY TAT

## 2020-11-19 NOTE — Telephone Encounter (Signed)
Left message for her to call [place] back and left the number regarding her recent lab test.

## 2020-12-17 ENCOUNTER — Other Ambulatory Visit: Payer: Self-pay

## 2020-12-17 ENCOUNTER — Emergency Department (HOSPITAL_COMMUNITY)
Admission: EM | Admit: 2020-12-17 | Discharge: 2020-12-18 | Disposition: A | Discharge status: Home / Self Care | Payer: BC Managed Care – PPO | Attending: Emergency Medicine | Admitting: Emergency Medicine

## 2020-12-17 ENCOUNTER — Encounter (HOSPITAL_COMMUNITY): Payer: Self-pay | Admitting: Emergency Medicine

## 2020-12-17 DIAGNOSIS — R519 Headache, unspecified: Secondary | ICD-10-CM

## 2020-12-17 DIAGNOSIS — R202 Paresthesia of skin: Secondary | ICD-10-CM | POA: Insufficient documentation

## 2020-12-17 DIAGNOSIS — R200 Anesthesia of skin: Secondary | ICD-10-CM

## 2020-12-17 LAB — CBC WITH DIFFERENTIAL/PLATELET
Abs Immature Granulocytes: 0.01 10*3/uL (ref 0.00–0.07)
Basophils Absolute: 0 10*3/uL (ref 0.0–0.1)
Basophils Relative: 0 %
Eosinophils Absolute: 0.2 10*3/uL (ref 0.0–0.5)
Eosinophils Relative: 3 %
HCT: 38.1 % (ref 36.0–46.0)
Hemoglobin: 12.6 g/dL (ref 12.0–15.0)
Immature Granulocytes: 0 %
Lymphocytes Relative: 36 %
Lymphs Abs: 2.7 10*3/uL (ref 0.7–4.0)
MCH: 30.1 pg (ref 26.0–34.0)
MCHC: 33.1 g/dL (ref 30.0–36.0)
MCV: 90.9 fL (ref 80.0–100.0)
Monocytes Absolute: 0.5 10*3/uL (ref 0.1–1.0)
Monocytes Relative: 7 %
Neutro Abs: 4 10*3/uL (ref 1.7–7.7)
Neutrophils Relative %: 54 %
Platelets: 256 10*3/uL (ref 150–400)
RBC: 4.19 MIL/uL (ref 3.87–5.11)
RDW: 13.4 % (ref 11.5–15.5)
WBC: 7.4 10*3/uL (ref 4.0–10.5)
nRBC: 0 % (ref 0.0–0.2)

## 2020-12-17 LAB — BASIC METABOLIC PANEL
Anion gap: 8 (ref 5–15)
BUN: 23 mg/dL — ABNORMAL HIGH (ref 6–20)
CO2: 26 mmol/L (ref 22–32)
Calcium: 9.1 mg/dL (ref 8.9–10.3)
Chloride: 106 mmol/L (ref 98–111)
Creatinine, Ser: 0.94 mg/dL (ref 0.44–1.00)
GFR, Estimated: >60 mL/min (ref >60)
Glucose, Bld: 111 mg/dL — ABNORMAL HIGH (ref 70–99)
Potassium: 3.6 mmol/L (ref 3.5–5.1)
Sodium: 140 mmol/L (ref 135–145)

## 2020-12-17 LAB — URINALYSIS, ROUTINE W REFLEX MICROSCOPIC
Bilirubin Urine: NEGATIVE
Glucose, UA: NEGATIVE mg/dL
Hgb urine dipstick: NEGATIVE
Ketones, ur: NEGATIVE mg/dL
Leukocytes,Ua: NEGATIVE
Nitrite: NEGATIVE
Protein, ur: NEGATIVE mg/dL
Specific Gravity, Urine: 1.019 (ref 1.005–1.030)
pH: 6 (ref 5.0–8.0)

## 2020-12-17 NOTE — ED Triage Notes (Signed)
Patient reports intermittent headache with mild dizziness and numbness at both arms and legs onset last night , ambulatory , alert and oriented , denies pain /respirations unlabored.

## 2020-12-18 ENCOUNTER — Emergency Department (HOSPITAL_COMMUNITY): Payer: BC Managed Care – PPO

## 2020-12-18 MED ORDER — IOHEXOL 350 MG/ML SOLN
100.0000 mL | Freq: Once | INTRAVENOUS | Status: AC | PRN
  Administered 2020-12-18: 100 mL via INTRAVENOUS

## 2020-12-18 NOTE — ED Notes (Signed)
Patient transported to CT 

## 2020-12-18 NOTE — ED Provider Notes (Signed)
[facility]-EMERGENCY DEPT [facility] Emergency Department Provider Note MRN:  [id number]  Arrival date & time: 12/18/20     Chief Complaint   Headache/Dizzy   History of Present Illness   Alexandra Stein is a 55 y.o. year-old female with a history of bipolar disorder, GERD, migraines presenting to the ED with chief complaint of headache.  Patient endorses a sudden onset left-sided headache that occurred this morning at about 8 AM.  It was accompanied by bilateral arm and leg numbness and weakness.  Also with total body malaise, fatigue.  Patient crawled into bed and rested.  Woke up feeling back to normal.  Had a second episode this afternoon, again with sudden onset headache, arm and leg numbness and weakness.  Currently is back to normal.  Felt some mild lightheadedness when walking from waiting room to her ED room.  Denies any chest pain or shortness of breath, no abdominal pain, no bowel or bladder dysfunction.  Review of Systems  A complete 10 system review of systems was obtained and all systems are negative except as noted in the HPI and PMH.   Patient's Health History    Past Medical History:  Diagnosis Date  . Bipolar 2 disorder (HCC)   . GERD (gastroesophageal reflux disease)   . H/O chest pain    dx'd with GERD  . History of syncope 2002   Vasovagal  . Hyperlipidemia   . Migraines   . Potassium (K) deficiency    per pt K+ deficiency - ? from meds she takes ( none of which are diuretucs)  . SVD (spontaneous vaginal delivery)    x 2    Past Surgical History:  Procedure Laterality Date  . CESAREAN SECTION     x 1  . CHOLECYSTECTOMY  07/27/2012   Procedure: LAPAROSCOPIC CHOLECYSTECTOMY;  Surgeon: Shelly Rubenstein, MD;  Location: [facility];  Service: General;  Laterality: N/A;  . TUBAL LIGATION     x 2  . WISDOM TOOTH EXTRACTION      Family History  Problem Relation Age of Onset  . Dilated cardiomyopathy Mother        with ICD  . Heart disease Mother   . Atrial  fibrillation Mother   . Lung cancer Maternal Grandfather        black lung  . Cancer Maternal Grandfather   . Diabetes type II Father   . Cancer Father   . Kidney failure Father   . Dementia Maternal Grandmother   . Cancer Maternal Grandmother   . Stroke Maternal Grandmother   . Breast cancer Other   . Healthy Brother   . Healthy Child        x3    Social History   Socioeconomic History  . Marital status: Married    Spouse name: Not on file  . Number of children: 3  . Years of education: Not on file  . Highest education level: Not on file  Occupational History  . Occupation: Magazine features editor: [employer]  Tobacco Use  . Smoking status: Never Smoker  . Smokeless tobacco: Never Used  Substance and Sexual Activity  . Alcohol use: No  . Drug use: No  . Sexual activity: Never    Birth control/protection: Pill  Other Topics Concern  . Not on file  Social History Narrative   Lives in [place], [place] with husband.    Social Determinants of Health   Financial Resource Strain: Not on file  Food  Insecurity: Not on file  Transportation Needs: Not on file  Physical Activity: Not on file  Stress: Not on file  Social Connections: Not on file  Intimate Partner Violence: Not on file     Physical Exam   Vitals:   12/17/20 2256 12/18/20 0224  BP: 138/82 140/61  Pulse: 67 87  Resp: 18 16  Temp: 97.8 F (36.6 C) 97.7 F (36.5 C)  SpO2: 100% 100%    CONSTITUTIONAL: Well-appearing, NAD NEURO:  Alert and oriented x 3, no focal deficits EYES:  eyes equal and reactive ENT/NECK:  no LAD, no JVD CARDIO: Regular rate, well-perfused, normal S1 and S2 PULM:  CTAB no wheezing or rhonchi GI/GU:  normal bowel sounds, non-distended, non-tender MSK/SPINE:  No gross deformities, no edema SKIN:  no rash, atraumatic PSYCH:  Appropriate speech and behavior  *Additional and/or pertinent findings included in MDM below  Diagnostic and Interventional Summary    EKG  Interpretation  Date/Time:  Thursday December 17 2020 20:40:04 EST Ventricular Rate:  73 PR Interval:  160 QRS Duration: 96 QT Interval:  460 QTC Calculation: 506 R Axis:   49 Text Interpretation: Normal sinus rhythm Nonspecific ST and T wave abnormality Abnormal ECG Confirmed by Kennis Carina [phone]) on 12/18/2020 1:09:26 AM      Labs Reviewed  BASIC METABOLIC PANEL - Abnormal; Notable for the following components:      Result Value   Glucose, Bld 111 (*)    BUN 23 (*)    All other components within normal limits  URINALYSIS, ROUTINE W REFLEX MICROSCOPIC - Abnormal; Notable for the following components:   APPearance CLOUDY (*)    All other components within normal limits  CBC WITH DIFFERENTIAL/PLATELET    CT Angio Head W or Wo Contrast  Final Result    CT Angio Neck W and/or Wo Contrast  Final Result    CT Cervical Spine Wo Contrast  Final Result      Medications  iohexol (OMNIPAQUE) 350 MG/ML injection 100 mL (100 mLs Intravenous Contrast Given 12/18/20 0217)     Procedures  /  Critical Care Procedures  ED Course and Medical Decision Making  I have reviewed the triage vital signs, the nursing notes, and pertinent available records from the EMR.  Listed above are laboratory and imaging tests that I personally ordered, reviewed, and interpreted and then considered in my medical decision making (see below for details).  Given the sudden onset headache with report of a neurological deficit, subarachnoid hemorrhage is considered.  Also considering cervical spinal stenosis.  Awaiting CT imaging.  Patient is currently with a completely normal neurological exam, no headache.  Her symptoms were bilateral in nature and so this is inconsistent with acute ischemic stroke and so I feel that MRI imaging is not necessary this evening.  With a negative CT work-up I suspect patient would be able to be discharged with close follow-up.       Elmer Sow. Pilar Plate, MD [facility] Emergency  Medicine [facility] [email or web address]  Final Clinical Impressions(s) / ED Diagnoses     ICD-10-CM   1. Nonintractable headache, unspecified chronicity pattern, unspecified headache type  R51.9   2. Numbness and tingling of both upper extremities  R20.0 CANCELED: CT L-SPINE NO CHARGE   R20.2     ED Discharge Orders    None       Discharge Instructions Discussed with and Provided to Patient:     Discharge Instructions     You  were evaluated in the Emergency Department and after careful evaluation, we did not find any emergent condition requiring admission or further testing in the hospital.  Your exam/testing today was overall reassuring.  CT scans did not show any emergencies or abnormalities.  Please return to the Emergency Department if you experience any worsening of your condition.  Thank you for allowing [place] to be a part of your care.        Sabas Sous, MD 12/18/20 [phone]

## 2020-12-18 NOTE — Discharge Instructions (Addendum)
You were evaluated in the Emergency Department and after careful evaluation, we did not find any emergent condition requiring admission or further testing in the hospital.  Your exam/testing today was overall reassuring.  CT scans did not show any emergencies or abnormalities.  Please return to the Emergency Department if you experience any worsening of your condition.  Thank you for allowing [place] to be a part of your care.

## 2020-12-25 ENCOUNTER — Encounter: Payer: Self-pay | Admitting: Neurology

## 2021-01-02 ENCOUNTER — Ambulatory Visit
Admission: RE | Admit: 2021-01-02 | Discharge: 2021-01-02 | Disposition: A | Discharge status: Home / Self Care | Payer: BC Managed Care – PPO | Source: Ambulatory Visit | Attending: Family Medicine | Admitting: Family Medicine

## 2021-01-02 ENCOUNTER — Other Ambulatory Visit: Payer: Self-pay

## 2021-01-02 DIAGNOSIS — Z1231 Encounter for screening mammogram for malignant neoplasm of breast: Secondary | ICD-10-CM
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
## 2021-02-23 ENCOUNTER — Ambulatory Visit: Payer: BC Managed Care – PPO | Admitting: Neurology

## 2021-11-03 ENCOUNTER — Encounter (HOSPITAL_COMMUNITY): Payer: Self-pay | Admitting: Emergency Medicine

## 2021-11-03 ENCOUNTER — Emergency Department (HOSPITAL_COMMUNITY)
Admission: EM | Admit: 2021-11-03 | Discharge: 2021-11-03 | Disposition: A | Discharge status: Home / Self Care | Payer: BC Managed Care – PPO | Attending: Emergency Medicine | Admitting: Emergency Medicine

## 2021-11-03 ENCOUNTER — Emergency Department (HOSPITAL_COMMUNITY): Payer: BC Managed Care – PPO

## 2021-11-03 ENCOUNTER — Other Ambulatory Visit: Payer: Self-pay

## 2021-11-03 DIAGNOSIS — R0789 Other chest pain: Secondary | ICD-10-CM | POA: Diagnosis not present

## 2021-11-03 DIAGNOSIS — K219 Gastro-esophageal reflux disease without esophagitis: Secondary | ICD-10-CM | POA: Diagnosis not present

## 2021-11-03 DIAGNOSIS — Z8616 Personal history of COVID-19: Secondary | ICD-10-CM | POA: Insufficient documentation

## 2021-11-03 DIAGNOSIS — R079 Chest pain, unspecified: Secondary | ICD-10-CM

## 2021-11-03 DIAGNOSIS — R519 Headache, unspecified: Secondary | ICD-10-CM | POA: Diagnosis not present

## 2021-11-03 DIAGNOSIS — N9489 Other specified conditions associated with female genital organs and menstrual cycle: Secondary | ICD-10-CM | POA: Insufficient documentation

## 2021-11-03 LAB — CBC
HCT: 44.3 % (ref 36.0–46.0)
Hemoglobin: 13.8 g/dL (ref 12.0–15.0)
MCH: 28.8 pg (ref 26.0–34.0)
MCHC: 31.2 g/dL (ref 30.0–36.0)
MCV: 92.3 fL (ref 80.0–100.0)
Platelets: 251 10*3/uL (ref 150–400)
RBC: 4.8 MIL/uL (ref 3.87–5.11)
RDW: 13.7 % (ref 11.5–15.5)
WBC: 7.8 10*3/uL (ref 4.0–10.5)
nRBC: 0 % (ref 0.0–0.2)

## 2021-11-03 LAB — I-STAT BETA HCG BLOOD, ED (MC, WL, AP ONLY): I-stat hCG, quantitative: <5 m[IU]/mL (ref <5)

## 2021-11-03 LAB — BASIC METABOLIC PANEL
Anion gap: 6 (ref 5–15)
BUN: 19 mg/dL (ref 6–20)
CO2: 23 mmol/L (ref 22–32)
Calcium: 8.7 mg/dL — ABNORMAL LOW (ref 8.9–10.3)
Chloride: 112 mmol/L — ABNORMAL HIGH (ref 98–111)
Creatinine, Ser: 0.83 mg/dL (ref 0.44–1.00)
GFR, Estimated: >60 mL/min (ref >60)
Glucose, Bld: 83 mg/dL (ref 70–99)
Potassium: 3.7 mmol/L (ref 3.5–5.1)
Sodium: 141 mmol/L (ref 135–145)

## 2021-11-03 LAB — TROPONIN I (HIGH SENSITIVITY)
Troponin I (High Sensitivity): 2 ng/L (ref <18)
Troponin I (High Sensitivity): 3 ng/L (ref <18)

## 2021-11-03 NOTE — ED Provider Notes (Signed)
Emergency Medicine Provider Triage Evaluation Note  Alexandra Stein , a 55 y.o. female  was evaluated in triage.  Pt complains of non-radiating, 6-7/10, left sided chest pain onset today PTA. Feels like a weight on her chest. Her symptoms lasted 10 minutes went away and has been intermittent since. Has associated dizziness, nausea, resolved shortness of breath. No medications tried PTA. These symptoms have happened in the past. Denies fever, chills, vomiting. Denies history of HTN, DM, MI, CAD.  Review of Systems  Positive: Chest pain, dizziness, nausea Negative: Fever, chills  Physical Exam  BP (!) 167/114 (BP Location: Right Arm)    Pulse 67    Temp 97.8 F (36.6 C) (Oral)    Resp 18    Ht 5\' 2"  (1.575 m)    Wt 125 kg    LMP 08/18/2012    SpO2 100%    BMI 50.40 kg/m  Gen:   Awake, no distress   Resp:  Normal effort  MSK:   Moves extremities without difficulty  Other:  No chest wall TTP  Medical Decision Making  Medically screening exam initiated at 4:07 PM.  Appropriate orders placed.  10/18/2012 was informed that the remainder of the evaluation will be completed by another provider, this initial triage assessment does not replace that evaluation, and the importance of remaining in the ED until their evaluation is complete.   Alexandra Stein 11/03/21 1910    11/05/21, MD 11/03/21 [phone]

## 2021-11-03 NOTE — ED Triage Notes (Signed)
Reports chest pain, dizziness, HA, nausea and left arm pain for the past few years, is having a flare-up now. Once EMS arrived w/ pt she stated she only had nausea and dizziness.

## 2021-11-03 NOTE — ED Provider Notes (Signed)
[facility]-EMERGENCY DEPT Provider Note   CSN: [id number] Arrival date & time: 11/03/21  1442     History Chief Complaint  Patient presents with   Chest Pain   Headache   Dizziness    Alexandra Stein is a 55 y.o. female.  Patient presents with concern for chest pain.  She says she was driving in her car initially feeling fine and then had sudden onset chest pain describes it as a feeling of a brick on her chest wall lasted for about 5 to 10 minutes before resolving completely.  She states she also started getting headache about a 6 out of 10 in intensity and lightheadedness dizziness.  By the time she pulled over and about 5 to 10 minutes with her symptoms had completely resolved.  She had similar episode a few months ago which lasted about the same amount of time.  She states she was seen by her physicians and evaluated with a negative work-up but has not yet seen cardiology.  She otherwise denies any fevers or cough or vomiting or diarrhea currently without any symptoms.      Past Medical History:  Diagnosis Date   Bipolar 2 disorder (HCC)    GERD (gastroesophageal reflux disease)    H/O chest pain    dx'd with GERD   History of syncope 2002   Vasovagal   Hyperlipidemia    Migraines    Potassium (K) deficiency    per pt K+ deficiency - ? from meds she takes ( none of which are diuretucs)   SVD (spontaneous vaginal delivery)    x 2    Patient Active Problem List   Diagnosis Date Noted   Acute respiratory disease due to COVID-19 virus 09/10/2019   Obesity, Class III, BMI 40-49.9 (morbid obesity) (HCC) 09/10/2019   Chest pain at rest 05/22/2019   Attention deficit hyperactivity disorder (ADHD), predominantly inattentive type 09/18/2017   Depression 01/18/2016   Syncope 01/18/2016   Hyperlipidemia    GERD (gastroesophageal reflux disease)    Alteration of consciousness    Symptomatic cholelithiasis 07/26/2012   Bipolar disorder (HCC) 07/26/2012    Chest pain 12/12/2011   Hypotension 12/12/2011    Past Surgical History:  Procedure Laterality Date   CESAREAN SECTION     x 1   CHOLECYSTECTOMY  07/27/2012   Procedure: LAPAROSCOPIC CHOLECYSTECTOMY;  Surgeon: Shelly Rubenstein, MD;  Location: [facility];  Service: General;  Laterality: N/A;   TUBAL LIGATION     x 2   WISDOM TOOTH EXTRACTION       OB History   No obstetric history on file.     Family History  Problem Relation Age of Onset   Dilated cardiomyopathy Mother        with ICD   Heart disease Mother    Atrial fibrillation Mother    Lung cancer Maternal Grandfather        black lung   Cancer Maternal Grandfather    Diabetes type II Father    Cancer Father    Kidney failure Father    Dementia Maternal Grandmother    Cancer Maternal Grandmother    Stroke Maternal Grandmother    Breast cancer Other    Healthy Brother    Healthy Child        x3    Social History   Tobacco Use   Smoking status: Never   Smokeless tobacco: Never  Substance Use Topics   Alcohol use: No  Drug use: No    Home Medications Prior to Admission medications   Medication Sig Start Date End Date Taking? Authorizing Provider  acetaminophen (TYLENOL) 500 MG tablet Take 1,000 mg by mouth every 6 (six) hours as needed for moderate pain.    [provider]  ARIPiprazole (ABILIFY) 15 MG tablet Take 15 mg by mouth at bedtime. 08/29/19   [provider]  DULoxetine (CYMBALTA) 60 MG capsule Take 60 mg by mouth at bedtime.     [provider]  furosemide (LASIX) 40 MG tablet Take 40 mg by mouth every morning. 08/29/19   [provider]  methocarbamol (ROBAXIN) 750 MG tablet Take 1 tablet (750 mg total) by mouth 3 (three) times daily as needed (muscle spasm/pain). 06/05/20   Cathren Laine, MD  naproxen (NAPROSYN) 500 MG tablet Take 500 mg by mouth 2 (two) times daily as needed. 11/01/19   [provider]  OLANZapine (ZYPREXA) 10 MG tablet Take 10 mg  by mouth at bedtime. 08/29/19   [provider]  topiramate (TOPAMAX) 100 MG tablet Take 100 mg by mouth at bedtime.     [provider]  trimethoprim-polymyxin b (POLYTRIM) ophthalmic solution Place 1 drop into both eyes every 4 (four) hours. 03/31/20   Eustace Moore, MD  VYVANSE 70 MG capsule Take 70 mg by mouth every morning. 02/03/20   [provider]  omeprazole (PRILOSEC) 20 MG capsule Take 20 mg by mouth every morning. 07/18/19 03/31/20  [provider]    Allergies    Codeine  Review of Systems   Review of Systems  Constitutional:  Negative for fever.  HENT:  Negative for ear pain.   Eyes:  Negative for pain.  Respiratory:  Negative for cough.   Cardiovascular:  Positive for chest pain.  Gastrointestinal:  Negative for abdominal pain.  Genitourinary:  Negative for flank pain.  Musculoskeletal:  Negative for back pain.  Skin:  Negative for rash.  Neurological:  Positive for headaches.   Physical Exam Updated Vital Signs BP 132/82    Pulse 81    Temp 97.8 F (36.6 C) (Oral)    Resp 13    Ht 5\' 2"  (1.575 m)    Wt 125 kg    LMP 08/18/2012    SpO2 97%    BMI 50.40 kg/m   Physical Exam Constitutional:      General: She is not in acute distress.    Appearance: Normal appearance.  HENT:     Head: Normocephalic.     Nose: Nose normal.  Eyes:     Extraocular Movements: Extraocular movements intact.  Cardiovascular:     Rate and Rhythm: Normal rate.  Pulmonary:     Effort: Pulmonary effort is normal.  Musculoskeletal:        General: Normal range of motion.     Cervical back: Normal range of motion.  Neurological:     General: No focal deficit present.     Mental Status: She is alert. Mental status is at baseline.    ED Results / Procedures / Treatments   Labs (all labs ordered are listed, but only abnormal results are displayed) Labs Reviewed  BASIC METABOLIC PANEL - Abnormal; Notable for the following components:      Result  Value   Chloride 112 (*)    Calcium 8.7 (*)    All other components within normal limits  CBC  I-STAT BETA HCG BLOOD, ED ([facility], [facility], [facility] ONLY)  TROPONIN I (HIGH  SENSITIVITY)  TROPONIN I (HIGH SENSITIVITY)    EKG EKG Interpretation  Date/Time:  Wednesday November 03 2021 16:19:38 EST Ventricular Rate:  78 PR Interval:  164 QRS Duration: 100 QT Interval:  474 QTC Calculation: 540 R Axis:   33 Text Interpretation: Sinus rhythm Borderline T abnormalities, anterior leads Prolonged QT interval Confirmed by Norman Clay ([id number]) on 11/03/2021 8:55:07 PM  Radiology DG Chest 2 View  Result Date: 11/03/2021 CLINICAL DATA:  Chest pain EXAM: CHEST - 2 VIEW COMPARISON:  07/26/2020 FINDINGS: The heart size and mediastinal contours are within normal limits. Both lungs are clear. The visualized skeletal structures are unremarkable. IMPRESSION: No active cardiopulmonary disease. Electronically Signed   By: Jasmine Pang M.D.   On: 11/03/2021 15:21    Procedures Procedures   Medications Ordered in ED Medications - No data to display  ED Course  I have reviewed the triage vital signs and the nursing notes.  Pertinent labs & imaging results that were available during my care of the patient were reviewed by me and considered in my medical decision making (see chart for details).    MDM Rules/Calculators/A&P                         Observed for several hours in the ER with no additional adverse events.  EKG shows sinus rhythm no ST ovation depressions normal rate. Troponin is 2.  Repeat troponin remains similarly unchanged.  Patient remains asymptomatic, will be discharged home advised avoidance of strenuous activity.  Advised daily aspirin 81 mg and advised follow-up with cardiology within this week.  Ambulatory referral provided for the patient.  However advised immediate return back to the ER should she have any recurrent chest pain or any additional concerns.    Final Clinical  Impression(s) / ED Diagnoses Final diagnoses:  Chest pain, unspecified type    Rx / DC Orders ED Discharge Orders          Ordered    Ambulatory referral to Cardiology        11/03/21 2318             Cheryll Cockayne, MD 11/03/21 [id number]

## 2021-11-03 NOTE — Discharge Instructions (Signed)
Take daily aspirin 81 mg.  Avoid strenuous activity.  Call the cardiology group listed if you do not hear from them within the next 3 to 4 days.  While you are waiting, Return immediately back to the ER if:  Your symptoms worsen within the next 12-24 hours. You develop new symptoms such as new fevers, persistent vomiting, new pain, shortness of breath, or new weakness or numbness, or if you have any other concerns.

## 2021-12-15 DIAGNOSIS — R400 Somnolence: Secondary | ICD-10-CM | POA: Insufficient documentation

## 2022-02-01 ENCOUNTER — Encounter: Payer: Self-pay | Admitting: Physician Assistant

## 2022-02-01 ENCOUNTER — Ambulatory Visit: Payer: BC Managed Care – PPO | Admitting: Physician Assistant

## 2022-02-01 ENCOUNTER — Other Ambulatory Visit: Payer: Self-pay

## 2022-02-01 VITALS — BP 141/75 | HR 77 | Resp 18 | Ht 62.0 in | Wt 274.0 lb

## 2022-02-01 DIAGNOSIS — R413 Other amnesia: Secondary | ICD-10-CM | POA: Insufficient documentation

## 2022-02-01 DIAGNOSIS — G4730 Sleep apnea, unspecified: Secondary | ICD-10-CM

## 2022-02-01 NOTE — Progress Notes (Signed)
? ? ?Assessment/Plan:  ? ?Alexandra Stein is a very pleasant 56 y.o. year old RH female with  a history of bipolar disorder, ADHD, depression, Vit Deficiency, prediabetes with BLE paresthesias, history of migraine headaches, insomnia, h/o prolonged QT interval, hyperlipidemia, anxiety, depression seen today for evaluation of memory loss. MoCA today is 26/30 with delayed recall 2/5. Possible history of OSA. ? ? ? Recommendations:  ? ?Memory Loss of unclear etiology ? ?MRI brain with/without contrast to assess for underlying structural abnormality and assess vascular load  ?Replenish B12 and Vit D ?Sleep study referral for OSA  ?Discussed safety both in and out of the home.  ?Discussed the importance of regular daily schedule with inclusion of crossword puzzles to maintain brain function.  ?Consider adjusting psych meds by PCP/ psych ?Stay active at least 30 minutes at least 3 times a week.  ?Naps should be scheduled and should be no longer than 60 minutes and should not occur after 2 PM.  ?Control cardiovascular risk factors ?Mediterranean diet is recommended  ?Folllow up once results above are available  ? ?Subjective:  ? ? ?The patient is seen in neurologic consultation at the request of Aliene Beams, MD for the evaluation of memory. The patient is here alone ?This is a 56 y.o. year old RH  female who has had memory issues for about 7 or 8 years, initially evaluated at our office, when normal testing and exam.  Over the last 2 or 3 months, the patient has been feeling more "foggy headed, I feel off ".  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  During the school break, she did not report any issues.  She states that her memory problems are worse when she wakes up, but she has noticed that lately she had to have a piece of paper with her all day long, has to make a list and look at it frequently.  She reports repeating  herself "constantly, and I talk to myself ".  Her husband has noticed changes as well, agreeing that she repeats herself. She denies being disoriented when walking into her room, but she has been placing some objects in unusual places, such as keys on the fridge 2 weeks ago.  She reports constantly losing things, the other day she lost her wallet, and it was in the glove compartment.  She denies feeling more distracted than before, she is not aware if the ADHD has worsened.  She started to workout, with the goal of losing 120 pounds.  At this moment, she is morbidly obese, and she has significant issues in her joints, and mobility, so that she has made the decision to "take matters into the hands ".  She has some balance issues as well, but denies any vertigo.  She only recalls 1 major fall, mechanical in July 2021.  She denies any head injuries.  She reports driving only for 30 minutes or less, if she drives any larger distances, she will become somnolent.  She has never been tested for sleep apnea, she does report snoring at night.  She feels very tired during the day, not rested.  She denies vivid dreams or sleepwalking.  She lives with her husband.  Her mood is stable, denies any worsening depression or irritability.  She likes to read, but she does have to reread sometimes they material in order to remember it.  She denies any  hallucinations or paranoia.  There are no hygiene concerns although sometimes she does not feel like taking a shower "although I do take it ".  Her medications are in a pillbox, she denies missing any doses.  Her appetite is good, denies burning the still when cooking.  She does have some trouble swallowing, described as "I am choking on solids for the last 6 months ", and is to be evaluated by GI soon.  She is in charge of her finances.  She denies any focal numbness or tingling, unilateral weakness, tremors or anosmia.  She denies any history of seizures.SHe has a history of occasional  migraines, none recently.  Denies urine retention.  She has a history of urine incontinence.  She also has a history of diarrhea especially over the last 2 weeks, which is also going to be evaluated by GI.  She denies a history of alcohol or tobacco.  Family history remarkable for mother with Alzheimer's disease, dying at 2080, and father who died with dementia in his 4080s. ?  ?EKG Interpretation ?  ?Date/Time:                  Wednesday November 03 2021 16:19:38 EST ?Ventricular Rate:         78 ?PR Interval:                 164 ?QRS Duration: 100 ?QT Interval:                 474 ?QTC Calculation:        540 ?R Axis:                         33 ?Text Interpretation:      Sinus rhythm Borderline T abnormalities, anterior leads Prolonged QT interval Confirmed by Norman ClayHong, Joshua ([id number]) on 11/03/2021 8:55:07 PM ? ?Vit D 32.6 ?T4  8.2  ?TSH 1.7  ?CMP nl , CBC nl  ?B12  290 ? ?Allergies  ?Allergen Reactions  ? Codeine Other (See Comments)  ?  Hallucinations and fever. Pt CAN take Vicodin and Percocet.  ? ? ?Current Outpatient Medications  ?Medication Instructions  ? acetaminophen (TYLENOL) 1,000 mg, Oral, Every 6 hours PRN  ? ARIPiprazole (ABILIFY) 15 mg, Oral, Daily at bedtime  ? DULoxetine (CYMBALTA) 60 mg, Daily at bedtime  ? furosemide (LASIX) 40 mg, Oral, Every morning  ? methocarbamol (ROBAXIN) 750 mg, Oral, 3 times daily PRN  ? naproxen (NAPROSYN) 500 mg, Oral, 2 times daily PRN  ? OLANZapine (ZYPREXA) 10 mg, Oral, Daily at bedtime  ? topiramate (TOPAMAX) 100 mg, Daily at bedtime  ? trimethoprim-polymyxin b (POLYTRIM) ophthalmic solution 1 drop, Both Eyes, Every 4 hours  ? Vyvanse 70 mg, Oral, Every morning  ? ? ? ?VITALS:   ?Vitals:  ? 02/01/22 0804  ?BP: (!) 141/75  ?Pulse: 77  ?Resp: 18  ?Weight: 274 lb (124.3 kg)  ?Height: 5\' 2"  (1.575 m)  ?HC: 18" (45.7 cm)  ? ?   ? View : No data to display.  ?  ?  ?  ? ? ?PHYSICAL EXAM  ? ?HEENT:  Normocephalic, atraumatic. The mucous membranes are moist. The superficial temporal  arteries are without ropiness or tenderness. ?Cardiovascular: Regular rate and rhythm. ?Lungs: Clear to auscultation bilaterally. ?Neck: There are no carotid bruits noted bilaterally. ? ?NEUROLOGICAL: ? ?  03/03/2016  ?  9:00 AM  ?Montreal Cognitive Assessment   ?Visuospatial/ Executive (0/5) 4  ?  Naming (0/3) 3  ?Attention: Read list of digits (0/2) 2  ?Attention: Read list of letters (0/1) 1  ?Attention: Serial 7 subtraction starting at 100 (0/3) 3  ?Language: Repeat phrase (0/2) 2  ?Language : Fluency (0/1) 1  ?Abstraction (0/2) 2  ?Delayed Recall (0/5) 2  ?Orientation (0/6) 5  ?Total 25  ?Adjusted Score (based on education) 25  ?  ?   ? View : No data to display.  ?  ?  ?  ?  ? ?Orientation:  Alert and oriented to person, place and time. No aphasia or dysarthria. Fund of knowledge is appropriate. Recent memory impaired and remote memory intact.  Attention and concentration are normal.  Able to name objects and repeat phrases. Delayed recall 2/5  ?Cranial nerves: There is good facial symmetry. Extraocular muscles are intact and visual fields are full to confrontational testing. Speech is fluent and clear. Soft palate rises symmetrically and there is no tongue deviation. Hearing is intact to conversational tone. ?Tone: Tone is good throughout. ?Sensation: Sensation is intact to light touch and pinprick throughout. Vibration is intact at the bilateral big toe.There is no extinction with double simultaneous stimulation. There is no sensory dermatomal level identified. ?Coordination: The patient has no difficulty with RAM's or FNF bilaterally. Normal finger to nose  ?Motor: Strength is 5/5 in the bilateral upper and lower extremities. There is no pronator drift. There are no fasciculations noted. ?DTR's: Deep tendon reflexes are 2/4 at the bilateral biceps, triceps, brachioradialis, patella and achilles.  Plantar responses are downgoing bilaterally. ?Gait and Station: The patient is able to ambulate without  difficulty.The patient is able to heel toe walk without any difficulty.The patient is able to ambulate in a tandem fashion. The patient is able to stand in the Romberg position. ?  ? ? ?Thank you for allowing [place]

## 2022-02-01 NOTE — Patient Instructions (Addendum)
It was a pleasure to see you today at our office.  ? ?Recommendations: ? ?MRI of the brain, the radiology office will call you to arrange you appointment ?Referral for sleep study for sleep apnea ?Replenish B12 and D ?Drink more water  ?Consider adjusting the psych medications for ADHD  ?Follow up  61month  ? ?RECOMMENDATIONS FOR ALL PATIENTS WITH MEMORY PROBLEMS: ?1. Continue to exercise (Recommend 30 minutes of walking everyday, or 3 hours every week) ?2. Increase social interactions - continue going to [place] and enjoy social gatherings with friends and family ?3. Eat healthy, avoid fried foods and eat more fruits and vegetables ?4. Maintain adequate blood pressure, blood sugar, and blood cholesterol level. Reducing the risk of stroke and cardiovascular disease also helps promoting better memory. ?5. Avoid stressful situations. Live a simple life and avoid aggravations. Organize your time and prepare for the next day in anticipation. ?6. Sleep well, avoid any interruptions of sleep and avoid any distractions in the bedroom that may interfere with adequate sleep quality ?7. Avoid sugar, avoid sweets as there is a strong link between excessive sugar intake, diabetes, and cognitive impairment ?We discussed the Mediterranean diet, which has been shown to help patients reduce the risk of progressive memory disorders and reduces cardiovascular risk. This includes eating fish, eat fruits and green leafy vegetables, nuts like almonds and hazelnuts, walnuts, and also use olive oil. Avoid fast foods and fried foods as much as possible. Avoid sweets and sugar as sugar use has been linked to worsening of memory function. ? ?There is always a concern of gradual progression of memory problems. If this is the case, then we may need to adjust level of care according to patient needs. Support, both to the patient and caregiver, should then be put into place.  ? ? ? ? ? ?FALL PRECAUTIONS: Be cautious when walking. Scan the area for  obstacles that may increase the risk of trips and falls. When getting up in the mornings, sit up at the edge of the bed for a few minutes before getting out of bed. Consider elevating the bed at the head end to avoid drop of blood pressure when getting up. Walk always in a well-lit room (use night lights in the walls). Avoid area rugs or power cords from appliances in the middle of the walkways. Use a walker or a cane if necessary and consider physical therapy for balance exercise. Get your eyesight checked regularly. ? ?FINANCIAL OVERSIGHT: Supervision, especially oversight when making financial decisions or transactions is also recommended. ? ?HOME SAFETY: Consider the safety of the kitchen when operating appliances like stoves, microwave oven, and blender. Consider having supervision and share cooking responsibilities until no longer able to participate in those. Accidents with firearms and other hazards in the house should be identified and addressed as well. ? ? ?ABILITY TO BE LEFT ALONE: If patient is unable to contact 911 operator, consider using LifeLine, or when the need is there, arrange for someone to stay with patients. Smoking is a fire hazard, consider supervision or cessation. Risk of wandering should be assessed by caregiver and if detected at any point, supervision and safe proof recommendations should be instituted. ? ?MEDICATION SUPERVISION: Inability to self-administer medication needs to be constantly addressed. Implement a mechanism to ensure safe administration of the medications. ? ? ?DRIVING: Regarding driving, in patients with progressive memory problems, driving will be impaired. We advise to have someone else do the driving if trouble finding directions or if minor  accidents are reported. Independent driving assessment is available to determine safety of driving. ? ? ?If you are interested in the driving assessment, you can contact the following: ? ?[facility] [employer] in [place] ? ?[place] [phone] ? ?[facility] [phone] ? ?[facility] [phone] or [phone] ? ? ? ?Mediterranean Diet ?A Mediterranean diet refers to food and lifestyle choices that are based on the traditions of countries located on the [employer]. This way of eating has been shown to help prevent certain conditions and improve outcomes for people who have chronic diseases, like kidney disease and heart disease. ?What are tips for following this plan? ?Lifestyle  ?Cook and eat meals together with your family, when possible. ?Drink enough fluid to keep your urine clear or pale yellow. ?Be physically active every day. This includes: ?Aerobic exercise like running or swimming. ?Leisure activities like gardening, walking, or housework. ?Get 7-8 hours of sleep each night. ?If recommended by your health care provider, drink red wine in moderation. This means 1 glass a day for nonpregnant women and 2 glasses a day for men. A glass of wine equals 5 oz (150 mL). ?Reading food labels  ?Check the serving size of packaged foods. For foods such as rice and pasta, the serving size refers to the amount of cooked product, not dry. ?Check the total fat in packaged foods. Avoid foods that have saturated fat or trans fats. ?Check the ingredients list for added sugars, such as corn syrup. ?Shopping  ?At the grocery store, buy most of your food from the areas near the walls of the store. This includes: ?Fresh fruits and vegetables (produce). ?Grains, beans, nuts, and seeds. Some of these may be available in unpackaged forms or large amounts (in bulk). ?Fresh seafood. ?Poultry and eggs. ?Low-fat dairy products. ?Buy whole ingredients instead of prepackaged foods. ?Buy fresh fruits and vegetables in-season from local farmers markets. ?Buy frozen fruits and vegetables in resealable bags. ?If you do not have access to quality fresh seafood, buy precooked frozen shrimp or canned  fish, such as tuna, salmon, or sardines. ?Buy small amounts of raw or cooked vegetables, salads, or olives from the deli or salad bar at your store. ?Stock your pantry so you always have certain foods on hand, such as olive oil, canned tuna, canned tomatoes, rice, pasta, and beans. ?Cooking  ?Cook foods with extra-virgin olive oil instead of using butter or other vegetable oils. ?Have meat as a side dish, and have vegetables or grains as your main dish. This means having meat in small portions or adding small amounts of meat to foods like pasta or stew. ?Use beans or vegetables instead of meat in common dishes like chili or lasagna. ?Experiment with different cooking methods. Try roasting or broiling vegetables instead of steaming or saut?eing them. ?Add frozen vegetables to soups, stews, pasta, or rice. ?Add nuts or seeds for added healthy fat at each meal. You can add these to yogurt, salads, or vegetable dishes. ?Marinate fish or vegetables using olive oil, lemon juice, garlic, and fresh herbs. ?Meal planning  ?Plan to eat 1 vegetarian meal one day each week. Try to work up to 2 vegetarian meals, if possible. ?Eat seafood 2 or more times a week. ?Have healthy snacks readily available, such as: ?Vegetable sticks with hummus. ?[place] yogurt. ?Fruit and nut trail mix. ?Eat balanced meals throughout the week. This includes: ?Fruit: 2-3 servings a day ?Vegetables: 4-5 servings a day ?Low-fat dairy: 2 servings a day ?  Fish, poultry, or lean meat: 1 serving a day ?Beans and legumes: 2 or more servings a week ?Nuts and seeds: 1-2 servings a day ?Whole grains: 6-8 servings a day ?Extra-virgin olive oil: 3-4 servings a day ?Limit red meat and sweets to only a few servings a month ?What are my food choices? ?Mediterranean diet ?Recommended ?Grains: Whole-grain pasta. Brown rice. Bulgar wheat. Polenta. Couscous. Whole-wheat bread. [place]. ?Vegetables: Artichokes. Beets. Broccoli. Cabbage. Carrots. Eggplant. Green  beans. Chard. Kale. Spinach. Onions. Leeks. Peas. Squash. Tomatoes. Peppers. Radishes. ?Fruits: Apples. Apricots. Avocado. Berries. Bananas. Cherries. Dates. Figs. Grapes. Lemons. Melon. Oranges. Peaches

## 2022-03-03 ENCOUNTER — Ambulatory Visit: Payer: BC Managed Care – PPO | Admitting: Physician Assistant

## 2022-03-07 ENCOUNTER — Ambulatory Visit
Admission: RE | Admit: 2022-03-07 | Discharge: 2022-03-07 | Disposition: A | Discharge status: Home / Self Care | Payer: BC Managed Care – PPO | Source: Ambulatory Visit | Attending: Physician Assistant | Admitting: Physician Assistant

## 2022-03-07 MED ORDER — GADOBENATE DIMEGLUMINE 529 MG/ML IV SOLN
20.0000 mL | Freq: Once | INTRAVENOUS | Status: AC | PRN
  Administered 2022-03-07: 20 mL via INTRAVENOUS

## 2022-03-07 NOTE — Progress Notes (Signed)
MRI of the brain is normal! It is stable since 2017. No abnormalities from neurological standpoint to explain her memory changes. MAy need to look into ADD/ADHD causes, possible OSA, vit 12 and D deficiencies.

## 2022-03-11 ENCOUNTER — Encounter: Payer: Self-pay | Admitting: Physician Assistant

## 2022-04-04 ENCOUNTER — Emergency Department (HOSPITAL_BASED_OUTPATIENT_CLINIC_OR_DEPARTMENT_OTHER)
Admission: EM | Admit: 2022-04-04 | Discharge: 2022-04-04 | Disposition: A | Discharge status: Home / Self Care | Payer: BC Managed Care – PPO | Attending: Emergency Medicine | Admitting: Emergency Medicine

## 2022-04-04 ENCOUNTER — Other Ambulatory Visit: Payer: Self-pay

## 2022-04-04 ENCOUNTER — Encounter (HOSPITAL_BASED_OUTPATIENT_CLINIC_OR_DEPARTMENT_OTHER): Payer: Self-pay

## 2022-04-04 DIAGNOSIS — B349 Viral infection, unspecified: Secondary | ICD-10-CM | POA: Insufficient documentation

## 2022-04-04 DIAGNOSIS — J029 Acute pharyngitis, unspecified: Secondary | ICD-10-CM | POA: Diagnosis present

## 2022-04-04 DIAGNOSIS — H1089 Other conjunctivitis: Secondary | ICD-10-CM | POA: Insufficient documentation

## 2022-04-04 DIAGNOSIS — H1031 Unspecified acute conjunctivitis, right eye: Secondary | ICD-10-CM

## 2022-04-04 MED ORDER — CIPROFLOXACIN HCL 0.3 % OP SOLN
1.0000 [drp] | OPHTHALMIC | Status: DC
  Filled 2022-04-04: qty 2.5

## 2022-04-04 MED ORDER — CIPROFLOXACIN HCL 0.3 % OP SOLN
1.0000 [drp] | Freq: Once | OPHTHALMIC | Status: AC
  Administered 2022-04-04: 1 [drp] via OPHTHALMIC

## 2022-04-04 NOTE — ED Triage Notes (Signed)
Reports right eye redness. Dx with pink eye. Started using Erythromycin and pain has got worse.   Pt sts she has been able to see until today and now barely see out of either eye. Wears contacts but it feels different.   Dizziness that began tonight. Alert, oriented and ambulatory to treatment room.

## 2022-04-04 NOTE — ED Provider Notes (Signed)
[facility] EMERGENCY DEPT  Provider Note  CSN: [id number] Arrival date & time: 04/04/22 0310  History Chief Complaint  Patient presents with   Eye Pain   Dizziness    Alexandra Stein is a 56 y.o. female here with two complaints. First is R eye itching, redness and drainage. Similar to prior conjunctivitis, called PCP and given Rx for erythromycin ointment which has not helped, now also spread to L eye. She is a contact lens wearer, has her contacts out. She reports blurry vision because she doesn't have her contacts in. Does not have a pair of glasses.   Additionally, in the last few hours, she has had sore throat, body aches, swimmy headedness, but no fever cough or vomiting.    Home Medications Prior to Admission medications   Medication Sig Start Date End Date Taking? Authorizing Provider  acetaminophen (TYLENOL) 500 MG tablet Take 1,000 mg by mouth every 6 (six) hours as needed for moderate pain.    [provider]  ARIPiprazole (ABILIFY) 15 MG tablet Take 15 mg by mouth at bedtime. 08/29/19   [provider]  DULoxetine (CYMBALTA) 60 MG capsule Take 60 mg by mouth at bedtime.     [provider]  furosemide (LASIX) 40 MG tablet Take 40 mg by mouth every morning. 08/29/19   [provider]  methocarbamol (ROBAXIN) 750 MG tablet Take 1 tablet (750 mg total) by mouth 3 (three) times daily as needed (muscle spasm/pain). 06/05/20   Cathren Laine, MD  naproxen (NAPROSYN) 500 MG tablet Take 500 mg by mouth 2 (two) times daily as needed. 11/01/19   [provider]  OLANZapine (ZYPREXA) 10 MG tablet Take 10 mg by mouth at bedtime. 08/29/19   [provider]  topiramate (TOPAMAX) 100 MG tablet Take 100 mg by mouth at bedtime.     [provider]  trimethoprim-polymyxin b (POLYTRIM) ophthalmic solution Place 1 drop into both eyes every 4 (four) hours. 03/31/20   Eustace Moore, MD  VYVANSE 70 MG capsule Take 70  mg by mouth every morning. 02/03/20   [provider]  omeprazole (PRILOSEC) 20 MG capsule Take 20 mg by mouth every morning. 07/18/19 03/31/20  [provider]     Allergies    Codeine   Review of Systems   Review of Systems Please see HPI for pertinent positives and negatives  Physical Exam BP 122/72   Pulse 86   Temp 98.2 F (36.8 C)   Resp 20   Ht 5\' 2"  (1.575 m)   Wt 123.8 kg   LMP 08/18/2012   SpO2 100%   BMI 49.93 kg/m   Physical Exam Vitals and nursing note reviewed.  Constitutional:      Appearance: Normal appearance.  HENT:     Head: Normocephalic and atraumatic.     Nose: Nose normal.     Mouth/Throat:     Mouth: Mucous membranes are moist.  Eyes:     General:        Right eye: Discharge present.     Extraocular Movements: Extraocular movements intact.     Pupils: Pupils are equal, round, and reactive to light.     Comments: R eye conjunctivitis, anterior chamber is clear and quiet  Cardiovascular:     Rate and Rhythm: Normal rate.  Pulmonary:     Effort: Pulmonary effort is normal.     Breath sounds: Normal breath sounds.  Abdominal:     General: Abdomen is flat.  Palpations: Abdomen is soft.     Tenderness: There is no abdominal tenderness.  Musculoskeletal:        General: No swelling. Normal range of motion.     Cervical back: Neck supple.  Skin:    General: Skin is warm and dry.  Neurological:     General: No focal deficit present.     Mental Status: She is alert.  Psychiatric:        Mood and Affect: Mood normal.    ED Results / Procedures / Treatments   EKG None  Procedures Procedures  Medications Ordered in the ED Medications  ciprofloxacin (CILOXAN) 0.3 % ophthalmic solution 1 drop (has no administration in time range)    Initial Impression and Plan  Patient with conjunctivitis, not improving with erythromycin. Given contact use will need to be on Cipro. Advised to use every 2 hours while awake, toss  contacts. Follow up with Ophtho.   As for her other symptoms, suspect this is a mild viral illness. She has normal vitals and exam now. Offered further ED workup but she would prefer to manage symptoms at home and follow up with PCP. RTED for any other concerns or worsening symptoms.   ED Course       MDM Rules/Calculators/A&P Medical Decision Making Problems Addressed: Acute bacterial conjunctivitis of right eye: acute illness or injury Viral syndrome: acute illness or injury  Risk Prescription drug management.    Final Clinical Impression(s) / ED Diagnoses Final diagnoses:  Acute bacterial conjunctivitis of right eye  Viral syndrome    Rx / DC Orders ED Discharge Orders     None        Pollyann Savoy, MD 04/04/22 [phone]

## 2022-04-04 NOTE — ED Notes (Signed)
Pt verbalizes understanding of discharge instructions. Opportunity for questioning and answers were provided. Pt discharged from ED to home with family.    

## 2022-04-06 ENCOUNTER — Other Ambulatory Visit: Payer: Self-pay | Admitting: Physician Assistant

## 2022-04-06 DIAGNOSIS — R1310 Dysphagia, unspecified: Secondary | ICD-10-CM

## 2022-04-11 ENCOUNTER — Encounter: Payer: Self-pay | Admitting: Physician Assistant

## 2022-04-11 ENCOUNTER — Ambulatory Visit: Payer: BC Managed Care – PPO | Admitting: Physician Assistant

## 2022-04-11 DIAGNOSIS — Z029 Encounter for administrative examinations, unspecified: Secondary | ICD-10-CM

## 2022-04-25 ENCOUNTER — Other Ambulatory Visit: Payer: BC Managed Care – PPO

## 2022-05-02 ENCOUNTER — Other Ambulatory Visit: Payer: Self-pay | Admitting: Physician Assistant

## 2022-05-02 DIAGNOSIS — R1310 Dysphagia, unspecified: Secondary | ICD-10-CM

## 2022-07-08 IMAGING — MR MR HEAD WO/W CM
14 series · 48 of 48 positions shown · IV contrast (multihance)
Comparison: Brain MRI 01/18/2016.

CLINICAL DATA: 55-year-old female with memory impairment.
Generalized pain.

EXAM:
MRI HEAD WITHOUT AND WITH CONTRAST
TECHNIQUE: Multiplanar, multiecho pulse sequences of the brain and surrounding
structures were obtained without and with intravenous contrast.
CONTRAST:  20mL MULTIHANCE GADOBENATE DIMEGLUMINE 529 MG/ML IV SOLN

[Series 5: T1 · sagittal · 4.0mm · 0.75mm/px · 1 of 31 slices shown (1 of 3)]
[im 1/31]
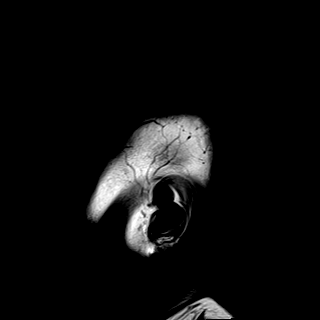

[Series 6: DWI · axial · 3.0mm · 0.94mm/px · z∈[-85,+53]mm · 7 of 160 slices shown (1 of 3)]
[im 1/160]
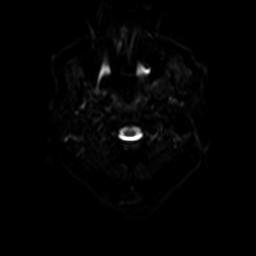
[im 27/160]
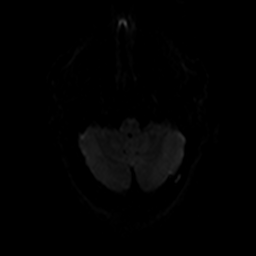
[im 54/160]
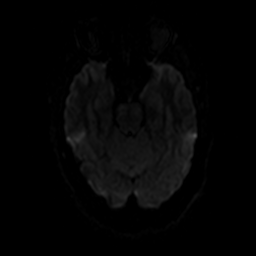
[im 80/160]
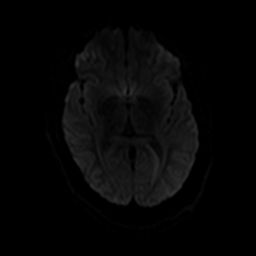
[im 107/160]
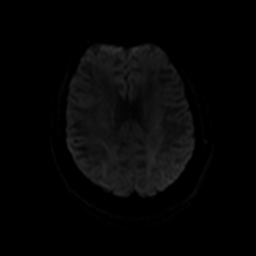
[im 133/160]
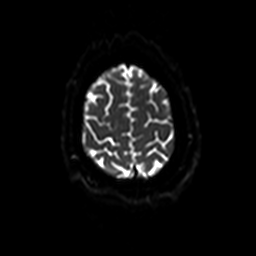
[im 160/160]
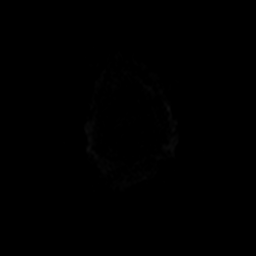

[Series 7: ax dwi_tracew · axial · 3.0mm · 0.94mm/px · z∈[-85,+53]mm · 4 of 80 slices shown]
[im 1/80]
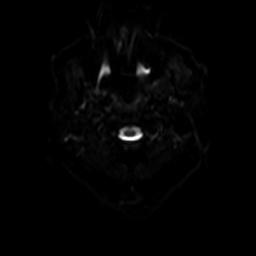
[im 27/80]
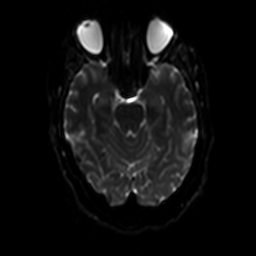
[im 53/80]
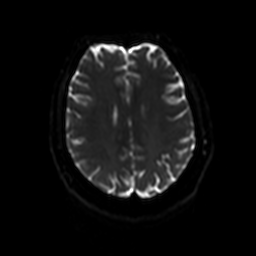
[im 80/80]
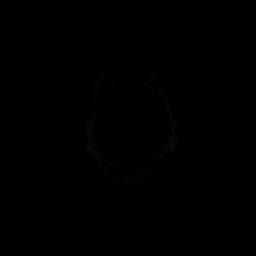

[Series 8: ax dwi_adc · axial · 3.0mm · 0.94mm/px · z∈[-85,+53]mm · 2 of 40 slices shown]
[im 1/40]
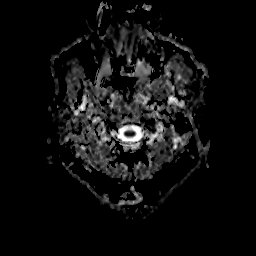
[im 40/40]
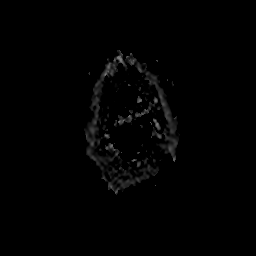

[Series 9: DWI · coronal · 5.0mm · 1.44mm/px · 3 of 64 slices shown (2 of 3)]
[im 1/64]
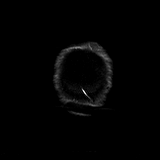
[im 32/64]
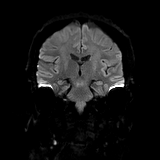
[im 64/64]
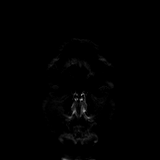

[Series 10: DWI · coronal · 5.0mm · 1.44mm/px · 2 of 32 slices shown (3 of 3)]
[im 1/32]
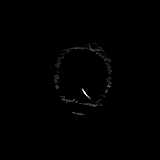
[im 32/32]
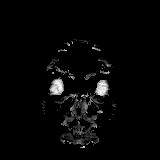

[Series 11: T2 · axial · 4.0mm · 0.36mm/px · 1 of 28 slices shown]
[im 1/28]
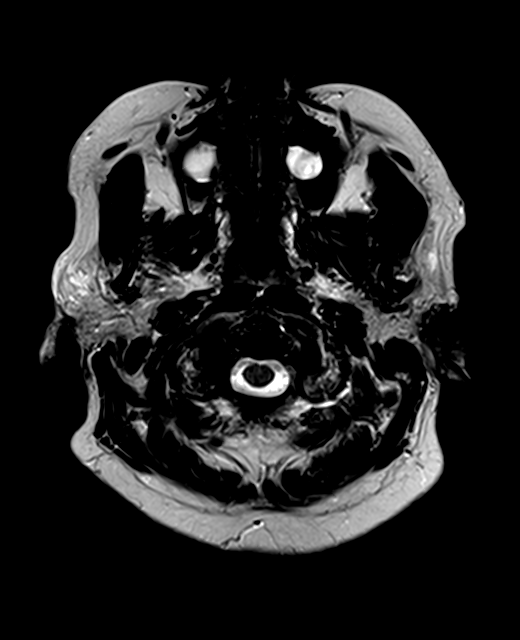

[Series 12: FLAIR · axial · 3.0mm · 0.72mm/px · 1 of 26 slices shown]
[im 1/26]
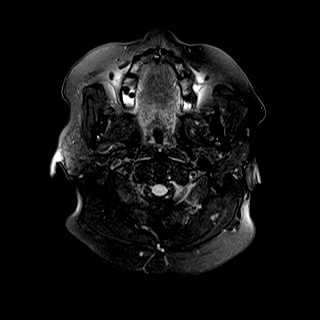

[Series 14: swi_images · axial · 1.5mm · 0.90mm/px · z∈[-81,+58]mm · 5 of 96 slices shown]
[im 1/96]
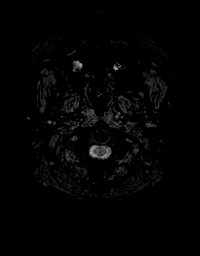
[im 24/96]
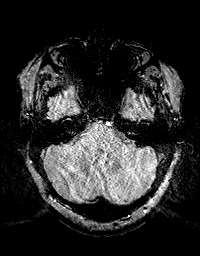
[im 48/96]
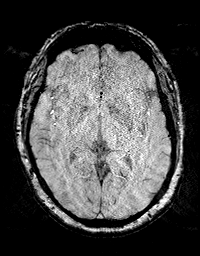
[im 72/96]
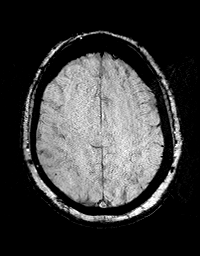
[im 96/96]
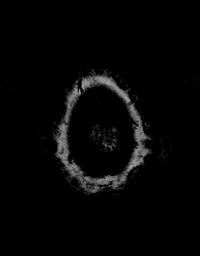

[Series 15: T1 · axial · 1.0mm · 0.94mm/px · z∈[-95,+62]mm · 8 of 160 slices shown (2 of 3)]
[im 1/160]
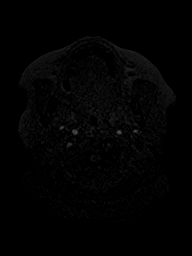
[im 23/160]
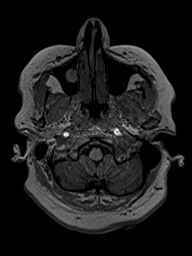
[im 46/160]
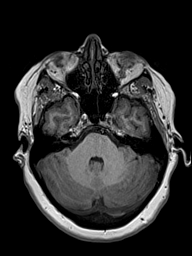
[im 69/160]
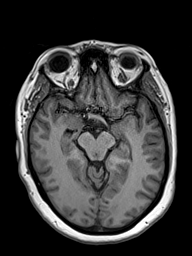
[im 91/160]
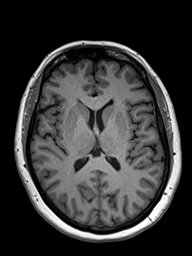
[im 114/160]
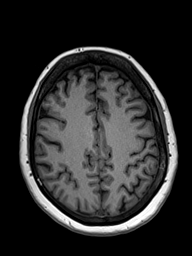
[im 137/160]
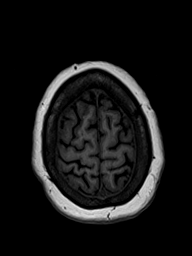
[im 160/160]
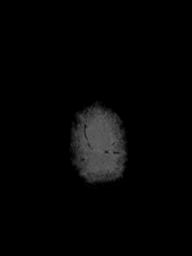

[Series 16: T2 post-contrast · coronal · 4.0mm · 0.36mm/px · 2 of 34 slices shown]
[im 1/34]
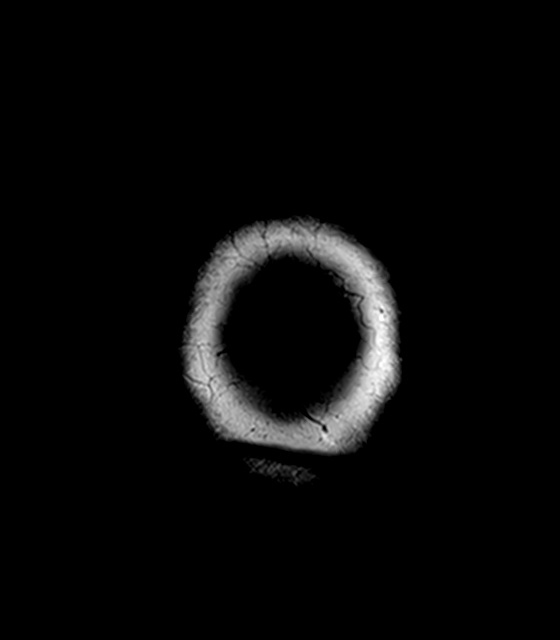
[im 34/34]
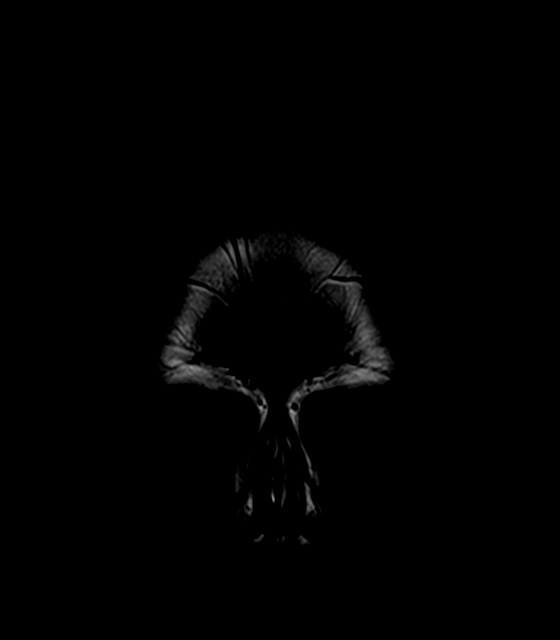

[Series 17: T1 · axial · 1.0mm · 0.94mm/px · z∈[-95,+62]mm · 8 of 160 slices shown (3 of 3)]
[im 1/160]
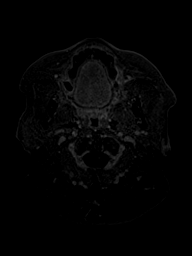
[im 23/160]
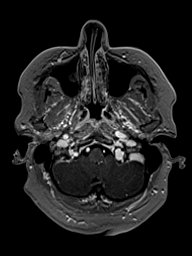
[im 46/160]
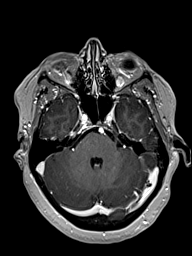
[im 69/160]
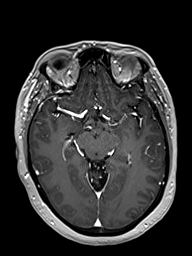
[im 91/160]
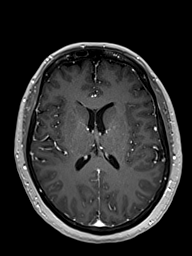
[im 114/160]
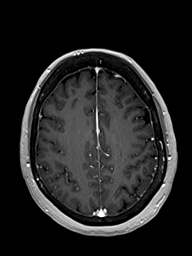
[im 137/160]
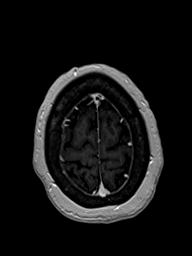
[im 160/160]
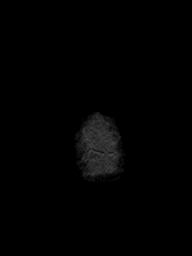

[Series 18: T1 post-contrast · coronal · 4.0mm · 0.72mm/px · 2 of 34 slices shown (1 of 2)]
[im 1/34]
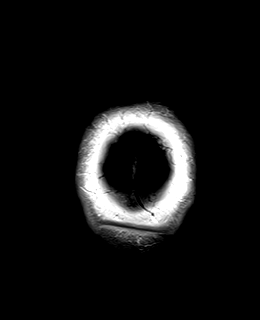
[im 34/34]
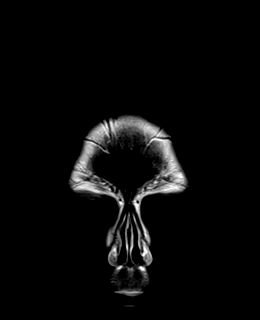

[Series 19: T1 post-contrast · sagittal · 4.0mm · 0.75mm/px · 2 of 31 slices shown (2 of 2)]
[im 1/31]
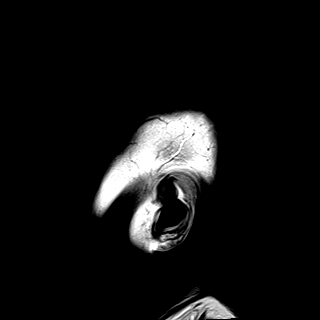
[im 31/31]
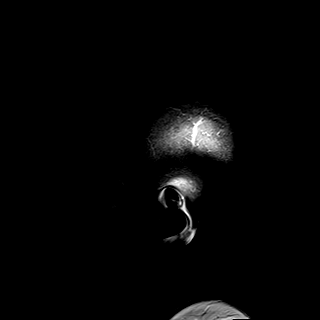

[48 of 48 positions shown; findings below may reference images not displayed]

FINDINGS: Brain: Cerebral volume is stable and within limits. No restricted
diffusion to suggest acute infarction. No midline shift, mass
effect, evidence of mass lesion, ventriculomegaly, extra-axial
collection or acute intracranial hemorrhage. Cervicomedullary
junction and pituitary are within normal limits.

Mesial temporal lobe structures appear symmetric and normal. Largely
stable gray and white matter signal throughout the brain since 0729,
normal for age with only minimal nonspecific white matter T2 and
FLAIR hyperintensity (series 12, image 17). No cortical
encephalomalacia or chronic cerebral blood products.

No abnormal enhancement identified. No dural thickening.

Vascular: Major intracranial vascular flow voids are stable since
0729. Dominant right vertebral artery (normal variant). Major dural
venous sinuses are enhancing and appear to be patent.

Skull and upper cervical spine: Visualized bone marrow signal is
within normal limits. Negative visible cervical spine.

Sinuses/Orbits: Negative orbits. Paranasal sinuses remain well
aerated, small bilateral maxillary sinus mucous retention cysts are
unchanged since 0729.

Other: Mastoids are clear. Visible internal auditory structures
appear normal. Negative visible scalp and face.
IMPRESSION: No acute intracranial abnormality. Normal for age MRI appearance of
the brain, stable since [DATE].

## 2022-08-29 ENCOUNTER — Emergency Department (HOSPITAL_BASED_OUTPATIENT_CLINIC_OR_DEPARTMENT_OTHER): Payer: BC Managed Care – PPO | Admitting: Radiology

## 2022-08-29 ENCOUNTER — Encounter (HOSPITAL_BASED_OUTPATIENT_CLINIC_OR_DEPARTMENT_OTHER): Payer: Self-pay

## 2022-08-29 DIAGNOSIS — Z91010 Allergy to peanuts: Secondary | ICD-10-CM | POA: Diagnosis not present

## 2022-08-29 DIAGNOSIS — W1830XA Fall on same level, unspecified, initial encounter: Secondary | ICD-10-CM | POA: Insufficient documentation

## 2022-08-29 DIAGNOSIS — Y9301 Activity, walking, marching and hiking: Secondary | ICD-10-CM | POA: Insufficient documentation

## 2022-08-29 DIAGNOSIS — S8391XA Sprain of unspecified site of right knee, initial encounter: Secondary | ICD-10-CM | POA: Insufficient documentation

## 2022-08-29 DIAGNOSIS — M25561 Pain in right knee: Secondary | ICD-10-CM | POA: Diagnosis present

## 2022-08-29 NOTE — ED Triage Notes (Signed)
Pt presents to the ED with right knee pain. States that the pain started about two months ago and today when she was walking to the car she states that her knee gave out and states that she fell to the ground. Denies hitting her head or LOC. Pt reports continued knee pain.

## 2022-08-30 ENCOUNTER — Emergency Department (HOSPITAL_BASED_OUTPATIENT_CLINIC_OR_DEPARTMENT_OTHER)
Admission: EM | Admit: 2022-08-30 | Discharge: 2022-08-30 | Disposition: A | Discharge status: Home / Self Care | Payer: BC Managed Care – PPO | Attending: Emergency Medicine | Admitting: Emergency Medicine

## 2022-08-30 DIAGNOSIS — S8391XA Sprain of unspecified site of right knee, initial encounter: Secondary | ICD-10-CM

## 2022-08-30 NOTE — ED Provider Notes (Signed)
[facility] EMERGENCY DEPT Provider Note   CSN: [id number] Arrival date & time: 08/29/22  1916     History  Chief Complaint  Patient presents with   Knee Pain    Alexandra Stein is a 56 y.o. female.  The history is provided by the patient.  Knee Pain Location:  Knee Knee location:  R knee Pain details:    Quality:  Aching   Onset quality:  Gradual   Timing:  Constant Chronicity:  Recurrent Worsened by:  Activity Patient reports she has had right knee pain for over 2 months.  It hurts after she has been sitting for a while and she stands up.  She reports earlier in the day her knee "gave out "and she fell down directly on the knee.  No other injuries are reported.  She now reports it hurts to ambulate No previous history of DVT. No other complaints    Home Medications Prior to Admission medications   Medication Sig Start Date End Date Taking? Authorizing Provider  acetaminophen (TYLENOL) 500 MG tablet Take 1,000 mg by mouth every 6 (six) hours as needed for moderate pain.    [provider]  ARIPiprazole (ABILIFY) 15 MG tablet Take 15 mg by mouth at bedtime. 08/29/19   [provider]  DULoxetine (CYMBALTA) 60 MG capsule Take 60 mg by mouth at bedtime.     [provider]  furosemide (LASIX) 40 MG tablet Take 40 mg by mouth every morning. 08/29/19   [provider]  methocarbamol (ROBAXIN) 750 MG tablet Take 1 tablet (750 mg total) by mouth 3 (three) times daily as needed (muscle spasm/pain). 06/05/20   Lajean Saver, MD  naproxen (NAPROSYN) 500 MG tablet Take 500 mg by mouth 2 (two) times daily as needed. 11/01/19   [provider]  OLANZapine (ZYPREXA) 10 MG tablet Take 10 mg by mouth at bedtime. 08/29/19   [provider]  topiramate (TOPAMAX) 100 MG tablet Take 100 mg by mouth at bedtime.     [provider]  trimethoprim-polymyxin b (POLYTRIM) ophthalmic solution Place 1 drop into both eyes  every 4 (four) hours. 03/31/20   Raylene Everts, MD  VYVANSE 70 MG capsule Take 70 mg by mouth every morning. 02/03/20   [provider]  omeprazole (PRILOSEC) 20 MG capsule Take 20 mg by mouth every morning. 07/18/19 03/31/20  [provider]      Allergies    Codeine and Peanut allergen powder-dnfp    Review of Systems   Review of Systems  Physical Exam Updated Vital Signs BP 139/75   Pulse 64   Temp 97.9 F (36.6 C) (Oral)   Resp 16   Ht 1.575 m (5\' 2" )   Wt 119.3 kg   LMP 08/18/2012   SpO2 99%   BMI 48.10 kg/m  Physical Exam CONSTITUTIONAL: Well developed/well nourished HEAD: Normocephalic/atraumatic EYES: EOMI/PERRL ENMT: Mucous membranes moist ABDOMEN: soft, obese NEURO: Pt is awake/alert/appropriate, moves all extremitiesx4.  No facial droop.   EXTREMITIES: pulses normal/equal, full ROM Exam limited due to to morbid obesity Tenderness to palpation and range of motion of the right knee.  No deformities.  Distal pulses equal and intact.  The legs are symmetric.  No calf tenderness. SKIN: warm, color normal PSYCH: no abnormalities of mood noted, alert and oriented to situation  ED Results / Procedures / Treatments   Labs (all labs ordered are listed, but only abnormal results are displayed) Labs Reviewed - No data to  display  EKG None  Radiology DG Knee Complete 4 Views Right  Result Date: 08/29/2022 CLINICAL DATA:  Knee pain with fall. EXAM: RIGHT KNEE - COMPLETE 4+ VIEW COMPARISON:  06/05/2020. FINDINGS: No evidence of acute fracture or dislocation. There are mild-to-moderate tricompartmental degenerative changes, most pronounced in the medial compartment. A trace joint effusion is noted. Soft tissues are unremarkable. IMPRESSION: 1. No acute fracture or dislocation. 2. 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 Electronically Signed   By: Thornell Sartorius M.D.   On: 08/29/2022 20:33     Procedures Procedures    Medications Ordered in ED Medications - No data to display  ED Course/ Medical Decision Making/ A&P                           Medical Decision Making Amount and/or Complexity of Data Reviewed Radiology: ordered.   Patient with significant degenerative changes at baseline, likely has acute sprain.  No signs of fracture or dislocation.  Will provide knee sleeve and crutches, and referred to orthopedics.  Patient reports OTC meds have been controlling her pain        Final Clinical Impression(s) / ED Diagnoses Final diagnoses:  Sprain of right knee, unspecified ligament, initial encounter    Rx / DC Orders ED Discharge Orders     None         Zadie Rhine, MD 08/30/22 [phone]

## 2022-09-01 ENCOUNTER — Ambulatory Visit (INDEPENDENT_AMBULATORY_CARE_PROVIDER_SITE_OTHER): Payer: BC Managed Care – PPO | Admitting: Sports Medicine

## 2022-09-01 ENCOUNTER — Encounter: Payer: Self-pay | Admitting: Sports Medicine

## 2022-09-01 ENCOUNTER — Ambulatory Visit (INDEPENDENT_AMBULATORY_CARE_PROVIDER_SITE_OTHER): Payer: BC Managed Care – PPO

## 2022-09-01 DIAGNOSIS — M25561 Pain in right knee: Secondary | ICD-10-CM | POA: Diagnosis not present

## 2022-09-01 DIAGNOSIS — M170 Bilateral primary osteoarthritis of knee: Secondary | ICD-10-CM

## 2022-09-01 DIAGNOSIS — M25461 Effusion, right knee: Secondary | ICD-10-CM

## 2022-09-01 MED ORDER — LIDOCAINE HCL 1 % IJ SOLN
2.0000 mL | INTRAMUSCULAR | Status: AC | PRN
  Administered 2022-09-01: 2 mL

## 2022-09-01 MED ORDER — METHYLPREDNISOLONE ACETATE 40 MG/ML IJ SUSP
80.0000 mg | INTRAMUSCULAR | Status: AC | PRN
  Administered 2022-09-01: 80 mg via INTRA_ARTICULAR

## 2022-09-01 MED ORDER — BUPIVACAINE HCL 0.25 % IJ SOLN
2.0000 mL | INTRAMUSCULAR | Status: AC | PRN
  Administered 2022-09-01: 2 mL via INTRA_ARTICULAR

## 2022-09-01 NOTE — Progress Notes (Signed)
Right knee pain; 1 week  Knee gave out and fell directly on it Went to ED where xrays were taken  Is wearing a hinged knee brace today

## 2022-09-01 NOTE — Patient Instructions (Signed)
[place], it was great to meet you today, thank you for letting me participate in your care.  I am sorry that your knee is hurting so much, I am hopeful that this improves your pain and function.  -We did do a corticosteroid injection into the knee today.  You are to rest and ice the knee 3 times a day for the next 2-3 days.  Starting on Monday, you may get back into regular activity as your pain allows.  You may continue to take the naproxen medication as needed.  -A custom brace may be beneficial for you.  If you are not getting the relief you hope from the injection, please let me know and I can contact our knee brace representative to design a custom brace for you.  You will follow-up with me in about 3-4 weeks.  We can always consider a MRI of the knee to evaluate for meniscus and other structures if this is not improving as we hope.   If you have any further questions, please give the clinic a call [phone].  Elba Barman, DO Primary Care Sports Medicine Physician  [place]

## 2022-09-01 NOTE — Progress Notes (Signed)
Alexandra Stein - 56 y.o. female MRN [id number]  Date of birth: Jul 04, 1966  Office Visit Note: Visit Date: 09/01/2022 PCP: Elias Else, MD Referred by: Elias Else, MD  Subjective: Chief Complaint  Patient presents with   Right Knee - Pain   HPI: Alexandra Stein is a pleasant 56 y.o. female who presents today for right knee pain.  Alexandra Stein reports right knee pain for greater than 2 months.  She had an episode where she stepped awkwardly and tweaked the knee.  She had some pain but it was manageable.  Has had pain intermittently when she sits for a while goes to stand up.  However about 1 week ago she had an episode where the knee completely gave out on her and she fell directly onto the knee.  She was seen in the ED on 08/30/2022 and told she has arthritis of the knees without fracture.  They did provide her with a knee sleeve and crutches to be used as needed.  She has been taking naproxen during the day which does help to some extent.  Pain is over the medial aspect of the anterior knee.  Pertinent ROS were reviewed with the patient and found to be negative unless otherwise specified above in HPI.   Assessment & Plan: Visit Diagnoses:  1. Acute pain of right knee   2. Bilateral primary osteoarthritis of knee   3. Swelling of joint of right knee    Plan: Discussed with Alexandra Stein that she does have arthritis of both knees, although the right knee is near bone-on-bone on the medial joint line.  She however has had exacerbation of her right knee pain with some giving out of the knee and with her exquisite medial joint line TTP, diagnosis favors flare of osteoarthritis versus medial meniscal tear.  Given the degree of her pain, did discuss all treatment options but decided to proceed with corticosteroid injection into the knee which she tolerated well.  May use ice and/or naproxen for any postinjection pain.  Recommended rice therapy over the next few days.  Given that the knee brace she has  does not fit her, I did let her know we can order a custom knee brace to be fitted for her by our [employer] representative.  She is to call if she wishes to proceed with this.  We will have her follow-up in the next 3-4 weeks for reevaluation.  Hopefully she gets good relief, however if she does not we would consider MRI of the knee.  Follow-up: Return in about 4 weeks (around 09/29/2022) for 3-4 weeks with Dr. Shon Baton for the right knee.   Meds & Orders: No orders of the defined types were placed in this encounter.   Orders Placed This Encounter  Procedures   Large Joint Inj: R knee   XR Knee 1-2 Views Right     Procedures: Large Joint Inj: R knee on 09/01/2022 4:32 PM Details: 22 G 1.5 in needle, anterolateral approach Medications: 2 mL lidocaine 1 %; 2 mL bupivacaine 0.25 %; 80 mg methylPREDNISolone acetate 40 MG/ML Outcome: tolerated well, no immediate complications Procedure, treatment alternatives, risks and benefits explained, specific risks discussed. Consent was given by the patient. Patient was prepped and draped in the usual sterile fashion.          Clinical History: No specialty comments available.  She reports that she has never smoked. She has never used smokeless tobacco. No results for input(s): "HGBA1C", "LABURIC" in the last 8760 hours.  Objective:   Vital Signs: LMP 08/18/2012   Physical Exam  Gen: Well-appearing, in no acute distress; non-toxic CV: Well-perfused. Warm.  Resp: Breathing unlabored on room air; no wheezing. Psych: Fluid speech in conversation; appropriate affect; normal thought process Neuro: Sensation intact throughout. No gross coordination deficits.   Ortho Exam - Right knee: There is a trace to small effusion on the knee without significant warmth or redness.  Positive TTP over the medial joint line.  Range of motion from 0-130 degrees, some pain with endrange flexion.  Ligamentously intact.  There is pain without reproducible clicking on  McMurray's test on the medial joint line. NVI.  Strength 5/5 throughout.  Imaging: XR Knee 1-2 Views Right  Result Date: 09/01/2022 2 views of the right knee including standing AP and Zoila Shutter views were ordered and reviewed by myself.  X-rays demonstrate moderate-severe medial joint OA with near bone-on-bone DJD.  There is some spurring of the medial tibial plateau and to a lesser degree the MFC. No acute fracture.   *Independent review of right knee x-ray from the ED on 08/29/2022 demonstrate at least moderate tricompartmental changes with most pronounced joint space loss in the medial compartment.  Likely small joint effusion.  No acute fracture noted.  EXAM: RIGHT KNEE - COMPLETE 4+ VIEW   COMPARISON:  06/05/2020.   FINDINGS: No evidence of acute fracture or dislocation. There are mild-to-moderate tricompartmental degenerative changes, most pronounced in the medial compartment. A trace joint effusion is noted. Soft tissues are unremarkable.   IMPRESSION: 1. No acute fracture or dislocation. 2. 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   Past Medical/Family/Surgical/Social History: Medications & Allergies reviewed per EMR, new medications updated. Patient Active Problem List   Diagnosis Date Noted   Memory loss 02/01/2022   Daytime somnolence 12/15/2021   Acute respiratory disease due to COVID-19 virus 09/10/2019   Obesity, Class III, BMI 40-49.9 (morbid obesity) (HCC) 09/10/2019   Chest pain at rest 05/22/2019   Attention deficit hyperactivity disorder (ADHD), predominantly inattentive type 09/18/2017   Depression 01/18/2016   Syncope 01/18/2016   Hyperlipidemia    GERD (gastroesophageal reflux disease)    Alteration of consciousness    Symptomatic cholelithiasis 07/26/2012   Bipolar disorder (HCC) 07/26/2012   Chest pain 12/12/2011   Hypotension 12/12/2011   Past Medical History:  Diagnosis Date   Bipolar 2 disorder (HCC)     GERD (gastroesophageal reflux disease)    H/O chest pain    dx'd with GERD   History of syncope 2002   Vasovagal   Hyperlipidemia    Migraines    Potassium (K) deficiency    per pt K+ deficiency - ? from meds she takes ( none of which are diuretucs)   SVD (spontaneous vaginal delivery)    x 2   Family History  Problem Relation Age of Onset   Dilated cardiomyopathy Mother        with ICD   Heart disease Mother    Atrial fibrillation Mother    Lung cancer Maternal Grandfather        black lung   Cancer Maternal Grandfather    Diabetes type II Father    Cancer Father    Kidney failure Father    Dementia Maternal Grandmother    Cancer Maternal Grandmother    Stroke Maternal Grandmother    Breast cancer Other    Healthy Brother    Healthy Child        x3   Past Surgical  History:  Procedure Laterality Date   CESAREAN SECTION     x 1   CHOLECYSTECTOMY  07/27/2012   Procedure: LAPAROSCOPIC CHOLECYSTECTOMY;  Surgeon: Harl Bowie, MD;  Location: [facility];  Service: General;  Laterality: N/A;   TUBAL LIGATION     x 2   WISDOM TOOTH EXTRACTION     Social History   Occupational History   Occupation: Product manager: [employer]  Tobacco Use   Smoking status: Never   Smokeless tobacco: Never  Substance and Sexual Activity   Alcohol use: No   Drug use: No   Sexual activity: Never    Birth control/protection: Pill

## 2022-09-22 ENCOUNTER — Ambulatory Visit: Payer: BC Managed Care – PPO | Admitting: Sports Medicine

## 2022-12-21 ENCOUNTER — Encounter (HOSPITAL_COMMUNITY): Payer: Self-pay

## 2022-12-21 ENCOUNTER — Emergency Department (HOSPITAL_COMMUNITY): Payer: Commercial Managed Care - PPO

## 2022-12-21 ENCOUNTER — Other Ambulatory Visit: Payer: Self-pay

## 2022-12-21 ENCOUNTER — Emergency Department (HOSPITAL_COMMUNITY)
Admission: EM | Admit: 2022-12-21 | Discharge: 2022-12-21 | Disposition: A | Discharge status: Home / Self Care | Payer: Commercial Managed Care - PPO | Attending: Emergency Medicine | Admitting: Emergency Medicine

## 2022-12-21 DIAGNOSIS — Z20822 Contact with and (suspected) exposure to covid-19: Secondary | ICD-10-CM | POA: Diagnosis not present

## 2022-12-21 DIAGNOSIS — Z91010 Allergy to peanuts: Secondary | ICD-10-CM | POA: Diagnosis not present

## 2022-12-21 DIAGNOSIS — R0789 Other chest pain: Secondary | ICD-10-CM | POA: Insufficient documentation

## 2022-12-21 DIAGNOSIS — R0602 Shortness of breath: Secondary | ICD-10-CM | POA: Insufficient documentation

## 2022-12-21 LAB — CBC WITH DIFFERENTIAL/PLATELET
Abs Immature Granulocytes: 0.01 10*3/uL (ref 0.00–0.07)
Basophils Absolute: 0 10*3/uL (ref 0.0–0.1)
Basophils Relative: 0 %
Eosinophils Absolute: 0.1 10*3/uL (ref 0.0–0.5)
Eosinophils Relative: 1 %
HCT: 39.3 % (ref 36.0–46.0)
Hemoglobin: 12.8 g/dL (ref 12.0–15.0)
Immature Granulocytes: 0 %
Lymphocytes Relative: 16 %
Lymphs Abs: 1.2 10*3/uL (ref 0.7–4.0)
MCH: 29.2 pg (ref 26.0–34.0)
MCHC: 32.6 g/dL (ref 30.0–36.0)
MCV: 89.7 fL (ref 80.0–100.0)
Monocytes Absolute: 0.5 10*3/uL (ref 0.1–1.0)
Monocytes Relative: 7 %
Neutro Abs: 5.4 10*3/uL (ref 1.7–7.7)
Neutrophils Relative %: 76 %
Platelets: 215 10*3/uL (ref 150–400)
RBC: 4.38 MIL/uL (ref 3.87–5.11)
RDW: 13.8 % (ref 11.5–15.5)
WBC: 7.2 10*3/uL (ref 4.0–10.5)
nRBC: 0 % (ref 0.0–0.2)

## 2022-12-21 LAB — BASIC METABOLIC PANEL
Anion gap: 9 (ref 5–15)
BUN: 16 mg/dL (ref 6–20)
CO2: 21 mmol/L — ABNORMAL LOW (ref 22–32)
Calcium: 8.6 mg/dL — ABNORMAL LOW (ref 8.9–10.3)
Chloride: 106 mmol/L (ref 98–111)
Creatinine, Ser: 0.88 mg/dL (ref 0.44–1.00)
GFR, Estimated: >60 mL/min (ref >60)
Glucose, Bld: 113 mg/dL — ABNORMAL HIGH (ref 70–99)
Potassium: 3.5 mmol/L (ref 3.5–5.1)
Sodium: 136 mmol/L (ref 135–145)

## 2022-12-21 LAB — TROPONIN I (HIGH SENSITIVITY)
Troponin I (High Sensitivity): 4 ng/L (ref <18)
Troponin I (High Sensitivity): 3 ng/L (ref <18)

## 2022-12-21 LAB — RESP PANEL BY RT-PCR (RSV, FLU A&B, COVID)  RVPGX2
Influenza A by PCR: NEGATIVE
Influenza B by PCR: NEGATIVE
Resp Syncytial Virus by PCR: NEGATIVE
SARS Coronavirus 2 by RT PCR: NEGATIVE

## 2022-12-21 NOTE — ED Notes (Signed)
Patient transported to X-ray 

## 2022-12-21 NOTE — ED Provider Notes (Signed)
Alexandra Stein  Provider Note  CSN: [id number] Arrival date & time: 12/21/22 [id number]  History Chief Complaint  Patient presents with   Chest Pain    Alexandra Stein is a 57 y.o. female presents via EMS for evaluation of chest tightness and SOB. She reports around 2030hrs tonight she was at home resting when she began to feel chills but no reported fever. She went to bed to get under the blanket and about 19mn later began to feel SOB with midsternal chest tightness. She also reports some sharp pain in R elbow but thinks this is two separate pains. She called EMS who report normal initial vitals and EKG. BP did drop mildly enroute and improved with IVF. She has not had a cough, N/V/D. She reports SOB has resolved, but chest tightness remains. ASA given enroute. She denies any history of CAD, HTN, DM. Documented history of HLD but no longer on meds for same. Denies tobacco use, no PE risk factors (no travel, leg swelling, surgery, cancer, estrogen). She had a remote history of a work up for chest pain which was negative. No prior stents or MI.    Home Medications Prior to Admission medications   Medication Sig Start Date End Date Taking? Authorizing Provider  acetaminophen (TYLENOL) 500 MG tablet Take 1,000 mg by mouth every 6 (six) hours as needed for moderate pain.    [provider]  ARIPiprazole (ABILIFY) 15 MG tablet Take 15 mg by mouth at bedtime. 08/29/19   [provider]  DULoxetine (CYMBALTA) 60 MG capsule Take 60 mg by mouth at bedtime.     [provider]  furosemide (LASIX) 40 MG tablet Take 40 mg by mouth every morning. 08/29/19   [provider]  methocarbamol (ROBAXIN) 750 MG tablet Take 1 tablet (750 mg total) by mouth 3 (three) times daily as needed (muscle spasm/pain). 06/05/20   SLajean Saver MD  naproxen (NAPROSYN) 500 MG tablet Take 500 mg by mouth 2 (two) times daily as needed. 11/01/19    [provider]  OLANZapine (ZYPREXA) 10 MG tablet Take 10 mg by mouth at bedtime. 08/29/19   [provider]  topiramate (TOPAMAX) 100 MG tablet Take 100 mg by mouth at bedtime.     [provider]  trimethoprim-polymyxin b (POLYTRIM) ophthalmic solution Place 1 drop into both eyes every 4 (four) hours. 03/31/20   NRaylene Everts MD  VYVANSE 70 MG capsule Take 70 mg by mouth every morning. 02/03/20   [provider]  omeprazole (PRILOSEC) 20 MG capsule Take 20 mg by mouth every morning. 07/18/19 03/31/20  [provider]     Allergies    Codeine and Peanut allergen powder-dnfp   Review of Systems   Review of Systems Please see HPI for pertinent positives and negatives  Physical Exam BP 133/86   Pulse 79   Temp 97.9 F (36.6 C)   Resp 14   LMP 08/18/2012   SpO2 96%   Physical Exam Vitals and nursing note reviewed.  Constitutional:      Appearance: Normal appearance.  HENT:     Head: Normocephalic and atraumatic.     Nose: Nose normal.     Mouth/Throat:     Mouth: Mucous membranes are moist.  Eyes:     Extraocular Movements: Extraocular movements intact.     Conjunctiva/sclera: Conjunctivae normal.  Cardiovascular:     Rate and Rhythm: Normal rate and regular rhythm.  Pulmonary:  Effort: Pulmonary effort is normal.     Breath sounds: Normal breath sounds.  Chest:     Chest wall: No tenderness.  Abdominal:     General: Abdomen is flat.     Palpations: Abdomen is soft.     Tenderness: There is no abdominal tenderness.  Musculoskeletal:        General: No swelling. Normal range of motion.     Cervical back: Neck supple.  Skin:    General: Skin is warm and dry.  Neurological:     General: No focal deficit present.     Mental Status: She is alert.  Psychiatric:        Mood and Affect: Mood normal.     ED Results / Procedures / Treatments   EKG EKG Interpretation  Date/Time:  Wednesday December 21 2022 02:00:40  EST Ventricular Rate:  84 PR Interval:  152 QRS Duration: 107 QT Interval:  363 QTC Calculation: 430 R Axis:   42 Text Interpretation: Sinus rhythm Borderline abnrm T, anterolateral leads No significant change since last tracing Confirmed by Calvert Cantor [phone]) on 12/21/2022 2:03:51 AM  Procedures Procedures  Medications Ordered in the ED Medications - No data to display  Initial Impression and Plan  Patient with low risk factor profile for CAD here for atypical pain, no exertional component, no concern for PE, PERC neg. She reports she feels cold so could be viral syndrome although afebrile with EMS and on arrival here. Will check labs including Trop and Covid/Flu/RSV swab, CXR. EKG is non ischemic and unchanged from previous. Currently resting comfortably in no distress, NSR on monitor.    ED Course   Clinical Course as of 12/21/22 0439  Wed Dec 21, 2022  0242 CBC is normal.  [CS]  [phone] I personally viewed the images from radiology studies and agree with radiologist interpretation: CXR is clear [CS]  0302 BMP is unremarkable. Trop #1 is normal.  [CS]  [id number] Covid/Flu/RSV swab is neg.  [CS]  [id number] Repeat Trop remains normal. HEART score is 2, low risk for MACE. Plan discharge home with PCP follow up. RTED for any other concerns. [CS]    Clinical Course User Index [CS] Truddie Hidden, MD     MDM Rules/Calculators/A&P Medical Decision Making Given presenting complaint, I considered that admission might be necessary. After review of results from ED lab and/or imaging studies, admission to the hospital is not indicated at this time.    Problems Addressed: Atypical chest pain: acute illness or injury  Amount and/or Complexity of Data Reviewed Labs: ordered. Decision-making details documented in ED Course. Radiology: ordered and independent interpretation performed. Decision-making details documented in ED Course. ECG/medicine tests: ordered and independent interpretation  performed. Decision-making details documented in ED Course.  Risk Decision regarding hospitalization.     Final Clinical Impression(s) / ED Diagnoses Final diagnoses:  Atypical chest pain    Rx / DC Orders ED Discharge Orders     None        Truddie Hidden, MD 12/21/22 [phone]

## 2022-12-21 NOTE — ED Triage Notes (Signed)
Pt via [facility] c/o chest pain and SOB that started tonight. Aspirin given.      IV L AC

## 2022-12-28 ENCOUNTER — Other Ambulatory Visit: Payer: Self-pay

## 2022-12-28 ENCOUNTER — Emergency Department (HOSPITAL_COMMUNITY)
Admission: EM | Admit: 2022-12-28 | Discharge: 2022-12-29 | Disposition: A | Discharge status: Home / Self Care | Payer: Commercial Managed Care - PPO | Attending: Emergency Medicine | Admitting: Emergency Medicine

## 2022-12-28 ENCOUNTER — Encounter (HOSPITAL_COMMUNITY): Payer: Self-pay | Admitting: *Deleted

## 2022-12-28 ENCOUNTER — Emergency Department (HOSPITAL_COMMUNITY): Payer: Commercial Managed Care - PPO

## 2022-12-28 DIAGNOSIS — R791 Abnormal coagulation profile: Secondary | ICD-10-CM | POA: Insufficient documentation

## 2022-12-28 DIAGNOSIS — R110 Nausea: Secondary | ICD-10-CM | POA: Insufficient documentation

## 2022-12-28 DIAGNOSIS — Z91010 Allergy to peanuts: Secondary | ICD-10-CM | POA: Diagnosis not present

## 2022-12-28 DIAGNOSIS — R0602 Shortness of breath: Secondary | ICD-10-CM | POA: Diagnosis not present

## 2022-12-28 DIAGNOSIS — R079 Chest pain, unspecified: Secondary | ICD-10-CM | POA: Insufficient documentation

## 2022-12-28 MED ORDER — IOHEXOL 350 MG/ML SOLN
75.0000 mL | Freq: Once | INTRAVENOUS | Status: AC | PRN
  Administered 2022-12-28: 75 mL via INTRAVENOUS

## 2022-12-28 NOTE — ED Notes (Signed)
ED Provider at bedside. 

## 2022-12-28 NOTE — ED Notes (Signed)
Patient transported to CT 

## 2022-12-28 NOTE — ED Triage Notes (Signed)
Pt with SOB and elevated d-dimer per her PCP.  Instructed to come here for CT. Denies any CP + nausea and dizziness.

## 2022-12-28 NOTE — Discharge Instructions (Signed)
1. No CT evidence of pulmonary artery embolus. 2. Cluster of ground-glass nodular density in the right middle lobe consistent with an infectious/inflammatory etiology. Clinical correlation is recommended.

## 2022-12-29 NOTE — ED Provider Notes (Signed)
[facility] Provider Note   CSN: [id number] Arrival date & time: 12/28/22  2034     History  No chief complaint on file.   Alexandra Stein is a 57 y.o. female.  It was seen here for a week ago for chest pain or shortness of breath.  Had ACS ruled out.  Her chest pain since that time is resolved she continues with shortness of breath, nausea and just not feeling well.  She followed up with her outpatient provider who ordered a D-dimer which was elevated at 0.62 told her she needed to come here for PE scan immediately.  No other changes in her symptoms.  No fevers, cough, respiratory symptoms, urinary changes or GI changes otherwise.  No trauma.  History of indigestion but this does not feel like that.        Home Medications Prior to Admission medications   Medication Sig Start Date End Date Taking? Authorizing Provider  acetaminophen (TYLENOL) 500 MG tablet Take 1,000 mg by mouth every 6 (six) hours as needed for moderate pain.    [provider]  ARIPiprazole (ABILIFY) 15 MG tablet Take 15 mg by mouth at bedtime. 08/29/19   [provider]  DULoxetine (CYMBALTA) 60 MG capsule Take 60 mg by mouth at bedtime.     [provider]  furosemide (LASIX) 40 MG tablet Take 40 mg by mouth every morning. 08/29/19   [provider]  methocarbamol (ROBAXIN) 750 MG tablet Take 1 tablet (750 mg total) by mouth 3 (three) times daily as needed (muscle spasm/pain). 06/05/20   Lajean Saver, MD  naproxen (NAPROSYN) 500 MG tablet Take 500 mg by mouth 2 (two) times daily as needed. 11/01/19   [provider]  OLANZapine (ZYPREXA) 10 MG tablet Take 10 mg by mouth at bedtime. 08/29/19   [provider]  topiramate (TOPAMAX) 100 MG tablet Take 100 mg by mouth at bedtime.     [provider]  trimethoprim-polymyxin b (POLYTRIM) ophthalmic solution Place 1 drop into both eyes every 4 (four) hours.  03/31/20   Raylene Everts, MD  VYVANSE 70 MG capsule Take 70 mg by mouth every morning. 02/03/20   [provider]  omeprazole (PRILOSEC) 20 MG capsule Take 20 mg by mouth every morning. 07/18/19 03/31/20  [provider]      Allergies    Codeine and Peanut allergen powder-dnfp    Review of Systems   Review of Systems  Physical Exam Updated Vital Signs BP 133/70   Pulse 66   Temp 97.8 F (36.6 C) (Oral)   Resp 20   Ht 5' 2"$  (1.575 m)   Wt 124.7 kg   LMP 08/18/2012   SpO2 99%   BMI 50.30 kg/m  Physical Exam Vitals and nursing note reviewed.  Constitutional:      Appearance: She is well-developed.  HENT:     Head: Normocephalic and atraumatic.  Eyes:     Pupils: Pupils are equal, round, and reactive to light.  Cardiovascular:     Rate and Rhythm: Normal rate and regular rhythm.  Pulmonary:     Effort: No respiratory distress.     Breath sounds: No stridor.  Abdominal:     General: Abdomen is flat. There is no distension.  Musculoskeletal:     Cervical back: Normal range of motion.  Skin:    General: Skin is warm and dry.  Neurological:     General: No focal  deficit present.     Mental Status: She is alert.     ED Results / Procedures / Treatments   Labs (all labs ordered are listed, but only abnormal results are displayed) Labs Reviewed - No data to display  EKG None  Radiology CT Angio Chest PE W and/or Wo Contrast  Result Date: 12/28/2022 CLINICAL DATA:  Concern for pulmonary embolism. EXAM: CT ANGIOGRAPHY CHEST WITH CONTRAST TECHNIQUE: Multidetector CT imaging of the chest was performed using the standard protocol during bolus administration of intravenous contrast. Multiplanar CT image reconstructions and MIPs were obtained to evaluate the vascular anatomy. RADIATION DOSE REDUCTION: This exam was performed according to the departmental dose-optimization program which includes automated exposure control, adjustment of the mA and/or kV  according to patient size and/or use of iterative reconstruction technique. CONTRAST:  80m OMNIPAQUE IOHEXOL 350 MG/ML SOLN COMPARISON:  Chest CT 09/11/2019. FINDINGS: Evaluation of this exam is limited due to respiratory motion artifact. Cardiovascular: Top-normal cardiac size. There is no pericardial effusion. The thoracic aorta is unremarkable. The origins of the great vessels of the aortic arch appear patent. No pulmonary artery embolus identified. Mediastinum/Nodes: No hilar or mediastinal adenopathy the esophagus is grossly unremarkable. No mediastinal fluid collection. Lungs/Pleura: Cluster of ground-glass nodular density in the right middle lobe consistent with an infectious/inflammatory etiology, possibly atypical. Clinical correlation is recommended. No focal consolidation, pleural effusion, or pneumothorax. The central airways are patent. Upper Abdomen: No acute abnormality. Musculoskeletal: There is a 1.3 cm nodular density in the subcutaneous soft tissues of the midline anterior chest wall which may represent a lymph node. This was present on the prior CT, indicative of a benign etiology. No acute osseous pathology. Review of the MIP images confirms the above findings. IMPRESSION: 1. No CT evidence of pulmonary artery embolus. 2. Cluster of ground-glass nodular density in the right middle lobe consistent with an infectious/inflammatory etiology. Clinical correlation is recommended. Electronically Signed   By: AAnner CreteM.D.   On: 12/28/2022 23:46    Procedures Procedures    Medications Ordered in ED Medications  iohexol (OMNIPAQUE) 350 MG/ML injection 75 mL (75 mLs Intravenous Contrast Given 12/28/22 2327)    ED Course/ Medical Decision Making/ A&P                             Medical Decision Making Amount and/or Complexity of Data Reviewed Radiology: ordered.  Risk Prescription drug management.   CTA done and showed no obvious PE, however radiology noted a  inflammatory/infectious area in R mid lung (independently viewed and interpreted by myself and radiology read reviewed).  No infectious symptoms to suggest inflammatory finding is infectious so we will hold on antibiotics and have her follow-up with PCP for further workup and management of her symptoms.  Final Clinical Impression(s) / ED Diagnoses Final diagnoses:  SOB (shortness of breath)    Rx / DC Orders ED Discharge Orders     None         Fairley Copher, JCorene Cornea MD 12/29/22 0002

## 2023-01-04 ENCOUNTER — Emergency Department (HOSPITAL_COMMUNITY)
Admission: EM | Admit: 2023-01-04 | Discharge: 2023-01-04 | Disposition: A | Discharge status: Home / Self Care | Payer: Commercial Managed Care - PPO | Attending: Emergency Medicine | Admitting: Emergency Medicine

## 2023-01-04 ENCOUNTER — Other Ambulatory Visit: Payer: Self-pay

## 2023-01-04 ENCOUNTER — Emergency Department (HOSPITAL_COMMUNITY): Payer: Commercial Managed Care - PPO

## 2023-01-04 ENCOUNTER — Encounter (HOSPITAL_COMMUNITY): Payer: Self-pay

## 2023-01-04 DIAGNOSIS — R0600 Dyspnea, unspecified: Secondary | ICD-10-CM | POA: Insufficient documentation

## 2023-01-04 DIAGNOSIS — Z1152 Encounter for screening for COVID-19: Secondary | ICD-10-CM | POA: Diagnosis not present

## 2023-01-04 DIAGNOSIS — Z91010 Allergy to peanuts: Secondary | ICD-10-CM | POA: Diagnosis not present

## 2023-01-04 DIAGNOSIS — R0602 Shortness of breath: Secondary | ICD-10-CM | POA: Diagnosis present

## 2023-01-04 DIAGNOSIS — E876 Hypokalemia: Secondary | ICD-10-CM | POA: Insufficient documentation

## 2023-01-04 LAB — BASIC METABOLIC PANEL
Anion gap: 11 (ref 5–15)
BUN: 12 mg/dL (ref 6–20)
CO2: 24 mmol/L (ref 22–32)
Calcium: 8.8 mg/dL — ABNORMAL LOW (ref 8.9–10.3)
Chloride: 106 mmol/L (ref 98–111)
Creatinine, Ser: 0.8 mg/dL (ref 0.44–1.00)
GFR, Estimated: >60 mL/min (ref >60)
Glucose, Bld: 98 mg/dL (ref 70–99)
Potassium: 3.2 mmol/L — ABNORMAL LOW (ref 3.5–5.1)
Sodium: 141 mmol/L (ref 135–145)

## 2023-01-04 LAB — CBC WITH DIFFERENTIAL/PLATELET
Abs Immature Granulocytes: 0.04 10*3/uL (ref 0.00–0.07)
Basophils Absolute: 0 10*3/uL (ref 0.0–0.1)
Basophils Relative: 0 %
Eosinophils Absolute: 0.1 10*3/uL (ref 0.0–0.5)
Eosinophils Relative: 1 %
HCT: 39.6 % (ref 36.0–46.0)
Hemoglobin: 12.7 g/dL (ref 12.0–15.0)
Immature Granulocytes: 0 %
Lymphocytes Relative: 27 %
Lymphs Abs: 2.7 10*3/uL (ref 0.7–4.0)
MCH: 29.2 pg (ref 26.0–34.0)
MCHC: 32.1 g/dL (ref 30.0–36.0)
MCV: 91 fL (ref 80.0–100.0)
Monocytes Absolute: 0.6 10*3/uL (ref 0.1–1.0)
Monocytes Relative: 6 %
Neutro Abs: 6.6 10*3/uL (ref 1.7–7.7)
Neutrophils Relative %: 66 %
Platelets: 209 10*3/uL (ref 150–400)
RBC: 4.35 MIL/uL (ref 3.87–5.11)
RDW: 13.5 % (ref 11.5–15.5)
WBC: 10.1 10*3/uL (ref 4.0–10.5)
nRBC: 0 % (ref 0.0–0.2)

## 2023-01-04 LAB — TROPONIN I (HIGH SENSITIVITY)
Troponin I (High Sensitivity): 4 ng/L (ref <18)
Troponin I (High Sensitivity): 4 ng/L (ref <18)

## 2023-01-04 LAB — RESP PANEL BY RT-PCR (RSV, FLU A&B, COVID)  RVPGX2
Influenza A by PCR: NEGATIVE
Influenza B by PCR: NEGATIVE
Resp Syncytial Virus by PCR: NEGATIVE
SARS Coronavirus 2 by RT PCR: NEGATIVE

## 2023-01-04 LAB — BRAIN NATRIURETIC PEPTIDE: B Natriuretic Peptide: 28.5 pg/mL (ref 0.0–100.0)

## 2023-01-04 MED ORDER — IOHEXOL 350 MG/ML SOLN
75.0000 mL | Freq: Once | INTRAVENOUS | Status: AC | PRN
  Administered 2023-01-04: 75 mL via INTRAVENOUS

## 2023-01-04 MED ORDER — POTASSIUM CHLORIDE CRYS ER 20 MEQ PO TBCR
40.0000 meq | EXTENDED_RELEASE_TABLET | Freq: Once | ORAL | Status: AC
  Administered 2023-01-04: 40 meq via ORAL
  Filled 2023-01-04: qty 2

## 2023-01-04 NOTE — ED Notes (Signed)
Patient transported to CT 

## 2023-01-04 NOTE — ED Triage Notes (Signed)
Pt arrives via EMS from her work. Pt reports ongoing sob for over a week. Recently had a ct of her chest with no evidence of a PE. Pt states her PCP gave her some abx and a steroid but the sob worsened and she began experiencing dizziness this morning. Pt denies cp or any other symptoms.

## 2023-01-04 NOTE — ED Provider Notes (Signed)
Patient's workup here reviewed and no acute findings to explain her dyspnea.  We discharged home   Lacretia Leigh, MD 01/04/23 [phone]

## 2023-01-04 NOTE — Discharge Instructions (Addendum)
Your laboratory workup, EKG and CT imaging were all reassuring.  A referral has been placed for follow-up with cardiology and a referral has been placed for follow-up with pulmonology.  Both of the specialist can help to further evaluate your dyspnea.  You have no evidence of blood clot, ruptured lung, pneumonia or lung mass on your CT imaging.  You could benefit from an outpatient echocardiogram which the cardiologist or your PCP can schedule.

## 2023-01-04 NOTE — ED Provider Notes (Signed)
[facility] Provider Note   CSN: [id number] Arrival date & time: 01/04/23  [id number]     History  Chief Complaint  Patient presents with   Alexandra of [place] is a 57 y.o. female.   Alexandra Stein Associated symptoms: chest pain      57 year old female with medical history significant for bipolar disorder, HLD, GERD with recent presentation to the emergency department after an elevated D-dimer during which time she had CTA PE study on 12/28/2022 which revealed no evidence of PE but a groundglass nodular density in the right middle lobe consistent with an infectious and inflammatory etiology.  She was started on azithromycin by her PCP.  Since that ER visit, she states that she has had mild pleuritic chest discomfort.  She denies any fevers or chills.  She denies any cough.  She states that she has had worsening Alexandra Stein which prompted her visit to the emergency department today.  She denies any lower extremity swelling.  Her chest discomfort is occasionally pleuritic on the right side but she also endorses a somewhat pressure sensation substernally.  Patient denies any new lower extremity edema and she states that she is on Lasix outpatient.  Home Medications Prior to Admission medications   Medication Sig Start Date End Date Taking? Authorizing Provider  acetaminophen (TYLENOL) 500 MG tablet Take 1,000 mg by mouth every 6 (six) hours as needed for moderate pain.    [provider]  ARIPiprazole (ABILIFY) 15 MG tablet Take 15 mg by mouth at bedtime. 08/29/19   [provider]  DULoxetine (CYMBALTA) 60 MG capsule Take 60 mg by mouth at bedtime.     [provider]  furosemide (LASIX) 40 MG tablet Take 40 mg by mouth every morning. 08/29/19   [provider]  methocarbamol (ROBAXIN) 750 MG tablet Take 1 tablet (750 mg total) by mouth 3 (three) times daily as needed (muscle  spasm/pain). 06/05/20   Lajean Saver, MD  naproxen (NAPROSYN) 500 MG tablet Take 500 mg by mouth 2 (two) times daily as needed. 11/01/19   [provider]  OLANZapine (ZYPREXA) 10 MG tablet Take 10 mg by mouth at bedtime. 08/29/19   [provider]  topiramate (TOPAMAX) 100 MG tablet Take 100 mg by mouth at bedtime.     [provider]  trimethoprim-polymyxin b (POLYTRIM) ophthalmic solution Place 1 drop into both eyes every 4 (four) hours. 03/31/20   Raylene Everts, MD  VYVANSE 70 MG capsule Take 70 mg by mouth every morning. 02/03/20   [provider]  omeprazole (PRILOSEC) 20 MG capsule Take 20 mg by mouth every morning. 07/18/19 03/31/20  [provider]      Allergies    Codeine and Peanut allergen powder-dnfp    Review of Systems   Review of Systems  Respiratory:  Positive for Alexandra Stein.   Cardiovascular:  Positive for chest pain.  All other systems reviewed and are negative.   Physical Exam Updated Vital Signs BP 116/72 (BP Location: Right Arm)   Pulse 77   Temp 98.2 F (36.8 C) (Oral)   Resp 18   LMP 08/18/2012   SpO2 96%  Physical Exam Vitals and nursing note reviewed.  Constitutional:      General: She is not in acute distress.    Appearance: She is well-developed.  HENT:     Head: Normocephalic and atraumatic.  Eyes:  Conjunctiva/sclera: Conjunctivae normal.  Neck:     Vascular: No JVD.  Cardiovascular:     Rate and Rhythm: Normal rate and regular rhythm.     Heart sounds: No murmur heard. Pulmonary:     Effort: Pulmonary effort is normal. No respiratory distress.     Stein sounds: Normal Stein sounds.  Abdominal:     Palpations: Abdomen is soft.     Tenderness: There is no abdominal tenderness.  Musculoskeletal:        General: No swelling.     Cervical back: Neck supple.     Right lower leg: No edema.     Left lower leg: No edema.  Skin:    General: Skin is warm and dry.     Capillary  Refill: Capillary refill takes less than 2 seconds.  Neurological:     Mental Status: She is alert.  Psychiatric:        Mood and Affect: Mood normal.     ED Results / Procedures / Treatments   Labs (all labs ordered are listed, but only abnormal results are displayed) Labs Reviewed  BASIC METABOLIC PANEL - Abnormal; Notable for the following components:      Result Value   Potassium 3.2 (*)    Calcium 8.8 (*)    All other components within normal limits  RESP PANEL BY RT-PCR (RSV, FLU A&B, COVID)  [id number]  CBC WITH DIFFERENTIAL/PLATELET  BRAIN NATRIURETIC PEPTIDE  TROPONIN I (HIGH SENSITIVITY)  TROPONIN I (HIGH SENSITIVITY)    EKG EKG Interpretation  Date/Time:  Wednesday January 04 2023 11:33:18 EST Ventricular Rate:  63 PR Interval:  156 QRS Duration: 94 QT Interval:  424 QTC Calculation: 433 R Axis:   23 Text Interpretation: Sinus rhythm with Premature atrial complexes Nonspecific T wave abnormality Abnormal ECG When compared with ECG of 04-Jan-2023 11:32, PREVIOUS ECG IS PRESENT Confirmed by Regan Lemming ([id number]) on 01/04/2023 12:53:09 PM  Radiology CT Angio Chest PE W and/or Wo Contrast  Result Date: 01/04/2023 CLINICAL DATA:  Alexandra Stein. EXAM: CT ANGIOGRAPHY CHEST WITH CONTRAST TECHNIQUE: Multidetector CT imaging of the chest was performed using the standard protocol during bolus administration of intravenous contrast. Multiplanar CT image reconstructions and MIPs were obtained to evaluate the vascular anatomy. RADIATION DOSE REDUCTION: This exam was performed according to the departmental dose-optimization program which includes automated exposure control, adjustment of the mA and/or kV according to patient size and/or use of iterative reconstruction technique. CONTRAST:  28m OMNIPAQUE IOHEXOL 350 MG/ML SOLN COMPARISON:  12/28/2022. FINDINGS: Cardiovascular: Negative for pulmonary embolus. Heart is enlarged. No pericardial effusion. Mediastinum/Nodes: No  pathologically enlarged mediastinal, hilar or axillary lymph nodes. Esophagus is grossly unremarkable. Lungs/Pleura: Image quality is degraded by expiratory phase imaging, creating added density in the lungs bilaterally. No airspace consolidation or pleural fluid. Airway is unremarkable. Upper Abdomen: Visualized portions of the liver, adrenal glands, left kidney, spleen, pancreas, stomach and bowel are grossly unremarkable. Cholecystectomy. Musculoskeletal: Degenerative changes in the spine. No worrisome lytic or sclerotic lesions. Review of the MIP images confirms the above findings. IMPRESSION: Negative for pulmonary embolus. Electronically Signed   By: MLorin PicketM.D.   On: 01/04/2023 14:00    Procedures Procedures    Medications Ordered in ED Medications  iohexol (OMNIPAQUE) 350 MG/ML injection 75 mL (75 mLs Intravenous Contrast Given 01/04/23 1344)  potassium chloride SA (KLOR-CON M) CR tablet 40 mEq (40 mEq Oral Given 01/04/23 1422)    ED Course/ Medical Decision Making/ A&P Clinical Course  as of 01/04/23 1538  Wed Jan 04, 2023  1404 Potassium(!): 3.2 [JL]    Clinical Course User Index [JL] Regan Lemming, MD                             Medical Decision Making Amount and/or Complexity of Data Reviewed Labs: ordered. Decision-making details documented in ED Course. Radiology: ordered.  Risk Prescription drug management.    57 year old female with medical history significant for bipolar disorder, HLD, GERD with recent presentation to the emergency department after an elevated D-dimer during which time she had CTA PE study on 12/28/2022 which revealed no evidence of PE but a groundglass nodular density in the right middle lobe consistent with an infectious and inflammatory etiology.  She was started on azithromycin by her PCP.  Since that ER visit, she states that she has had mild pleuritic chest discomfort.  She denies any fevers or chills.  She denies any cough.  She states  that she has had worsening Alexandra Stein which prompted her visit to the emergency department today.  She denies any lower extremity swelling.  Her chest discomfort is occasionally pleuritic on the right side but she also endorses a somewhat pressure sensation substernally.  Patient denies any new lower extremity edema and she states that she is on Lasix outpatient.  On arrival, the patient was afebrile, not tachycardic or tachypneic, normotensive, saturating 98% on room air.  Physical exam generally unremarkable.  Sinus rhythm noted on cardiac telemetry.  Patient endorsing worsening symptoms of dyspnea despite outpatient antibiotics.  She denies chest pain.  Differential diagnosis includes CHF, PE, pneumonia, pneumothorax, pericardial effusion, pleural effusion, viral infection.  EKG revealed sinus rhythm, ventricular rate 63, nonspecific T wave changes, and no acute STEMI, no significant abnormal intervals.  CTA PE study was reordered given the patient's persistent dyspnea: IMPRESSION:  Negative for pulmonary embolus.    Laboratory evaluation significant for CBC without a leukocytosis or anemia, BMP with mild hypokalemia 3.2, replenished orally, BNP unremarkable, initial troponin 4, COVID-19 and influenza PCR testing collected and was pending at time of signout.  Patient overall workup has been reassuring and points away from need for inpatient hospitalization.  I recommended the patient follow-up outpatient with cardiology for consideration for outpatient echocardiogram for further evaluation of her dyspnea.  A referral for outpatient follow-up with pulmonology was also placed.  Plan at time of signout was to follow-up the patient's COVID and influenza testing, plan for likely discharge with close specialty follow-up.   Final Clinical Impression(s) / ED Diagnoses Final diagnoses:  Dyspnea, unspecified type    Rx / DC Orders ED Discharge Orders          Ordered    Ambulatory  referral to Cardiology       Comments: If you have not heard from the Cardiology office within the next 72 hours please call [phone].   01/04/23 1535    Ambulatory referral to Pulmonology        01/04/23 1535              Regan Lemming, MD 01/04/23 1538

## 2023-01-12 ENCOUNTER — Ambulatory Visit: Payer: Commercial Managed Care - PPO | Admitting: Internal Medicine

## 2023-01-12 ENCOUNTER — Encounter: Payer: Self-pay | Admitting: Internal Medicine

## 2023-01-12 VITALS — BP 124/72 | HR 92 | Temp 98.2°F | Ht 63.0 in | Wt 281.2 lb

## 2023-01-12 DIAGNOSIS — R0602 Shortness of breath: Secondary | ICD-10-CM

## 2023-01-12 DIAGNOSIS — J301 Allergic rhinitis due to pollen: Secondary | ICD-10-CM | POA: Diagnosis not present

## 2023-01-12 MED ORDER — ALBUTEROL SULFATE HFA 108 (90 BASE) MCG/ACT IN AERS: 2.0000 | INHALATION_SPRAY | Freq: Four times a day (QID) | RESPIRATORY_TRACT | 5 refills | Status: AC | PRN

## 2023-01-12 MED ORDER — FLUTICASONE PROPIONATE 50 MCG/ACT NA SUSP: 1.0000 | Freq: Every day | NASAL | 2 refills | Status: AC

## 2023-01-12 MED ORDER — MONTELUKAST SODIUM 10 MG PO TABS: 10.0000 mg | ORAL_TABLET | Freq: Every day | ORAL | 11 refills | Status: DC

## 2023-01-12 NOTE — Patient Instructions (Addendum)
Please schedule follow up scheduled with myself in 3 months.  If my schedule is not open yet, we will contact you with a reminder closer to that time. Please call [phone] if you haven't heard from [place] a month before.   Before your next visit I would like you to have: Full set of PFTs - 1 hr, come early before next visit.  Start taking flonase and montelukast for allergies. Stop mucinex. Flonase - 1 spray on each side of your nose twice a day for first week, then 1 spray on each side.   Instructions for use: If you also use a saline nasal spray or rinse, use that first. Position the head with the chin slightly tucked. Use the right hand to spray into the left nostril and the right hand to spray into the left nostril.   Point the bottle away from the septum of your nose (cartilage that divides the two sides of your nose).  Hold the nostril closed on the opposite side from where you will spray Spray once and gently sniff to pull the medicine into the higher parts of your nose.  Don't sniff too hard as the medicine will drain down the back of your throat instead. Repeat with a second spray on the same side if prescribed. Repeat on the other side of your nose.  Follow up with cardiologist.   Take the albuterol rescue inhaler every 4 to 6 hours as needed for wheezing or shortness of breath. You can also take it 15 minutes before exercise or exertional activity. Side effects include heart racing or pounding, jitters or anxiety. If you have a history of an irregular heart rhythm, it can make this worse. Can also give some patients a hard time sleeping.  To inhale the aerosol using an inhaler, follow these steps:  Remove the protective dust cap from the end of the mouthpiece. If the dust cap was not placed on the mouthpiece, check the mouthpiece for dirt or other objects. Be sure that the canister is fully and firmly inserted in the mouthpiece. 2. If you are using the inhaler for the first time  or if you have not used the inhaler in more than 14 days, you will need to prime it. You may also need to prime the inhaler if it has been dropped. Ask your pharmacist or check the manufacturer's information if this happens. To prime the inhaler, shake it well and then press down on the canister 4 times to release 4 sprays into the air, away from your face. Be careful not to get albuterol in your eyes. 3. Shake the inhaler well. 4. Breathe out as completely as possible through your mouth. 4. Hold the canister with the mouthpiece on the bottom, facing you and the canister pointing upward. Place the open end of the mouthpiece into your mouth. Close your lips tightly around the mouthpiece. 6. Breathe in slowly and deeply through the mouthpiece.At the same time, press down once on the container to spray the medication into your mouth. 7. Try to hold your breath for 10 seconds. remove the inhaler, and breathe out slowly. 8. If you were told to use 2 puffs, wait 1 minute and then repeat steps 3-7. 9. Replace the protective cap on the inhaler. 10. Clean your inhaler regularly. Follow the manufacturer's directions carefully and ask your doctor or pharmacist if you have any questions about cleaning your inhaler.  Check the back of the inhaler to keep track of the total  number of doses left on the inhaler.

## 2023-01-12 NOTE — Progress Notes (Signed)
Alexandra Stein    [id number]    November 27, 1965  Primary Care Physician:Costella, Vista Mink, PA-C  Referring Physician: Regan Lemming, MD [address] [place],  [place] [address] Reason for Consultation: shortness of breath Date of Consultation: 01/12/2023  Chief complaint:   Chief Complaint  Patient presents with   Consult    SOB at anytime      HPI: Alexandra Stein is a 57 y.o. woman who presents for new patient evaluation for shortness of breath.   Symptoms are so severe it resulted in an ED visit end of feb 2024. CTPE was ordered due to elevated ddimer. Negative for PE. RML ground glass concerning for infectious/inflammatory etiology. Treated with azithromycin.  Respiratory virus panel/covid/flu negative.  BNP was low.  Vitals reassuring. Discharged with outpatient pulmonary follow up.  Echo in 2017 showed grade 1 diastolic dysfunction  Symptoms of dyspnea started a year ago and have worsened over the past month. She denies coughing, wheezing. Symptoms slowly progressing. She gets sob bending over to tie her shoe and has to gasp for air.   Has trouble keeping up at work with her class due to her symptoms.  No coughing or wheezing. No chest pain, pressure. She does have tightness in her chest.  She does have symptoms that have woken her up at night 3 times in the last month. Never taken breathing treatments or inhalers.   She takes lasix for ankle and wrist edema - takes 40 mg once a day. She has gained 20 lbs in the last year unintentionally.   She does have sinus congestion related to allergies - taking mucinex. Not helping. Thinks its related to pear trees blooming outside her home because this happens annually.   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  Smoking history: never smoker, no passive smoke exposure.   Social History   Occupational History   Occupation: Product manager: [employer]  Tobacco Use   Smoking status:  Never   Smokeless tobacco: Never  Substance and Sexual Activity   Alcohol use: No   Drug use: No   Sexual activity: Never    Birth control/protection: Pill    Relevant family history:  Family History  Problem Relation Age of Onset   Dilated cardiomyopathy Mother        with ICD   Heart disease Mother    Atrial fibrillation Mother    Lung cancer Maternal Grandfather        black lung   Cancer Maternal Grandfather    Diabetes type II Father    Cancer Father    Kidney failure Father    Dementia Maternal Grandmother    Cancer Maternal Grandmother    Stroke Maternal Grandmother    Breast cancer Other    Healthy Brother    Healthy Child        x3    Past Medical History:  Diagnosis Date   Bipolar 2 disorder ([place])    GERD (gastroesophageal reflux disease)    H/O chest pain    dx'd with GERD   History of syncope 2002   Vasovagal   Hyperlipidemia    Migraines    Potassium (K) deficiency    per pt K+ deficiency - ? from meds she takes ( none of which are diuretucs)   SVD (spontaneous vaginal delivery)    x 2    Past Surgical History:  Procedure Laterality  Date   CESAREAN SECTION     x 1   CHOLECYSTECTOMY  07/27/2012   Procedure: LAPAROSCOPIC CHOLECYSTECTOMY;  Surgeon: Harl Bowie, MD;  Location: [facility];  Service: General;  Laterality: N/A;   TUBAL LIGATION     x 2   WISDOM TOOTH EXTRACTION       Physical Exam: Blood pressure 124/72, pulse 92, temperature 98.2 F (36.8 C), temperature source Oral, height '5\' 3"'$  (1.6 m), weight 281 lb 3.2 oz (127.6 kg), last menstrual period 08/18/2012, SpO2 100 %. Gen:      No acute distress ENT:  mild nasal obstruction with erythema, swollen turbinades, R obstructed more than left. no nasal polyps, mucus membranes moist Lungs:    No increased respiratory effort, symmetric chest wall excursion, clear to auscultation bilaterally, no wheezes or crackles CV:         Regular rate and rhythm; no murmurs, rubs, or gallops.   Bilateral nonpitting lower extremity edema Abd:      + bowel sounds; soft, non-tender; no distension MSK: no acute synovitis of DIP or PIP joints, no mechanics hands.  Skin:      Warm and dry; no rashes Neuro: normal speech, no focal facial asymmetry Psych: alert and oriented x3, normal mood and affect   Data Reviewed/Medical Decision Making:  Independent interpretation of tests: Imaging:  Review of patient's CTPE Feb 2024 images revealed no PE, mild bibasilar atelectasis. The patient's images have been independently reviewed by me.    PFTs:   Echo 2017  - Left ventricle: The cavity size was normal. Wall thickness was    normal. Systolic function was normal. The estimated ejection    fraction was in the range of 50% to 55%. Wall motion was normal;    there were no regional wall motion abnormalities. Doppler    parameters are consistent with abnormal left ventricular    relaxation (grade 1 diastolic dysfunction).   Labs:  Lab Results  Component Value Date   NA 141 01/04/2023   K 3.2 (L) 01/04/2023   CO2 24 01/04/2023   GLUCOSE 98 01/04/2023   BUN 12 01/04/2023   CREATININE 0.80 01/04/2023   CALCIUM 8.8 (L) 01/04/2023   GFRNONAA >60 01/04/2023   Lab Results  Component Value Date   WBC 10.1 01/04/2023   HGB 12.7 01/04/2023   HCT 39.6 01/04/2023   MCV 91.0 01/04/2023   PLT 209 01/04/2023     Immunization status:  Immunization History  Administered Date(s) Administered   Influenza,inj,Quad PF,6+ Mos 06/24/2018   Influenza-Unspecified 07/20/2022     I reviewed prior external note(s) from ED visits, pcp  I reviewed the result(s) of the labs and imaging as noted above.   I have ordered PFT   Assessment:  Dyspnea - likely multifactorial from cardiac, weight gain, deconditioning, cannot exclude asthma. Chronic HFpEF Allergic rhinitis, seasonal  Plan/Recommendations:  Will proceed with trial of albuterol and PFTS for dyspnea.  Agree with cardiology evaluation  given family history, personal history of hfpef Montelukast and flonase for allergic rhinitis.   We discussed disease management and progression at length today.   Return to Care: Return in about 3 months (around 04/14/2023).  Lenice Llamas, MD Pulmonary and [place]  CC: Regan Lemming, MD

## 2023-01-17 ENCOUNTER — Ambulatory Visit (INDEPENDENT_AMBULATORY_CARE_PROVIDER_SITE_OTHER): Payer: Commercial Managed Care - PPO | Admitting: Internal Medicine

## 2023-01-17 DIAGNOSIS — R0602 Shortness of breath: Secondary | ICD-10-CM | POA: Diagnosis not present

## 2023-01-17 LAB — PULMONARY FUNCTION TEST
DL/VA % pred: 129 %
DL/VA: 5.52 ml/min/mmHg/L
DLCO cor % pred: 95 %
DLCO cor: 18.9 ml/min/mmHg
DLCO unc % pred: 93 %
DLCO unc: 18.49 ml/min/mmHg
FEF 25-75 Post: 2.36 L/sec
FEF 25-75 Pre: 1.2 L/sec
FEF2575-%Change-Post: 97 %
FEF2575-%Pred-Post: 95 %
FEF2575-%Pred-Pre: 48 %
FEV1-%Change-Post: 20 %
FEV1-%Pred-Post: 57 %
FEV1-%Pred-Pre: 47 %
FEV1-Post: 1.47 L
FEV1-Pre: 1.21 L
FEV1FVC-%Change-Post: 9 %
FEV1FVC-%Pred-Pre: 102 %
FEV6-%Change-Post: 10 %
FEV6-%Pred-Post: 51 %
FEV6-%Pred-Pre: 46 %
FEV6-Post: 1.65 L
FEV6-Pre: 1.49 L
FEV6FVC-%Change-Post: 0 %
FEV6FVC-%Pred-Post: 103 %
FEV6FVC-%Pred-Pre: 103 %
FVC-%Change-Post: 10 %
FVC-%Pred-Post: 50 %
FVC-%Pred-Pre: 45 %
FVC-Post: 1.65 L
FVC-Pre: 1.49 L
Post FEV1/FVC ratio: 89 %
Post FEV6/FVC ratio: 100 %
Pre FEV1/FVC ratio: 81 %
Pre FEV6/FVC Ratio: 100 %
RV % pred: 105 %
RV: 1.96 L
TLC % pred: 85 %
TLC: 4.18 L

## 2023-01-17 NOTE — Patient Instructions (Signed)
Full PFT Performed Today  

## 2023-01-17 NOTE — Progress Notes (Signed)
Full PFT Performed Today  

## 2023-01-30 ENCOUNTER — Other Ambulatory Visit: Payer: Self-pay

## 2023-01-30 ENCOUNTER — Encounter (HOSPITAL_COMMUNITY): Payer: Self-pay

## 2023-01-30 DIAGNOSIS — Z91010 Allergy to peanuts: Secondary | ICD-10-CM | POA: Insufficient documentation

## 2023-01-30 DIAGNOSIS — S80812A Abrasion, left lower leg, initial encounter: Secondary | ICD-10-CM | POA: Insufficient documentation

## 2023-01-30 DIAGNOSIS — W228XXA Striking against or struck by other objects, initial encounter: Secondary | ICD-10-CM | POA: Diagnosis not present

## 2023-01-30 NOTE — ED Triage Notes (Signed)
Pt reports she cut left lower leg on the hot water heater stand today and it won't stop bleeding.  Abrasion to left leg with bleeding controlled in triage.

## 2023-01-31 ENCOUNTER — Emergency Department (HOSPITAL_COMMUNITY)
Admission: EM | Admit: 2023-01-31 | Discharge: 2023-01-31 | Disposition: A | Discharge status: Home / Self Care | Payer: Commercial Managed Care - PPO | Attending: Emergency Medicine | Admitting: Emergency Medicine

## 2023-01-31 DIAGNOSIS — S80812A Abrasion, left lower leg, initial encounter: Secondary | ICD-10-CM

## 2023-01-31 NOTE — ED Provider Notes (Addendum)
[facility] Provider Note   CSN: [id number] Arrival date & time: 01/30/23  2253     History  Chief Complaint  Patient presents with   Abrasion    Alexandra Stein is a 57 y.o. female.  The history is provided by the patient.  She has history of hyperlipidemia, hypokalemia, bipolar disorder and states that she bumped her left lower leg on the corner of a metal cabinet and had some bleeding.  She difficulty controlling the bleeding.  Bleeding is since stopped, but she is concerned about possibly developing infection.  She is up-to-date on tetanus immunizations.  She is not on any anticoagulants or antiplatelet agents.   Home Medications Prior to Admission medications   Medication Sig Start Date End Date Taking? Authorizing Provider  acetaminophen (TYLENOL) 500 MG tablet Take 1,000 mg by mouth every 6 (six) hours as needed for moderate pain.    [provider]  albuterol (VENTOLIN HFA) 108 (90 Base) MCG/ACT inhaler Inhale 2 puffs into the lungs every 6 (six) hours as needed. 01/12/23   Spero Geralds, MD  ARIPiprazole (ABILIFY) 15 MG tablet Take 15 mg by mouth at bedtime. 08/29/19   [provider]  DULoxetine (CYMBALTA) 60 MG capsule Take 60 mg by mouth at bedtime.     [provider]  fluticasone (FLONASE) 50 MCG/ACT nasal spray Place 1 spray into both nostrils daily. 01/12/23   Spero Geralds, MD  furosemide (LASIX) 40 MG tablet Take 40 mg by mouth every morning. 08/29/19   [provider]  methocarbamol (ROBAXIN) 750 MG tablet Take 1 tablet (750 mg total) by mouth 3 (three) times daily as needed (muscle spasm/pain). 06/05/20   Lajean Saver, MD  montelukast (SINGULAIR) 10 MG tablet Take 1 tablet (10 mg total) by mouth at bedtime. 01/12/23   Spero Geralds, MD  naproxen (NAPROSYN) 500 MG tablet Take 500 mg by mouth 2 (two) times daily as needed. 11/01/19   [provider]  OLANZapine (ZYPREXA) 10 MG  tablet Take 10 mg by mouth at bedtime. 08/29/19   [provider]  topiramate (TOPAMAX) 100 MG tablet Take 100 mg by mouth at bedtime.     [provider]  trimethoprim-polymyxin b (POLYTRIM) ophthalmic solution Place 1 drop into both eyes every 4 (four) hours. 03/31/20   Raylene Everts, MD  omeprazole (PRILOSEC) 20 MG capsule Take 20 mg by mouth every morning. 07/18/19 03/31/20  [provider]      Allergies    Codeine and Peanut allergen powder-dnfp    Review of Systems   Review of Systems  All other systems reviewed and are negative.   Physical Exam Updated Vital Signs BP (!) 150/85   Pulse 73   Temp 98.2 F (36.8 C) (Temporal)   Resp 17   Ht 5\' 3"  (1.6 m)   Wt 125.6 kg   LMP 08/18/2012   SpO2 97%   BMI 49.07 kg/m  Physical Exam Vitals and nursing note reviewed.   57 year old female, resting comfortably and in no acute distress. Vital signs are significant for elevated blood pressure. Oxygen saturation is 97%, which is normal. Head is normocephalic and atraumatic. PERRLA, EOMI. Neck is nontender and supple without adenopathy or JVD. Back is nontender  Lungs are clear without rales, wheezes, or rhonchi. Chest is nontender. Heart has regular rate and rhythm without murmur. Abdomen is soft, flat, nontender. Extremities: Minor abrasion noted on the left lower  leg anteriorly.  No discrete laceration requiring suturing.  There is 1-2+ pitting edema bilaterally. Skin is warm and dry without rash. Neurologic: Mental status is normal, cranial nerves are intact, moves all extremities equally.    ED Results / Procedures / Treatments    Procedures Procedures    Medications Ordered in ED Medications - No data to display  ED Course/ Medical Decision Making/ A&P                             Medical Decision Making  Abrasion of the left lower leg.  I have advised the patient to apply over-the-counter antibiotic ointment once a day, keep the  wound covered until it is no longer draining.  Advised to watch for signs of infection and to see her primary care provider or urgent care or return to ED if signs of infection develop.  Final Clinical Impression(s) / ED Diagnoses Final diagnoses:  Abrasion, left lower leg, initial encounter    Rx / DC Orders ED Discharge Orders     None         Delora Fuel, MD [id number]    Delora Fuel, MD [id number] [phone]

## 2023-01-31 NOTE — Discharge Instructions (Signed)
Apply antibiotic ointment (bacitracin or Neosporin) once a day.  If signs of infection develop, see your primary care provider or go to an urgent care center or the emergency department get a prescription for antibiotics.

## 2023-02-02 ENCOUNTER — Encounter: Payer: Self-pay | Admitting: Interventional Cardiology

## 2023-02-02 ENCOUNTER — Ambulatory Visit: Payer: Commercial Managed Care - PPO | Attending: Interventional Cardiology | Admitting: Interventional Cardiology

## 2023-02-02 VITALS — BP 152/96 | HR 85 | Ht 63.0 in | Wt 282.0 lb

## 2023-02-02 DIAGNOSIS — R0602 Shortness of breath: Secondary | ICD-10-CM | POA: Diagnosis not present

## 2023-02-02 DIAGNOSIS — R030 Elevated blood-pressure reading, without diagnosis of hypertension: Secondary | ICD-10-CM | POA: Diagnosis not present

## 2023-02-02 NOTE — Progress Notes (Signed)
Cardiology Office Note   Date:  02/02/2023   ID:  Maerene, Fougere [birth year], MRN [id number]  PCP:  Traci Sermon, PA-C    No chief complaint on file. DOE   [facility] Readings from Last 3 Encounters:  02/02/23 282 lb (127.9 kg)  01/30/23 277 lb (125.6 kg)  01/12/23 281 lb 3.2 oz (127.6 kg)       History of Present Illness: Alexandra Stein is a 57 y.o. female who is being seen today for the evaluation of DOE at the request of Regan Lemming, MD.   Normal stress test in 2012/02/07.  Mother died from heart problems at age 57/12/2011 cath done for persistent chest pain showed: "Normal left main coronary artery. Normal left anterior descending artery and its branches. Small, but patent, left circumflex artery and its branches.  No evidence of an anomalous circumflex. Normal, large, dominant right coronary artery. Normal left ventricular systolic function.  LVEDP 20 mmHg.  Ejection fraction 60 %."  DOE worsening over the past few months.  Several trips to the ER at [facility].  She saw a pulmonary MD and had PFTs. She was told her lungs were ok.    Chest x-ray in February 2024: "The heart size and mediastinal contours are within normal limits. Mild atelectasis is noted within the bilateral lung bases. There is no evidence of focal consolidation, pleural effusion or pneumothorax. Breast attenuation artifact is seen overlying the bilateral lung bases. The visualized skeletal structures are unremarkable.   IMPRESSION: No active cardiopulmonary disease."  She has not been exercising for about 1 year, she states from "laziness."  She has lost 12 lbs in the last few months.    BP tends to be low.  High today.    Past Medical History:  Diagnosis Date   Bipolar 2 disorder ([place])    Chest pain    GERD (gastroesophageal reflux disease)    H/O chest pain    dx'd with GERD   History of syncope 11/07/2000   Vasovagal   Hyperlipidemia    Migraines    Potassium (K)  deficiency    per pt K+ deficiency - ? from meds she takes ( none of which are diuretucs)   SOB (shortness of breath)    SVD (spontaneous vaginal delivery)    x 2   Syncope and collapse     Past Surgical History:  Procedure Laterality Date   CESAREAN SECTION     x 1   CHOLECYSTECTOMY  07/27/2012   Procedure: LAPAROSCOPIC CHOLECYSTECTOMY;  Surgeon: Harl Bowie, MD;  Location: [facility];  Service: General;  Laterality: N/A;   TUBAL LIGATION     x 2   WISDOM TOOTH EXTRACTION       Current Outpatient Medications  Medication Sig Dispense Refill   acetaminophen (TYLENOL) 500 MG tablet Take 1,000 mg by mouth every 6 (six) hours as needed for moderate pain.     albuterol (VENTOLIN HFA) 108 (90 Base) MCG/ACT inhaler Inhale 2 puffs into the lungs every 6 (six) hours as needed. 18 g 5   amphetamine-dextroamphetamine (ADDERALL XR) 20 MG 24 hr capsule Take 20 mg by mouth every morning.     ARIPiprazole (ABILIFY) 15 MG tablet Take 15 mg by mouth at bedtime.     DULoxetine (CYMBALTA) 60 MG capsule Take 60 mg by mouth at bedtime.      fluticasone (FLONASE) 50 MCG/ACT nasal spray Place 1 spray into both  nostrils daily. (Patient taking differently: Place 1 spray into both nostrils daily. PRN) 16 g 2   furosemide (LASIX) 40 MG tablet Take 40 mg by mouth every morning.     methocarbamol (ROBAXIN) 750 MG tablet Take 1 tablet (750 mg total) by mouth 3 (three) times daily as needed (muscle spasm/pain). 15 tablet 0   montelukast (SINGULAIR) 10 MG tablet Take 1 tablet (10 mg total) by mouth at bedtime. 30 tablet 11   naproxen (NAPROSYN) 500 MG tablet Take 500 mg by mouth 2 (two) times daily as needed.     OLANZapine (ZYPREXA) 10 MG tablet Take 10 mg by mouth at bedtime.     topiramate (TOPAMAX) 100 MG tablet Take 100 mg by mouth at bedtime.      traMADol (ULTRAM) 50 MG tablet Take 50 mg by mouth every 12 (twelve) hours as needed.     No current facility-administered medications for this visit.     Allergies:   Codeine and Peanut allergen powder-dnfp    Social History:  The patient  reports that she has never smoked. She has never used smokeless tobacco. She reports that she does not drink alcohol and does not use drugs.   Family History:  The patient's family history includes Atrial fibrillation in her mother; Breast cancer in an other family member; Cancer in her father, maternal grandfather, and maternal grandmother; Dementia in her maternal grandmother; Diabetes type II in her father; Dilated cardiomyopathy in her mother; Healthy in her brother and child; Heart disease in her mother; Kidney failure in her father; Lung cancer in her maternal grandfather; Stroke in her maternal grandmother.    ROS:  Please see the history of present illness.   Otherwise, review of systems are positive for intentional weight loss.   All other systems are reviewed and negative.    PHYSICAL EXAM: VS:  BP (!) 152/96   Pulse 85   Ht 5\' 3"  (1.6 m)   Wt 282 lb (127.9 kg)   LMP 08/18/2012   SpO2 97%   BMI 49.95 kg/m  , BMI Body mass index is 49.95 kg/m. GEN: Well nourished, well developed, in no acute distress HEENT: normal Neck: no JVD, carotid bruits, or masses Cardiac: RRR; no murmurs, rubs, or gallops,no edema  Respiratory:  clear to auscultation bilaterally, normal work of breathing GI: soft, nontender, nondistended, + BS MS: no deformity or atrophy Skin: warm and dry, no rash Neuro:  Strength and sensation are intact Psych: euthymic mood, full affect   EKG:   The ekg ordered 2/28 demonstrates NSR, PACs   Recent Labs: 01/04/2023: B Natriuretic Peptide 28.5; BUN 12; Creatinine, Ser 0.80; Hemoglobin 12.7; Platelets 209; Potassium 3.2; Sodium 141   Lipid Panel    Component Value Date/Time   CHOL 169 01/18/2016 0552   TRIG 91 01/18/2016 0552   HDL 55 01/18/2016 0552   CHOLHDL 3.1 01/18/2016 0552   VLDL 18 01/18/2016 0552   LDLCALC 96 01/18/2016 0552     Other studies  Reviewed: Additional studies/ records that were reviewed today with results demonstrating: labs reviewed from ER.   ASSESSMENT AND PLAN:  DOE: Check echo. No evidence of volume overload.  If the LVEF, is low then we may pursue further testing.  If the heart function is normal, can probably start trying to gradually increase exercise to build up stamina.  THe gap of 1 year of not much exercise may be playing a part Morbid obesity: Doing herbalife.  Has lost some weight  recently.  Continue healthy lifestyle. Elevated blood pressure.  Has not had HTN diagnosed before.  Check BP at home after relaxing for 10 minutes. Continue lifestyle changes.  Minimizing salt in the diet can help BP.  May need medication if blood pressures remain high.   Current medicines are reviewed at length with the patient today.  The patient concerns regarding her medicines were addressed.  The following changes have been made:  No change  Labs/ tests ordered today include:  No orders of the defined types were placed in this encounter.   Recommend 150 minutes/week of aerobic exercise Low fat, low carb, high fiber diet recommended  Disposition:   FU as needed.   Signed, Larae Grooms, MD  02/02/2023 2:26 PM    [address], [place], [place]  [address] Phone: [phone]; Fax: [phone]

## 2023-02-02 NOTE — Patient Instructions (Signed)
Medication Instructions:  Your physician recommends that you continue on your current medications as directed. Please refer to the Current Medication list given to you today.  *If you need a refill on your cardiac medications before your next appointment, please call your pharmacy*   Lab Work: none If you have labs (blood work) drawn today and your tests are completely normal, you will receive your results only by: MyChart Message (if you have MyChart) OR A paper copy in the mail If you have any lab test that is abnormal or we need to change your treatment, we will call you to review the results.   Testing/Procedures: Your physician has requested that you have an echocardiogram. Echocardiography is a painless test that uses sound waves to create images of your heart. It provides your doctor with information about the size and shape of your heart and how well your heart's chambers and valves are working. This procedure takes approximately one hour. There are no restrictions for this procedure. Please do NOT wear cologne, perfume, aftershave, or lotions (deodorant is allowed). Please arrive 15 minutes prior to your appointment time.    Follow-Up: At [facility], you and your health needs are our priority.  As part of our continuing mission to provide you with exceptional heart care, we have created designated Provider Care Teams.  These Care Teams include your primary Cardiologist (physician) and Advanced Practice Providers (APPs -  Physician Assistants and Nurse Practitioners) who all work together to provide you with the care you need, when you need it.  We recommend signing up for the patient portal called "MyChart".  Sign up information is provided on this After Visit Summary.  MyChart is used to connect with patients for Virtual Visits (Telemedicine).  Patients are able to view lab/test results, encounter notes, upcoming appointments, etc.  Non-urgent messages can be sent to your  provider as well.   To learn more about what you can do with MyChart, go to [email or web address].com.    Your next appointment:   As needed  Provider:   Jayadeep Varanasi, MD     Other Instructions    

## 2023-02-15 ENCOUNTER — Emergency Department (HOSPITAL_BASED_OUTPATIENT_CLINIC_OR_DEPARTMENT_OTHER): Payer: Commercial Managed Care - PPO | Admitting: Radiology

## 2023-02-15 ENCOUNTER — Encounter (HOSPITAL_BASED_OUTPATIENT_CLINIC_OR_DEPARTMENT_OTHER): Payer: Self-pay | Admitting: Emergency Medicine

## 2023-02-15 ENCOUNTER — Emergency Department (HOSPITAL_BASED_OUTPATIENT_CLINIC_OR_DEPARTMENT_OTHER): Payer: Commercial Managed Care - PPO

## 2023-02-15 ENCOUNTER — Emergency Department (HOSPITAL_BASED_OUTPATIENT_CLINIC_OR_DEPARTMENT_OTHER)
Admission: EM | Admit: 2023-02-15 | Discharge: 2023-02-15 | Disposition: A | Discharge status: Home / Self Care | Payer: Commercial Managed Care - PPO | Attending: Emergency Medicine | Admitting: Emergency Medicine

## 2023-02-15 ENCOUNTER — Other Ambulatory Visit: Payer: Self-pay

## 2023-02-15 DIAGNOSIS — Z91010 Allergy to peanuts: Secondary | ICD-10-CM | POA: Insufficient documentation

## 2023-02-15 DIAGNOSIS — M7989 Other specified soft tissue disorders: Secondary | ICD-10-CM | POA: Diagnosis not present

## 2023-02-15 DIAGNOSIS — R0602 Shortness of breath: Secondary | ICD-10-CM | POA: Insufficient documentation

## 2023-02-15 DIAGNOSIS — R0789 Other chest pain: Secondary | ICD-10-CM | POA: Diagnosis not present

## 2023-02-15 LAB — CBC WITH DIFFERENTIAL/PLATELET
Abs Immature Granulocytes: 0.02 10*3/uL (ref 0.00–0.07)
Basophils Absolute: 0 10*3/uL (ref 0.0–0.1)
Basophils Relative: 0 %
Eosinophils Absolute: 0.2 10*3/uL (ref 0.0–0.5)
Eosinophils Relative: 2 %
HCT: 43.2 % (ref 36.0–46.0)
Hemoglobin: 13.8 g/dL (ref 12.0–15.0)
Immature Granulocytes: 0 %
Lymphocytes Relative: 29 %
Lymphs Abs: 1.8 10*3/uL (ref 0.7–4.0)
MCH: 28.6 pg (ref 26.0–34.0)
MCHC: 31.9 g/dL (ref 30.0–36.0)
MCV: 89.6 fL (ref 80.0–100.0)
Monocytes Absolute: 0.4 10*3/uL (ref 0.1–1.0)
Monocytes Relative: 7 %
Neutro Abs: 4 10*3/uL (ref 1.7–7.7)
Neutrophils Relative %: 62 %
Platelets: 258 10*3/uL (ref 150–400)
RBC: 4.82 MIL/uL (ref 3.87–5.11)
RDW: 13.2 % (ref 11.5–15.5)
WBC: 6.5 10*3/uL (ref 4.0–10.5)
nRBC: 0 % (ref 0.0–0.2)

## 2023-02-15 LAB — BASIC METABOLIC PANEL
Anion gap: 8 (ref 5–15)
BUN: 17 mg/dL (ref 6–20)
CO2: 30 mmol/L (ref 22–32)
Calcium: 9.5 mg/dL (ref 8.9–10.3)
Chloride: 101 mmol/L (ref 98–111)
Creatinine, Ser: 0.8 mg/dL (ref 0.44–1.00)
GFR, Estimated: >60 mL/min (ref >60)
Glucose, Bld: 127 mg/dL — ABNORMAL HIGH (ref 70–99)
Potassium: 3.2 mmol/L — ABNORMAL LOW (ref 3.5–5.1)
Sodium: 139 mmol/L (ref 135–145)

## 2023-02-15 LAB — BRAIN NATRIURETIC PEPTIDE: B Natriuretic Peptide: 12.1 pg/mL (ref 0.0–100.0)

## 2023-02-15 LAB — D-DIMER, QUANTITATIVE: D-Dimer, Quant: 0.52 ug/mL-FEU — ABNORMAL HIGH (ref 0.00–0.50)

## 2023-02-15 MED ORDER — ALBUTEROL SULFATE HFA 108 (90 BASE) MCG/ACT IN AERS
2.0000 | INHALATION_SPRAY | RESPIRATORY_TRACT | Status: DC | PRN
  Administered 2023-02-15: 2 via RESPIRATORY_TRACT
  Filled 2023-02-15: qty 6.7

## 2023-02-15 MED ORDER — IOHEXOL 350 MG/ML SOLN
100.0000 mL | Freq: Once | INTRAVENOUS | Status: AC | PRN
  Administered 2023-02-15: 75 mL via INTRAVENOUS

## 2023-02-15 NOTE — ED Notes (Signed)
Patient given discharge instructions. Questions were answered. Patient verbalized understanding of discharge instructions and care at home.  

## 2023-02-15 NOTE — Discharge Instructions (Addendum)
Your CT scan did not show evidence of blood clot. Follow-up with your primary doctor and your scheduled formal echo.

## 2023-02-15 NOTE — ED Notes (Signed)
ED Provider at bedside attempting US IV. °

## 2023-02-15 NOTE — ED Notes (Signed)
Patient transported to CT 

## 2023-02-15 NOTE — ED Notes (Signed)
Pt ambulatory with steady gait to restroom. Specimen collection cup provided 

## 2023-02-15 NOTE — ED Provider Notes (Signed)
[place] [facility]   CSN: [id number] Arrival date & time: 02/15/23  [id number]     History  Chief Complaint  Patient presents with   Shortness of Breath    Alexandra Stein is a 57 y.o. female.  Patient presents with recurrent shortness of breath.  Patient has seen pulmonology and had normal pulmonary function test and saw cardiology who did not recommend further testing except for echo the end of this month.  Patient's had mild leg swelling both legs.  Patient denies blood clot risk factors.  No history of known lung disease or heart problems.  No fevers chills or cough.  Worse with exertion and at times with lying flat.  Patient has tried to lose weight see if that would help she is lost 13 pounds.       Home Medications Prior to Admission medications   Medication Sig Start Date End Date Taking? Authorizing Provider  acetaminophen (TYLENOL) 500 MG tablet Take 1,000 mg by mouth every 6 (six) hours as needed for moderate pain.    [provider]  albuterol (VENTOLIN HFA) 108 (90 Base) MCG/ACT inhaler Inhale 2 puffs into the lungs every 6 (six) hours as needed. 01/12/23   Charlott Holleresai, Nikita S, MD  amphetamine-dextroamphetamine (ADDERALL XR) 20 MG 24 hr capsule Take 20 mg by mouth every morning. 12/26/22   [provider]  ARIPiprazole (ABILIFY) 15 MG tablet Take 15 mg by mouth at bedtime. 08/29/19   [provider]  DULoxetine (CYMBALTA) 60 MG capsule Take 60 mg by mouth at bedtime.     [provider]  fluticasone (FLONASE) 50 MCG/ACT nasal spray Place 1 spray into both nostrils daily. Patient taking differently: Place 1 spray into both nostrils daily. PRN 01/12/23   Charlott Holleresai, Nikita S, MD  furosemide (LASIX) 40 MG tablet Take 40 mg by mouth every morning. 08/29/19   [provider]  methocarbamol (ROBAXIN) 750 MG tablet Take 1 tablet (750 mg total) by mouth 3 (three) times daily as needed (muscle  spasm/pain). 06/05/20   Cathren LaineSteinl, Kevin, MD  montelukast (SINGULAIR) 10 MG tablet Take 1 tablet (10 mg total) by mouth at bedtime. 01/12/23   Charlott Holleresai, Nikita S, MD  naproxen (NAPROSYN) 500 MG tablet Take 500 mg by mouth 2 (two) times daily as needed. 11/01/19   [provider]  OLANZapine (ZYPREXA) 10 MG tablet Take 10 mg by mouth at bedtime. 08/29/19   [provider]  topiramate (TOPAMAX) 100 MG tablet Take 100 mg by mouth at bedtime.     [provider]  traMADol (ULTRAM) 50 MG tablet Take 50 mg by mouth every 12 (twelve) hours as needed. 12/15/21   [provider]  omeprazole (PRILOSEC) 20 MG capsule Take 20 mg by mouth every morning. 07/18/19 03/31/20  [provider]      Allergies    Codeine and Peanut allergen powder-dnfp    Review of Systems   Review of Systems  Constitutional:  Negative for chills and fever.  HENT:  Negative for congestion and facial swelling.   Eyes:  Negative for visual disturbance.  Respiratory:  Positive for shortness of breath.   Cardiovascular:  Positive for chest pain (intermittent, non exertional).  Gastrointestinal:  Negative for abdominal pain and vomiting.  Genitourinary:  Negative for dysuria and flank pain.  Musculoskeletal:  Negative for back pain, neck pain and neck stiffness.  Skin:  Negative for rash.  Neurological:  Negative for light-headedness and headaches.  Physical Exam Updated Vital Signs BP (!) 112/55   Pulse 92   Temp 98.1 F (36.7 C) (Oral)   Resp 11   Ht 5\' 3"  (1.6 m)   Wt 121.6 kg   LMP 08/18/2012   SpO2 90%   BMI 47.47 kg/m  Physical Exam  ED Results / Procedures / Treatments   Labs (all labs ordered are listed, but only abnormal results are displayed) Labs Reviewed  D-DIMER, QUANTITATIVE - Abnormal; Notable for the following components:      Result Value   D-Dimer, Quant 0.52 (*)    All other components within normal limits  BASIC METABOLIC PANEL - Abnormal; Notable for the  following components:   Potassium 3.2 (*)    Glucose, Bld 127 (*)    All other components within normal limits  CBC WITH DIFFERENTIAL/PLATELET  BRAIN NATRIURETIC PEPTIDE    EKG None  Radiology CT Angio Chest PE W and/or Wo Contrast  Result Date: 02/15/2023 CLINICAL DATA:  Shortness of breath. EXAM: CT ANGIOGRAPHY CHEST WITH CONTRAST TECHNIQUE: Multidetector CT imaging of the chest was performed using the standard protocol during bolus administration of intravenous contrast. Multiplanar CT image reconstructions and MIPs were obtained to evaluate the vascular anatomy. RADIATION DOSE REDUCTION: This exam was performed according to the departmental dose-optimization program which includes automated exposure control, adjustment of the mA and/or kV according to patient size and/or use of iterative reconstruction technique. CONTRAST:  72mL OMNIPAQUE IOHEXOL 350 MG/ML SOLN COMPARISON:  CT examination dated January 04, 2023 FINDINGS: Cardiovascular: The heart is mildly enlarged. No pericardial effusion. Satisfactory opacification of the pulmonary arteries to the segmental level. No evidence of pulmonary embolism. Mediastinum/Nodes: No enlarged mediastinal, hilar, or axillary lymph nodes. Thyroid gland, trachea, and esophagus demonstrate no significant findings. Lungs/Pleura: Evaluation of lung parenchyma is somewhat limited due to low lung volumes in the expiratory phase. No focal consolidation or pleural effusion. Upper Abdomen: No acute abnormality. Musculoskeletal: Mild thoracic spondylosis. No acute osseous abnormality. No chest wall abnormality. Review of the MIP images confirms the above findings. IMPRESSION: 1. No evidence of pulmonary embolism. 2. No acute pulmonary process. 3. Mild cardiomegaly. Electronically Signed   By: Larose Hires D.O.   On: 02/15/2023 10:14   DG Chest 2 View  Result Date: 02/15/2023 CLINICAL DATA:  Shortness of breath EXAM: CHEST - 2 VIEW COMPARISON:  Chest radiograph  12/21/2022 FINDINGS: No pleural effusion. No pneumothorax. No focal airspace opacity. No radiographically apparent displaced rib fractures. Visualized upper abdomen is unremarkable. Vertebral body heights are maintained. IMPRESSION: No focal airspace opacity. Electronically Signed   By: Lorenza Cambridge M.D.   On: 02/15/2023 07:39    Procedures Ultrasound ED Echo  Date/Time: 02/15/2023 8:08 AM  Performed by: Blane Ohara, MD Authorized by: Blane Ohara, MD   Procedure details:    Indications: dyspnea     Views: parasternal long axis view and parasternal short axis view     Images: archived   Findings:    Pericardium: no pericardial effusion     LV Function: normal (>50% EF)     RV Diameter: normal   Impression:    Impression: normal       Medications Ordered in ED Medications  albuterol (VENTOLIN HFA) 108 (90 Base) MCG/ACT inhaler 2 puff (2 puffs Inhalation Given 02/15/23 0758)  iohexol (OMNIPAQUE) 350 MG/ML injection 100 mL (75 mLs Intravenous Contrast Given 02/15/23 0950)    ED Course/ Medical Decision Making/ A&P  Medical Decision Making Amount and/or Complexity of Data Reviewed Labs: ordered. Radiology: ordered.  Risk Prescription drug management.   Patient presents with recurrent shortness of breath and intermittent atypical chest discomfort.  Patient does have obesity/hyperlipidemia as risk factors.  Plan for screening troponin, patient's had this recurrently for a week so single troponin would be adequate.  BNP to look for signs of heart failure, chest x-ray independently reviewed no acute fluid or cardiomegaly.  Bedside ultrasound performed and normal systolic ejection fraction without pericardial effusion.  Patient low risk for blood clots, D-dimer pending.  General blood work ordered to look for signs of anemia or electrolyte or kidney problems.  Patient comfortable this plan and outpatient follow-up discussed.  EKG reviewed  independently sinus rhythm, no acute ST elevation, mild artifact at baseline making difficult to tell QT interval, no ST depression. CT angiogram reviewed results independently no signs of blood clots or other acute abnormalities.  Patient well-appearing on reassessment.  Patient stable for discharge.        Final Clinical Impression(s) / ED Diagnoses Final diagnoses:  Shortness of breath    Rx / DC Orders ED Discharge Orders     None         Blane Ohara, MD 02/15/23 1045

## 2023-02-15 NOTE — ED Triage Notes (Signed)
Pt POV from home, caox4, ambulatory c/o recurrent dyspnea on exertion and while laying flat. Pt reports she was evaluated months ago for same complaint with no findings however SOB still comes and goes. Pt reports this most recent episode started last night and also c/o pain all over. Pt denies fever, cough, N/V/D.

## 2023-03-06 ENCOUNTER — Ambulatory Visit (HOSPITAL_COMMUNITY): Payer: Commercial Managed Care - PPO

## 2023-03-22 ENCOUNTER — Telehealth (HOSPITAL_COMMUNITY): Payer: Self-pay | Admitting: Interventional Cardiology

## 2023-03-22 NOTE — Telephone Encounter (Signed)
Just an FYI. We have made several attempts to contact this patient including sending a letter to schedule or reschedule their echocardiogram. We will be removing the patient from the echo WQ.     03/20/23 [facility] to schedule x 3 @ 10:27/LBW  03/15/23 [facility] to schedule @ 9:18/LBW  03/10/23 [facility] to schedule @ 10:06/LBW  03/03/23 Pts husband called and cancelled and pt will call back due to she is out of town. LBW 2:46      Thank you

## 2023-08-07 ENCOUNTER — Emergency Department (HOSPITAL_COMMUNITY): Payer: Commercial Managed Care - PPO

## 2023-08-07 ENCOUNTER — Emergency Department (HOSPITAL_COMMUNITY)
Admission: EM | Admit: 2023-08-07 | Discharge: 2023-08-08 | Disposition: A | Discharge status: Home / Self Care | Payer: Commercial Managed Care - PPO | Attending: Emergency Medicine | Admitting: Emergency Medicine

## 2023-08-07 DIAGNOSIS — R42 Dizziness and giddiness: Secondary | ICD-10-CM | POA: Insufficient documentation

## 2023-08-07 DIAGNOSIS — R200 Anesthesia of skin: Secondary | ICD-10-CM | POA: Diagnosis not present

## 2023-08-07 DIAGNOSIS — Z8616 Personal history of COVID-19: Secondary | ICD-10-CM | POA: Diagnosis not present

## 2023-08-07 DIAGNOSIS — R531 Weakness: Secondary | ICD-10-CM | POA: Diagnosis not present

## 2023-08-07 DIAGNOSIS — Z91010 Allergy to peanuts: Secondary | ICD-10-CM | POA: Diagnosis not present

## 2023-08-07 DIAGNOSIS — R29818 Other symptoms and signs involving the nervous system: Secondary | ICD-10-CM | POA: Diagnosis not present

## 2023-08-07 DIAGNOSIS — E876 Hypokalemia: Secondary | ICD-10-CM | POA: Diagnosis not present

## 2023-08-07 DIAGNOSIS — R930 Abnormal findings on diagnostic imaging of skull and head, not elsewhere classified: Secondary | ICD-10-CM | POA: Insufficient documentation

## 2023-08-07 DIAGNOSIS — Z79899 Other long term (current) drug therapy: Secondary | ICD-10-CM | POA: Insufficient documentation

## 2023-08-07 DIAGNOSIS — R2990 Unspecified symptoms and signs involving the nervous system: Secondary | ICD-10-CM

## 2023-08-07 LAB — CBC
HCT: 43.8 % (ref 36.0–46.0)
Hemoglobin: 14 g/dL (ref 12.0–15.0)
MCH: 28.6 pg (ref 26.0–34.0)
MCHC: 32 g/dL (ref 30.0–36.0)
MCV: 89.4 fL (ref 80.0–100.0)
Platelets: 249 10*3/uL (ref 150–400)
RBC: 4.9 MIL/uL (ref 3.87–5.11)
RDW: 13.7 % (ref 11.5–15.5)
WBC: 8.9 10*3/uL (ref 4.0–10.5)
nRBC: 0 % (ref 0.0–0.2)

## 2023-08-07 LAB — BASIC METABOLIC PANEL
Anion gap: 8 (ref 5–15)
BUN: 16 mg/dL (ref 6–20)
CO2: 25 mmol/L (ref 22–32)
Calcium: 9.3 mg/dL (ref 8.9–10.3)
Chloride: 105 mmol/L (ref 98–111)
Creatinine, Ser: 0.85 mg/dL (ref 0.44–1.00)
GFR, Estimated: >60 mL/min (ref >60)
Glucose, Bld: 89 mg/dL (ref 70–99)
Potassium: 3.2 mmol/L — ABNORMAL LOW (ref 3.5–5.1)
Sodium: 138 mmol/L (ref 135–145)

## 2023-08-07 LAB — CBG MONITORING, ED: Glucose-Capillary: 85 mg/dL (ref 70–99)

## 2023-08-07 MED ORDER — LORAZEPAM 1 MG PO TABS: 0.5000 mg | ORAL_TABLET | ORAL | Status: DC | PRN

## 2023-08-07 NOTE — ED Triage Notes (Signed)
Pt to ED via [facility] from [facility]. Pt c/o episodes of dizziness, left cheek and bilateral arm numbness, and left sided facial droop since Saturday. Pt endorses sharp headache while having episodes, but denies headache once other symptoms have resolved. LKW 2200 on 08/05/23. Pt reports episodes lasting less than 10 minutes each time. Pt reports having 4-5 episodes each day. Pt had episode of dizziness and numbness in left cheek , no facial droop, with EMS en route that lasted 2-3 minutes. Pt denies any previous hx of same. Pt endorses nausea when episodes occur.   EMS: 134/70 70 HR 18 RR 100% RA 97 CBG

## 2023-08-07 NOTE — ED Provider Triage Note (Signed)
Emergency Medicine Provider Triage Evaluation Note  [name] , a 57 y.o. female  was evaluated in triage.  Pt complains of left-sided facial numbness, facial droop, room spinning dizziness.  Review of Systems  Positive: Facial droop, facial numbness, left arm numbness, room spinning dizziness, shortness of breath, nausea Negative: Chest pain  Physical Exam  BP (!) 151/78 (BP Location: Right Arm)   Pulse 68   Temp 97.8 F (36.6 C) (Oral)   Resp 19   LMP 08/18/2012   SpO2 100%  Gen:   Awake, no distress   Resp:  Normal effort  MSK:   Moves extremities without difficulty  Neuro:  Cranial nerves II through XII intact, no focal deficit noted, 5 out of 5 strength in the bilateral upper and lower extremities, intact sensation to light touch bilaterally, no dysmetria.  Medical Decision Making  Medically screening exam initiated at 6:03 PM.  Appropriate orders placed.  [name] was informed that the remainder of the evaluation will be completed by another provider, this initial triage assessment does not replace that evaluation, and the importance of remaining in the ED until their evaluation is complete.  57 year old female presenting to the emerged part with intermittent episodes of room spinning dizziness, left-sided facial droop with associated left sided facial numbness with left arm numbness.  [place] been ongoing since this past Saturday.  She also endorses sharp headache when symptoms come on.  Her last known well was around 2200 on 08/05/2023.  Episodes last around 10 minutes each time.  4-5 episodes noted per day.  She is currently asymptomatic.  TIA workup initiated, patient cleared for the lobby as she is currently asymptomatic and with a normal neurologic exam.   Ernie Avena, MD 08/07/23 1806

## 2023-08-08 MED ORDER — MECLIZINE HCL 25 MG PO TABS: 25.0000 mg | ORAL_TABLET | Freq: Three times a day (TID) | ORAL | 0 refills | Status: DC | PRN

## 2023-08-08 NOTE — Discharge Instructions (Signed)
Thank you for allowing [place] to take care of you today.  We hope you begin feeling better soon.  To-Do: Please follow-up with your primary doctor. Please return to the Emergency Department or call 911 if you experience chest pain, shortness of breath, severe pain, severe fever, altered mental status, or have any reason to think that you need emergency medical care.  Thank you again.  Hope you feel better soon.  Department of Emergency Medicine [facility]

## 2023-08-08 NOTE — ED Provider Notes (Signed)
[place] EMERGENCY DEPARTMENT AT [facility] Provider Note  CSN: [id number] Arrival date & time: 08/07/23 1706  Chief Complaint(s) Aphasia, Dizziness, and Numbness  HPI Alexandra Stein is a 57 y.o. female with a past medical history listed below including bipolar 2 disorder, migraine headaches who presents to the emergency department for several episodes of vertiginous symptoms with reported left face droop and drooling and left upper extremity weakness.  Patient reports that the first episode occurred Saturday.  Episode lasted 4 to 5 minutes.  Afterward patient felt generally fatigued.  After taking a nap, she felt better.  Sunday she had 2 additional episodes that were less severe and both lasting 4 to 5 minutes as well.  This morning patient woke up feeling well with slight dizziness.  Around 2 PM, she had another episode that was much stronger and lasted slightly longer.  She called her PCP who recommended she be seen at the urgent care who transported here by EMS.  During those times, she did not feel any headache, chest pain, shortness of breath, palpitations, nausea.  She denies any recent fevers or infections.  No cough or congestion.  No vomiting.  No diarrhea.  No other physical complaints.  Currently she denies any symptoms.  The history is provided by the patient.    Past Medical History Past Medical History:  Diagnosis Date   Bipolar 2 disorder (HCC)    Chest pain    GERD (gastroesophageal reflux disease)    H/O chest pain    dx'd with GERD   History of syncope 11/07/2000   Vasovagal   Hyperlipidemia    Migraines    Potassium (K) deficiency    per pt K+ deficiency - ? from meds she takes ( none of which are diuretucs)   SOB (shortness of breath)    SVD (spontaneous vaginal delivery)    x 2   Syncope and collapse    Patient Active Problem List   Diagnosis Date Noted   Memory loss 02/01/2022   Daytime somnolence 12/15/2021   Acute respiratory disease due  to COVID-19 virus 09/10/2019   Obesity, Class III, BMI 40-49.9 (morbid obesity) (HCC) 09/10/2019   Chest pain at rest 05/22/2019   Attention deficit hyperactivity disorder (ADHD), predominantly inattentive type 09/18/2017   Depression 01/18/2016   Syncope 01/18/2016   Hyperlipidemia    GERD (gastroesophageal reflux disease)    Alteration of consciousness    Symptomatic cholelithiasis 07/26/2012   Bipolar disorder (HCC) 07/26/2012   Chest pain 12/12/2011   Hypotension 12/12/2011   Home Medication(s) Prior to Admission medications   Medication Sig Start Date End Date Taking? Authorizing Provider  meclizine (ANTIVERT) 25 MG tablet Take 1 tablet (25 mg total) by mouth 3 (three) times daily as needed for dizziness. 08/08/23  Yes Nori Winegar, Amadeo Garnet, MD  acetaminophen (TYLENOL) 500 MG tablet Take 1,000 mg by mouth every 6 (six) hours as needed for moderate pain.    [provider]  albuterol (VENTOLIN HFA) 108 (90 Base) MCG/ACT inhaler Inhale 2 puffs into the lungs every 6 (six) hours as needed. 01/12/23   Charlott Holler, MD  amphetamine-dextroamphetamine (ADDERALL XR) 20 MG 24 hr capsule Take 20 mg by mouth every morning. 12/26/22   [provider]  ARIPiprazole (ABILIFY) 15 MG tablet Take 15 mg by mouth at bedtime. 08/29/19   [provider]  DULoxetine (CYMBALTA) 60 MG capsule Take 60 mg by mouth at bedtime.     [provider]  fluticasone (FLONASE) 50 MCG/ACT nasal spray Place 1 spray into both nostrils daily. Patient taking differently: Place 1 spray into both nostrils daily. PRN 01/12/23   Charlott Holler, MD  furosemide (LASIX) 40 MG tablet Take 40 mg by mouth every morning. 08/29/19   [provider]  methocarbamol (ROBAXIN) 750 MG tablet Take 1 tablet (750 mg total) by mouth 3 (three) times daily as needed (muscle spasm/pain). 06/05/20   Cathren Laine, MD  montelukast (SINGULAIR) 10 MG tablet Take 1 tablet (10 mg total) by mouth at bedtime.  01/12/23   Charlott Holler, MD  naproxen (NAPROSYN) 500 MG tablet Take 500 mg by mouth 2 (two) times daily as needed. 11/01/19   [provider]  OLANZapine (ZYPREXA) 10 MG tablet Take 10 mg by mouth at bedtime. 08/29/19   [provider]  topiramate (TOPAMAX) 100 MG tablet Take 100 mg by mouth at bedtime.     [provider]  traMADol (ULTRAM) 50 MG tablet Take 50 mg by mouth every 12 (twelve) hours as needed. 12/15/21   [provider]  omeprazole (PRILOSEC) 20 MG capsule Take 20 mg by mouth every morning. 07/18/19 03/31/20  [provider]                                                                                                                                    Allergies Codeine and Peanut allergen powder-dnfp  Review of Systems Review of Systems As noted in HPI  Physical Exam Vital Signs  I have reviewed the triage vital signs BP 133/80   Pulse 76   Temp (!) 97.4 F (36.3 C)   Resp 16   LMP 08/18/2012   SpO2 98%   Physical Exam Vitals reviewed.  Constitutional:      General: She is not in acute distress.    Appearance: She is well-developed. She is not diaphoretic.  HENT:     Head: Normocephalic and atraumatic.     Right Ear: External ear normal.     Left Ear: External ear normal.     Nose: Nose normal.  Eyes:     General: No scleral icterus.    Conjunctiva/sclera: Conjunctivae normal.  Neck:     Trachea: Phonation normal.  Cardiovascular:     Rate and Rhythm: Normal rate and regular rhythm.  Pulmonary:     Effort: Pulmonary effort is normal. No respiratory distress.     Breath sounds: No stridor.  Abdominal:     General: There is no distension.  Musculoskeletal:        General: Normal range of motion.     Cervical back: Normal range of motion.  Neurological:     Mental Status: She is alert and oriented to person, place, and time.     Comments: Mental Status:  Alert and oriented to person, place, and time.   Attention and concentration normal.  Speech clear.  Recent  memory is intact  Cranial Nerves:  II Visual Fields: Intact to confrontation. Visual fields intact. III, IV, VI: Pupils equal and reactive to light and near. Full eye movement without nystagmus  V Facial Sensation: Normal. No weakness of masticatory muscles  VII: No facial weakness or asymmetry  VIII Auditory Acuity: Grossly normal  IX/X: The uvula is midline; the palate elevates symmetrically  XI: Normal sternocleidomastoid and trapezius strength  XII: The tongue is midline. No atrophy or fasciculations.   Motor System: Muscle Strength: 5/5 and symmetric in the upper and lower extremities. No pronation or drift.  Muscle Tone: Tone and muscle bulk are normal in the upper and lower extremities.  Coordination:  No tremor.  Sensation: Intact to light touchGait: Routine gait normal.   Psychiatric:        Behavior: Behavior normal.     ED Results and Treatments Labs (all labs ordered are listed, but only abnormal results are displayed) Labs Reviewed  BASIC METABOLIC PANEL - Abnormal; Notable for the following components:      Result Value   Potassium 3.2 (*)    All other components within normal limits  CBC  URINALYSIS, ROUTINE W REFLEX MICROSCOPIC  RAPID URINE DRUG SCREEN, HOSP PERFORMED  CBG MONITORING, ED                                                                                                                         EKG  EKG Interpretation Date/Time:  Monday August 07 2023 17:15:16 EDT Ventricular Rate:  65 PR Interval:  168 QRS Duration:  98 QT Interval:  426 QTC Calculation: 443 R Axis:   34  Text Interpretation: Normal sinus rhythm Nonspecific T wave abnormality Abnormal ECG When compared with ECG of 04-Jan-2023 11:33, PREVIOUS ECG IS PRESENT No acute changes Confirmed by Drema Pry [phone]) on 08/08/2023 1:27:39 AM       Radiology MR ANGIO HEAD WO CONTRAST  Result Date:  08/07/2023 CLINICAL DATA:  Dizziness EXAM: MRA HEAD WITHOUT CONTRAST TECHNIQUE: Angiographic images of the Circle of Willis were acquired using MRA technique without intravenous contrast. COMPARISON:  None Available. FINDINGS: POSTERIOR CIRCULATION: --Vertebral arteries: Normal --Inferior cerebellar arteries: Normal. --Basilar artery: Normal. --Superior cerebellar arteries: Normal. --Posterior cerebral arteries: Normal. ANTERIOR CIRCULATION: --Intracranial internal carotid arteries: Normal. --Anterior cerebral arteries (ACA): Normal. --Middle cerebral arteries (MCA): Normal. Anatomic variants: None Other: None. IMPRESSION: Normal intracranial MRA. Electronically Signed   By: Deatra Robinson M.D.   On: 08/07/2023 22:50   MR MRA NECK WO CONTRAST  Result Date: 08/07/2023 CLINICAL DATA:  Transient ischemic attack and dizziness EXAM: MRA NECK WITHOUT CONTRAST TECHNIQUE: Angiographic images of the neck were acquired using MRA technique without intravenous contrast. Carotid stenosis measurements (when applicable) are obtained utilizing NASCET criteria, using the distal internal carotid diameter as the denominator. COMPARISON:  None Available. FINDINGS: Aortic arch: Normal.  Three vessel branching. Right carotid system: No occlusion or stenosis Left carotid system: No occlusion or stenosis Vertebral arteries: Right  dominant system. No occlusion or stenosis. Other: None. IMPRESSION: Normal MRA of the neck. Electronically Signed   By: Deatra Robinson M.D.   On: 08/07/2023 22:48   MR BRAIN WO CONTRAST  Result Date: 08/07/2023 CLINICAL DATA:  Dizziness EXAM: MRI HEAD WITHOUT CONTRAST TECHNIQUE: Multiplanar, multiecho pulse sequences of the brain and surrounding structures were obtained without intravenous contrast. COMPARISON:  03/07/2022 FINDINGS: Brain: No acute infarct, mass effect or extra-axial collection. No chronic microhemorrhage or siderosis. Normal white matter signal, parenchymal volume and CSF spaces. The  midline structures are normal. Vascular: Normal flow voids. Skull and upper cervical spine: Normal marrow signal. Sinuses/Orbits: Retention cysts at the floors of the maxillary sinuses. Other: None. IMPRESSION: Normal MRI of the brain. Electronically Signed   By: Deatra Robinson M.D.   On: 08/07/2023 22:44   CT HEAD WO CONTRAST  Result Date: 08/07/2023 CLINICAL DATA:  TIA. Dizziness. Left face and bilateral arm numbness. Facial droop. EXAM: CT HEAD WITHOUT CONTRAST TECHNIQUE: Contiguous axial images were obtained from the base of the skull through the vertex without intravenous contrast. RADIATION DOSE REDUCTION: This exam was performed according to the departmental dose-optimization program which includes automated exposure control, adjustment of the mA and/or kV according to patient size and/or use of iterative reconstruction technique. COMPARISON:  12/18/2020 FINDINGS: Brain: No acute intracranial abnormality. Specifically, no hemorrhage, hydrocephalus, mass lesion, acute infarction, or significant intracranial injury. Vascular: No hyperdense vessel or unexpected calcification. Skull: No acute calvarial abnormality. Sinuses/Orbits: No acute findings Other: None IMPRESSION: Normal study. Electronically Signed   By: Charlett Nose M.D.   On: 08/07/2023 22:10   DG Chest 2 View  Result Date: 08/07/2023 CLINICAL DATA:  Dizziness short of breath EXAM: CHEST - 2 VIEW COMPARISON:  02/15/2023 FINDINGS: The heart size and mediastinal contours are within normal limits. Both lungs are clear. The visualized skeletal structures are unremarkable. IMPRESSION: No active cardiopulmonary disease. Electronically Signed   By: Jasmine Pang M.D.   On: 08/07/2023 19:47    Medications Ordered in ED Medications  LORazepam (ATIVAN) tablet 0.5 mg (has no administration in time range)   Procedures Procedures  (including critical care time) Medical Decision Making / ED Course   Medical Decision Making Amount and/or  Complexity of Data Reviewed Labs: ordered. Decision-making details documented in ED Course. Radiology: ordered and independent interpretation performed. Decision-making details documented in ED Course. ECG/medicine tests: ordered and independent interpretation performed. Decision-making details documented in ED Course.    Patient seen and MSE process and had appropriate labs and imaging obtained.  Workup was negative for CVA or acute vascular process in the head or neck.  This was confirmed by negative MRIs as noted above.  No ICH noted.  No severe anemia.  Patient does have mild hypokalemia but no other significant electrolyte derangements or renal sufficiency.  Blood glucose level was normal.   Discussed possibility of hypoglycemic episodes.  Patient reports that she has freestyle libre at home which she will use to monitor her sugars.  EKG without acute ischemic changes, dysrhythmias or blocks.  Unlikely cardiac etiology.  Also discussed possibility of this being vertigo or complex migraine headaches.  Recommended she follow-up closely with her PCP for further evaluation and referral to appropriate services.       Final Clinical Impression(s) / ED Diagnoses Final diagnoses:  Dizziness  Episode of transient neurologic symptoms   The patient appears reasonably screened and/or stabilized for discharge and I doubt any other medical condition or other [facility] requiring  further screening, evaluation, or treatment in the ED at this time. I have discussed the findings, Dx and Tx plan with the patient/family who expressed understanding and agree(s) with the plan. Discharge instructions discussed at length. The patient/family was given strict return precautions who verbalized understanding of the instructions. No further questions at time of discharge.  Disposition: Discharge  Condition: Good  ED Discharge Orders          Ordered    meclizine (ANTIVERT) 25 MG tablet  3 times daily PRN         08/08/23 0221             Follow Up: Alyson Ingles, PA-C [address] [place] [address] [phone]  Call  to schedule an appointment for close follow up    This chart was dictated using voice recognition software.  Despite best efforts to proofread,  errors can occur which can change the documentation meaning.    Nira Conn, MD 08/08/23 Earle Gell

## 2023-08-29 LAB — COLOGUARD: COLOGUARD: NEGATIVE

## 2023-08-29 LAB — EXTERNAL GENERIC LAB PROCEDURE: COLOGUARD: NEGATIVE

## 2023-09-26 ENCOUNTER — Emergency Department (HOSPITAL_COMMUNITY): Payer: Commercial Managed Care - PPO

## 2023-09-26 ENCOUNTER — Emergency Department (HOSPITAL_COMMUNITY)
Admission: EM | Admit: 2023-09-26 | Discharge: 2023-09-26 | Disposition: A | Discharge status: Home / Self Care | Payer: Commercial Managed Care - PPO | Attending: Emergency Medicine | Admitting: Emergency Medicine

## 2023-09-26 ENCOUNTER — Other Ambulatory Visit: Payer: Self-pay

## 2023-09-26 ENCOUNTER — Encounter (HOSPITAL_COMMUNITY): Payer: Self-pay | Admitting: Emergency Medicine

## 2023-09-26 DIAGNOSIS — E669 Obesity, unspecified: Secondary | ICD-10-CM | POA: Insufficient documentation

## 2023-09-26 DIAGNOSIS — J069 Acute upper respiratory infection, unspecified: Secondary | ICD-10-CM | POA: Diagnosis present

## 2023-09-26 DIAGNOSIS — Z1152 Encounter for screening for COVID-19: Secondary | ICD-10-CM | POA: Insufficient documentation

## 2023-09-26 DIAGNOSIS — Z6841 Body Mass Index (BMI) 40.0 and over, adult: Secondary | ICD-10-CM | POA: Insufficient documentation

## 2023-09-26 LAB — RESP PANEL BY RT-PCR (RSV, FLU A&B, COVID)  RVPGX2
Influenza A by PCR: NEGATIVE
Influenza B by PCR: NEGATIVE
Resp Syncytial Virus by PCR: NEGATIVE
SARS Coronavirus 2 by RT PCR: NEGATIVE

## 2023-09-26 MED ORDER — KETOROLAC TROMETHAMINE 30 MG/ML IJ SOLN
30.0000 mg | Freq: Once | INTRAMUSCULAR | Status: AC
  Administered 2023-09-26: 30 mg via INTRAMUSCULAR
  Filled 2023-09-26: qty 1

## 2023-09-26 MED ORDER — BENZONATATE 100 MG PO CAPS: 100.0000 mg | ORAL_CAPSULE | Freq: Three times a day (TID) | ORAL | 0 refills | Status: AC

## 2023-09-26 MED ORDER — BENZONATATE 100 MG PO CAPS
100.0000 mg | ORAL_CAPSULE | Freq: Once | ORAL | Status: AC
  Administered 2023-09-26: 100 mg via ORAL
  Filled 2023-09-26: qty 1

## 2023-09-26 NOTE — ED Provider Notes (Signed)
[place] EMERGENCY DEPARTMENT AT [facility] Provider Note   CSN: [id number] Arrival date & time: 09/26/23  0000     History  Chief Complaint  Patient presents with   Cough    Alexandra Stein is a 57 y.o. female.  HPI     This is a 57 year old female who presents with upper respiratory symptoms.  Patient reports onset of symptoms yesterday.  She developed congestion and drainage.  She secondarily reports that she began to have a sore throat and a cough.  She states it hurts when she coughs and when she breathes.  She is not coughing up anything.  No noted fevers at home.  No known sick contacts.  She has taken some over-the-counter NyQuil with minimal relief.  Home Medications Prior to Admission medications   Medication Sig Start Date End Date Taking? Authorizing Provider  benzonatate (TESSALON) 100 MG capsule Take 1 capsule (100 mg total) by mouth every 8 (eight) hours. 09/26/23  Yes Kalima Saylor, Mayer Masker, MD  acetaminophen (TYLENOL) 500 MG tablet Take 1,000 mg by mouth every 6 (six) hours as needed for moderate pain.    [provider]  albuterol (VENTOLIN HFA) 108 (90 Base) MCG/ACT inhaler Inhale 2 puffs into the lungs every 6 (six) hours as needed. 01/12/23   Charlott Holler, MD  amphetamine-dextroamphetamine (ADDERALL XR) 20 MG 24 hr capsule Take 20 mg by mouth every morning. 12/26/22   [provider]  ARIPiprazole (ABILIFY) 15 MG tablet Take 15 mg by mouth at bedtime. 08/29/19   [provider]  DULoxetine (CYMBALTA) 60 MG capsule Take 60 mg by mouth at bedtime.     [provider]  fluticasone (FLONASE) 50 MCG/ACT nasal spray Place 1 spray into both nostrils daily. Patient taking differently: Place 1 spray into both nostrils daily. PRN 01/12/23   Charlott Holler, MD  furosemide (LASIX) 40 MG tablet Take 40 mg by mouth every morning. 08/29/19   [provider]  meclizine (ANTIVERT) 25 MG tablet Take 1 tablet (25 mg total) by  mouth 3 (three) times daily as needed for dizziness. 08/08/23   Cardama, Amadeo Garnet, MD  methocarbamol (ROBAXIN) 750 MG tablet Take 1 tablet (750 mg total) by mouth 3 (three) times daily as needed (muscle spasm/pain). 06/05/20   Cathren Laine, MD  montelukast (SINGULAIR) 10 MG tablet Take 1 tablet (10 mg total) by mouth at bedtime. 01/12/23   Charlott Holler, MD  naproxen (NAPROSYN) 500 MG tablet Take 500 mg by mouth 2 (two) times daily as needed. 11/01/19   [provider]  OLANZapine (ZYPREXA) 10 MG tablet Take 10 mg by mouth at bedtime. 08/29/19   [provider]  topiramate (TOPAMAX) 100 MG tablet Take 100 mg by mouth at bedtime.     [provider]  traMADol (ULTRAM) 50 MG tablet Take 50 mg by mouth every 12 (twelve) hours as needed. 12/15/21   [provider]  omeprazole (PRILOSEC) 20 MG capsule Take 20 mg by mouth every morning. 07/18/19 03/31/20  [provider]      Allergies    Codeine and Peanut allergen powder-dnfp    Review of Systems   Review of Systems  Constitutional:  Negative for fever.  HENT:  Positive for congestion and sore throat.   Respiratory:  Positive for cough.   Cardiovascular:  Negative for chest pain.  All other systems reviewed and are negative.   Physical Exam Updated Vital Signs BP 121/73 (BP Location:  Right Arm)   Pulse 78   Temp 98.2 F (36.8 C) (Oral)   Resp 19   Ht 1.575 m (5\' 2" )   Wt 118.4 kg   LMP 08/18/2012   SpO2 96%   BMI 47.74 kg/m  Physical Exam Vitals and nursing note reviewed.  Constitutional:      Appearance: She is well-developed. She is obese. She is not ill-appearing.  HENT:     Head: Normocephalic and atraumatic.     Nose: Nose normal.     Mouth/Throat:     Mouth: Mucous membranes are moist.     Comments: Posterior oropharynx moist and clear, no erythema, no tonsillar exudate, no asymmetric tonsillar enlargement, uvula midline Eyes:     Pupils: Pupils are equal, round, and  reactive to light.  Cardiovascular:     Rate and Rhythm: Normal rate and regular rhythm.     Heart sounds: Normal heart sounds.  Pulmonary:     Effort: Pulmonary effort is normal. No respiratory distress.     Breath sounds: No wheezing.  Abdominal:     Palpations: Abdomen is soft.  Musculoskeletal:     Cervical back: Neck supple.  Skin:    General: Skin is warm and dry.  Neurological:     Mental Status: She is alert and oriented to person, place, and time.  Psychiatric:        Mood and Affect: Mood normal.     ED Results / Procedures / Treatments   Labs (all labs ordered are listed, but only abnormal results are displayed) Labs Reviewed  RESP PANEL BY RT-PCR (RSV, FLU A&B, COVID)  [id number]    EKG EKG Interpretation Date/Time:  Tuesday September 26 2023 00:11:12 EST Ventricular Rate:  79 PR Interval:  156 QRS Duration:  90 QT Interval:  368 QTC Calculation: 421 R Axis:   39  Text Interpretation: Normal sinus rhythm Nonspecific ST and T wave abnormality Abnormal ECG When compared with ECG of 07-Aug-2023 17:15, No significant change was found Confirmed by Ross Marcus ([id number]) on 09/26/2023 3:32:56 AM  Radiology DG Chest 2 View  Result Date: 09/26/2023 CLINICAL DATA:  Persistent cough, nasal drainage. EXAM: CHEST - 2 VIEW COMPARISON:  08/07/2023. FINDINGS: The heart size and mediastinal contours are within normal limits. No consolidation, effusion, or pneumothorax. No acute osseous abnormality. IMPRESSION: No active cardiopulmonary disease. Electronically Signed   By: Thornell Sartorius M.D.   On: 09/26/2023 01:49    Procedures Procedures    Medications Ordered in ED Medications  ketorolac (TORADOL) 30 MG/ML injection 30 mg (has no administration in time range)  benzonatate (TESSALON) capsule 100 mg (100 mg Oral Given 09/26/23 0409)    ED Course/ Medical Decision Making/ A&P                                 Medical Decision Making Amount and/or Complexity of Data  Reviewed Radiology: ordered.  Risk Prescription drug management.   This patient presents to the ED for concern of upper respiratory symptoms, this involves an extensive number of treatment options, and is a complaint that carries with it a high risk of complications and morbidity.  I considered the following differential and admission for this acute, potentially life threatening condition.  The differential diagnosis includes viral URI such as COVID or influenza, pneumonia  MDM:    This is a 57 year old female who presents with upper respiratory symptoms.  Overall nontoxic and  vital signs are reassuring.  She is afebrile.  COVID and influenza testing are negative.  Chest x-ray without obvious pneumonia.  EKG is reassuring.  Discussed with patient that I felt that this is likely a viral illness.  Supportive measures recommended.  (Labs, imaging, consults)  Labs: I Ordered, and personally interpreted labs.  The pertinent results include: COVID, influenza  Imaging Studies ordered: I ordered imaging studies including chest x-ray I independently visualized and interpreted imaging. I agree with the radiologist interpretation  Additional history obtained from chart review.  External records from outside source obtained and reviewed including prior evaluations  Cardiac Monitoring: The patient was maintained on a cardiac monitor.  If on the cardiac monitor, I personally viewed and interpreted the cardiac monitored which showed an underlying rhythm of: Sinus  Reevaluation: After the interventions noted above, I reevaluated the patient and found that they have :stayed the same  Social Determinants of Health:  lives independently  Disposition: Discharge  Co morbidities that complicate the patient evaluation  Past Medical History:  Diagnosis Date   Bipolar 2 disorder (HCC)    Chest pain    GERD (gastroesophageal reflux disease)    H/O chest pain    dx'd with GERD   History of syncope  11/07/2000   Vasovagal   Hyperlipidemia    Migraines    Potassium (K) deficiency    per pt K+ deficiency - ? from meds she takes ( none of which are diuretucs)   SOB (shortness of breath)    SVD (spontaneous vaginal delivery)    x 2   Syncope and collapse      Medicines Meds ordered this encounter  Medications   benzonatate (TESSALON) capsule 100 mg   ketorolac (TORADOL) 30 MG/ML injection 30 mg   benzonatate (TESSALON) 100 MG capsule    Sig: Take 1 capsule (100 mg total) by mouth every 8 (eight) hours.    Dispense:  21 capsule    Refill:  0    I have reviewed the patients home medicines and have made adjustments as needed  Problem List / ED Course: Problem List Items Addressed This Visit   None Visit Diagnoses     Viral upper respiratory tract infection    -  Primary                   Final Clinical Impression(s) / ED Diagnoses Final diagnoses:  Viral upper respiratory tract infection    Rx / DC Orders ED Discharge Orders          Ordered    benzonatate (TESSALON) 100 MG capsule  Every 8 hours        09/26/23 0410              Roderick Calo, Mayer Masker, MD 09/26/23 [phone]

## 2023-09-26 NOTE — Discharge Instructions (Signed)
You were seen today for upper respiratory symptoms.  This is likely viral in nature.  Take ibuprofen as needed for pain.  He will be given Tessalon Perles for cough.  Follow-up with primary doctor.

## 2023-09-26 NOTE — ED Triage Notes (Signed)
Pt c/o congestion, cough and hurts to breathe since yesterday.

## 2023-12-19 ENCOUNTER — Other Ambulatory Visit: Payer: Self-pay | Admitting: Internal Medicine

## 2024-03-10 ENCOUNTER — Emergency Department (HOSPITAL_COMMUNITY)

## 2024-03-10 ENCOUNTER — Other Ambulatory Visit: Payer: Self-pay

## 2024-03-10 ENCOUNTER — Emergency Department (HOSPITAL_COMMUNITY)
Admission: EM | Admit: 2024-03-10 | Discharge: 2024-03-10 | Disposition: A | Discharge status: Home / Self Care | Attending: Emergency Medicine | Admitting: Emergency Medicine

## 2024-03-10 ENCOUNTER — Encounter (HOSPITAL_COMMUNITY): Payer: Self-pay

## 2024-03-10 DIAGNOSIS — R42 Dizziness and giddiness: Secondary | ICD-10-CM | POA: Insufficient documentation

## 2024-03-10 DIAGNOSIS — Z91010 Allergy to peanuts: Secondary | ICD-10-CM | POA: Diagnosis not present

## 2024-03-10 DIAGNOSIS — J0130 Acute sphenoidal sinusitis, unspecified: Secondary | ICD-10-CM | POA: Insufficient documentation

## 2024-03-10 DIAGNOSIS — R300 Dysuria: Secondary | ICD-10-CM | POA: Insufficient documentation

## 2024-03-10 LAB — COMPREHENSIVE METABOLIC PANEL WITH GFR
ALT: 22 U/L (ref 0–44)
AST: 21 U/L (ref 15–41)
Albumin: 3.8 g/dL (ref 3.5–5.0)
Alkaline Phosphatase: 92 U/L (ref 38–126)
Anion gap: 10 (ref 5–15)
BUN: 14 mg/dL (ref 6–20)
CO2: 24 mmol/L (ref 22–32)
Calcium: 9.2 mg/dL (ref 8.9–10.3)
Chloride: 104 mmol/L (ref 98–111)
Creatinine, Ser: 0.74 mg/dL (ref 0.44–1.00)
GFR, Estimated: >60 mL/min (ref >60)
Glucose, Bld: 106 mg/dL — ABNORMAL HIGH (ref 70–99)
Potassium: 3.6 mmol/L (ref 3.5–5.1)
Sodium: 138 mmol/L (ref 135–145)
Total Bilirubin: 1 mg/dL (ref 0.0–1.2)
Total Protein: 7.5 g/dL (ref 6.5–8.1)

## 2024-03-10 LAB — CBC WITH DIFFERENTIAL/PLATELET
Abs Immature Granulocytes: 0.02 10*3/uL (ref 0.00–0.07)
Basophils Absolute: 0 10*3/uL (ref 0.0–0.1)
Basophils Relative: 0 %
Eosinophils Absolute: 0.1 10*3/uL (ref 0.0–0.5)
Eosinophils Relative: 2 %
HCT: 42.8 % (ref 36.0–46.0)
Hemoglobin: 14 g/dL (ref 12.0–15.0)
Immature Granulocytes: 0 %
Lymphocytes Relative: 30 %
Lymphs Abs: 2.2 10*3/uL (ref 0.7–4.0)
MCH: 30.2 pg (ref 26.0–34.0)
MCHC: 32.7 g/dL (ref 30.0–36.0)
MCV: 92.2 fL (ref 80.0–100.0)
Monocytes Absolute: 0.6 10*3/uL (ref 0.1–1.0)
Monocytes Relative: 8 %
Neutro Abs: 4.4 10*3/uL (ref 1.7–7.7)
Neutrophils Relative %: 60 %
Platelets: 231 10*3/uL (ref 150–400)
RBC: 4.64 MIL/uL (ref 3.87–5.11)
RDW: 13 % (ref 11.5–15.5)
WBC: 7.3 10*3/uL (ref 4.0–10.5)
nRBC: 0 % (ref 0.0–0.2)

## 2024-03-10 LAB — URINALYSIS, ROUTINE W REFLEX MICROSCOPIC
Bilirubin Urine: NEGATIVE
Glucose, UA: NEGATIVE mg/dL
Hgb urine dipstick: NEGATIVE
Ketones, ur: NEGATIVE mg/dL
Nitrite: NEGATIVE
Protein, ur: 30 mg/dL — AB
Specific Gravity, Urine: 1.026 (ref 1.005–1.030)
pH: 5 (ref 5.0–8.0)

## 2024-03-10 LAB — LACTIC ACID, PLASMA
Lactic Acid, Venous: 1.9 mmol/L (ref 0.5–1.9)
Lactic Acid, Venous: 1.9 mmol/L (ref 0.5–1.9)

## 2024-03-10 MED ORDER — MECLIZINE HCL 25 MG PO TABS
25.0000 mg | ORAL_TABLET | Freq: Three times a day (TID) | ORAL | 0 refills | Status: AC | PRN
Start: 2024-03-10 — End: ?

## 2024-03-10 MED ORDER — CEFDINIR 300 MG PO CAPS: 300.0000 mg | ORAL_CAPSULE | Freq: Two times a day (BID) | ORAL | 0 refills | Status: AC

## 2024-03-10 MED ORDER — IOHEXOL 300 MG/ML  SOLN
100.0000 mL | Freq: Once | INTRAMUSCULAR | Status: AC | PRN
  Administered 2024-03-10: 100 mL via INTRAVENOUS

## 2024-03-10 MED ORDER — MECLIZINE HCL 12.5 MG PO TABS
25.0000 mg | ORAL_TABLET | Freq: Once | ORAL | Status: AC
  Administered 2024-03-10: 25 mg via ORAL
  Filled 2024-03-10: qty 2

## 2024-03-10 MED ORDER — SODIUM CHLORIDE 0.9 % IV BOLUS
1000.0000 mL | Freq: Once | INTRAVENOUS | Status: AC
Start: 2024-03-10 — End: 2024-03-10
  Administered 2024-03-10: 1000 mL via INTRAVENOUS

## 2024-03-10 NOTE — ED Provider Notes (Signed)
 [place] EMERGENCY DEPARTMENT AT [facility] Provider Note   CSN: [id number] Arrival date & time: 03/10/24  1307     History  Chief Complaint  Patient presents with   Urinary Frequency    Alexandra Stein is a 58 y.o. female.  Patient is a 58 year old female who presents emergency department with a chief complaint of onset of dizziness this morning as well as feeling like she is experiencing brain fog.  Patient does admit to associated dysuria and difficulty with urination.  She does note that she was experiencing diarrhea yesterday but denies any nausea or vomiting.  She denies any abnormal headaches, pain to neck or back or abnormal rashes.  She does admit to discomfort in her lower abdomen.  She has had no associated fever or chills.   Urinary Frequency       Home Medications Prior to Admission medications   Medication Sig Start Date End Date Taking? Authorizing Provider  acetaminophen  (TYLENOL ) 500 MG tablet Take 1,000 mg by mouth every 6 (six) hours as needed for moderate pain.    [provider]  albuterol  (VENTOLIN  HFA) 108 (90 Base) MCG/ACT inhaler Inhale 2 puffs into the lungs every 6 (six) hours as needed. 01/12/23   Desai, Nikita S, MD  amphetamine-dextroamphetamine (ADDERALL XR) 20 MG 24 hr capsule Take 20 mg by mouth every morning. 12/26/22   [provider]  ARIPiprazole  (ABILIFY ) 15 MG tablet Take 15 mg by mouth at bedtime. 08/29/19   [provider]  benzonatate  (TESSALON ) 100 MG capsule Take 1 capsule (100 mg total) by mouth every 8 (eight) hours. 09/26/23   Horton, Vonzella Guernsey, MD  DULoxetine  (CYMBALTA ) 60 MG capsule Take 60 mg by mouth at bedtime.     [provider]  fluticasone  (FLONASE ) 50 MCG/ACT nasal spray Place 1 spray into both nostrils daily. Patient taking differently: Place 1 spray into both nostrils daily. PRN 01/12/23   Aleck Hurdle, MD  furosemide  (LASIX ) 40 MG tablet Take 40 mg by mouth every morning.  08/29/19   [provider]  meclizine  (ANTIVERT ) 25 MG tablet Take 1 tablet (25 mg total) by mouth 3 (three) times daily as needed for dizziness. 08/08/23   Lindle Rhea, MD  methocarbamol  (ROBAXIN ) 750 MG tablet Take 1 tablet (750 mg total) by mouth 3 (three) times daily as needed (muscle spasm/pain). 06/05/20   Guadalupe Lee, MD  montelukast  (SINGULAIR ) 10 MG tablet TAKE 1 TABLET BY MOUTH EVERYDAY AT BEDTIME 12/20/23   Desai, Nikita S, MD  naproxen  (NAPROSYN ) 500 MG tablet Take 500 mg by mouth 2 (two) times daily as needed. 11/01/19   [provider]  OLANZapine  (ZYPREXA ) 10 MG tablet Take 10 mg by mouth at bedtime. 08/29/19   [provider]  topiramate  (TOPAMAX ) 100 MG tablet Take 100 mg by mouth at bedtime.     [provider]  traMADol  (ULTRAM ) 50 MG tablet Take 50 mg by mouth every 12 (twelve) hours as needed. 12/15/21   [provider]  omeprazole (PRILOSEC) 20 MG capsule Take 20 mg by mouth every morning. 07/18/19 03/31/20  [provider]      Allergies    Codeine and Peanut allergen powder-dnfp    Review of Systems   Review of Systems  Genitourinary:  Positive for frequency.  All other systems reviewed and are negative.   Physical Exam Updated Vital Signs BP 137/73   Pulse 84   Temp 97.6 F (36.4 C) (Oral)  Ht 5\' 2"  (1.575 m)   Wt 118.4 kg   LMP 08/18/2012   SpO2 97%   BMI 47.74 kg/m  Physical Exam Vitals and nursing note reviewed.  Constitutional:      Appearance: Normal appearance.  HENT:     Head: Normocephalic and atraumatic.     Nose: Nose normal.     Mouth/Throat:     Mouth: Mucous membranes are moist.  Eyes:     Extraocular Movements: Extraocular movements intact.     Conjunctiva/sclera: Conjunctivae normal.     Pupils: Pupils are equal, round, and reactive to light.  Cardiovascular:     Rate and Rhythm: Normal rate and regular rhythm.     Pulses: Normal pulses.     Heart sounds: Normal heart  sounds. No murmur heard.    No gallop.  Pulmonary:     Effort: Pulmonary effort is normal. No respiratory distress.     Breath sounds: Normal breath sounds. No stridor. No wheezing, rhonchi or rales.  Abdominal:     General: Abdomen is flat. Bowel sounds are normal. There is no distension.     Palpations: Abdomen is soft.     Tenderness: There is no guarding.     Comments: Tenderness to the lower abdomen  Musculoskeletal:        General: Normal range of motion.     Cervical back: Normal range of motion and neck supple.     Right lower leg: No edema.     Left lower leg: No edema.  Skin:    General: Skin is warm and dry.     Findings: No bruising or rash.  Neurological:     General: No focal deficit present.     Mental Status: She is alert and oriented to person, place, and time. Mental status is at baseline.     Cranial Nerves: No cranial nerve deficit.     Sensory: No sensory deficit.     Motor: No weakness.     Coordination: Coordination normal.     Gait: Gait normal.  Psychiatric:        Mood and Affect: Mood normal.        Behavior: Behavior normal.        Thought Content: Thought content normal.        Judgment: Judgment normal.     ED Results / Procedures / Treatments   Labs (all labs ordered are listed, but only abnormal results are displayed) Labs Reviewed  CULTURE, BLOOD (ROUTINE X 2)  CULTURE, BLOOD (ROUTINE X 2)  URINE CULTURE  COMPREHENSIVE METABOLIC PANEL WITH GFR  LACTIC ACID, PLASMA  LACTIC ACID, PLASMA  CBC WITH DIFFERENTIAL/PLATELET  URINALYSIS, ROUTINE W REFLEX MICROSCOPIC    EKG None  Radiology No results found.  Procedures Procedures    Medications Ordered in ED Medications  sodium chloride  0.9 % bolus 1,000 mL (has no administration in time range)    ED Course/ Medical Decision Making/ A&P                                 Medical Decision Making Amount and/or Complexity of Data Reviewed Labs: ordered. Radiology:  ordered.  Risk Prescription drug management.   This patient presents to the ED for concern of dizziness, dysuria differential diagnosis includes urinary tract infection, pyelonephritis, CVA, TIA, labyrinthitis, Mnire's disease, positional vertigo, electrolyte derangement    Additional history obtained:  Additional history obtained from none  External records from outside source obtained and reviewed including none   Lab Tests:  I Ordered, and personally interpreted labs.  The pertinent results include: No leukocytosis, no anemia, normal electrolytes, normal kidney function liver function, negative lactic acid, urinalysis with leukocytes and bacteria   Imaging Studies ordered:  I ordered imaging studies including MRI brain, MRA, CT abdomen pelvis I independently visualized and interpreted imaging which showed no acute ischemic changes, unremarkable MRA, inflammation of the sphenoid sinus, unremarkable CT scan of abdomen and pelvis I agree with the radiologist interpretation   Medicines ordered and prescription drug management:  I ordered medication including meclizine , IV fluids for dizziness and lightheadedness Reevaluation of the patient after these medicines showed that the patient improved I have reviewed the patients home medicines and have made adjustments as needed   Problem List / ED Course:  Patient notes that she does feel much better at this time and is ready for discharge home.  Discussed with patient as all workup in the emergency department has been unremarkable.  Will cover her for sinusitis at this time.  Patient has no indication for CVA or TIA.  Blood work has otherwise been unremarkable.  Will treat her for positional vertigo at this point.  Patient has had no chest pain or shortness of breath and do not SPECT underlying cardiac etiology of her symptoms.  Close follow-up with primary care doctor was discussed as well as strict turn precautions for any new or  worsening symptoms.  Patient voiced understanding to the plan and had no additional questions.  Patient has been ambulatory in the emergency department without difficulty.  Patient was fully evaluated by attending physician who is in agreement to plan at this time.   Social Determinants of Health:  None           Final Clinical Impression(s) / ED Diagnoses Final diagnoses:  None    Rx / DC Orders ED Discharge Orders     None         Roselynn Connors, PA-C 03/10/24 1800    Deatra Face, MD 03/11/24 1423

## 2024-03-10 NOTE — ED Triage Notes (Signed)
 Pt BIB ems for painful urination. Pt states dizziness, confusion today. Pt states some urine retention. Pt states pressure in her groin area. Pt states symptoms started yesterday and has hx of this.

## 2024-03-10 NOTE — Discharge Instructions (Signed)
 Please follow-up closely with your primary care doctor on an outpatient basis.  Return to emergency department immediately for any new or worsening symptoms.

## 2024-03-10 NOTE — ED Notes (Signed)
 Patient ambulated to the restroom and gave a UA sample.

## 2024-03-14 LAB — URINE CULTURE: Culture: 30000 — AB

## 2024-03-15 ENCOUNTER — Telehealth (HOSPITAL_BASED_OUTPATIENT_CLINIC_OR_DEPARTMENT_OTHER): Payer: Self-pay

## 2024-03-15 LAB — CULTURE, BLOOD (ROUTINE X 2)
Culture: NO GROWTH
Culture: NO GROWTH
Special Requests: ADEQUATE

## 2024-03-15 NOTE — Telephone Encounter (Signed)
 Post ED Visit - Positive Culture Follow-up  Culture report reviewed by antimicrobial stewardship pharmacist: [facility] [x]  [place], [place].D. []  Skeet Duke, Pharm.D., BCPS AQ-ID []  Leslee Rase, Pharm.D., BCPS []  Garland Junk, Pharm.D., BCPS []  [place], [address].D., BCPS, AAHIVP []  Alcide Aly, Pharm.D., BCPS, AAHIVP []  Jerri Morale, PharmD, BCPS []  Graham Laws, PharmD, BCPS []  Cleda Curly, PharmD, BCPS []  Tamar Fairly, PharmD []  Ballard Levels, PharmD, BCPS []  Ollen Beverage, PharmD  [facility] []  Arlyne Bering, PharmD []  Sherryle Don, PharmD []  [place], PharmD []  Delila Felty, Rph []  [place]) Cleora Daft, PharmD []  Augustina Block, PharmD []  Arie Kurtz, PharmD []  Sharlyn Deaner, PharmD []  Agnes Hose, PharmD []  Kendall Pauls, PharmD []  Gladstone Lamer, PharmD []  Armanda Bern, PharmD []  Tera Fellows, PharmD   Positive urine culture Treated with Cefdinir , organism sensitive to the same and no further patient follow-up is required at this time.  Delena Feil 03/15/2024, 1:54 PM

## 2024-06-21 ENCOUNTER — Emergency Department (HOSPITAL_COMMUNITY)

## 2024-06-21 ENCOUNTER — Emergency Department (HOSPITAL_COMMUNITY)
Admission: EM | Admit: 2024-06-21 | Discharge: 2024-06-21 | Disposition: A | Discharge status: Home / Self Care | Attending: Emergency Medicine | Admitting: Emergency Medicine

## 2024-06-21 ENCOUNTER — Other Ambulatory Visit: Payer: Self-pay

## 2024-06-21 ENCOUNTER — Encounter (HOSPITAL_COMMUNITY): Payer: Self-pay

## 2024-06-21 DIAGNOSIS — R197 Diarrhea, unspecified: Secondary | ICD-10-CM | POA: Insufficient documentation

## 2024-06-21 DIAGNOSIS — R519 Headache, unspecified: Secondary | ICD-10-CM | POA: Diagnosis not present

## 2024-06-21 DIAGNOSIS — R5383 Other fatigue: Secondary | ICD-10-CM | POA: Insufficient documentation

## 2024-06-21 DIAGNOSIS — Z91010 Allergy to peanuts: Secondary | ICD-10-CM | POA: Insufficient documentation

## 2024-06-21 DIAGNOSIS — R531 Weakness: Secondary | ICD-10-CM | POA: Insufficient documentation

## 2024-06-21 DIAGNOSIS — R112 Nausea with vomiting, unspecified: Secondary | ICD-10-CM | POA: Diagnosis present

## 2024-06-21 DIAGNOSIS — M791 Myalgia, unspecified site: Secondary | ICD-10-CM | POA: Diagnosis not present

## 2024-06-21 DIAGNOSIS — Z79899 Other long term (current) drug therapy: Secondary | ICD-10-CM | POA: Insufficient documentation

## 2024-06-21 LAB — CBC
HCT: 45.1 % (ref 36.0–46.0)
Hemoglobin: 14.8 g/dL (ref 12.0–15.0)
MCH: 29.8 pg (ref 26.0–34.0)
MCHC: 32.8 g/dL (ref 30.0–36.0)
MCV: 90.7 fL (ref 80.0–100.0)
Platelets: 257 K/uL (ref 150–400)
RBC: 4.97 MIL/uL (ref 3.87–5.11)
RDW: 13.2 % (ref 11.5–15.5)
WBC: 5.8 K/uL (ref 4.0–10.5)
nRBC: 0 % (ref 0.0–0.2)

## 2024-06-21 LAB — COMPREHENSIVE METABOLIC PANEL WITH GFR
ALT: 30 U/L (ref 0–44)
AST: 37 U/L (ref 15–41)
Albumin: 3.9 g/dL (ref 3.5–5.0)
Alkaline Phosphatase: 75 U/L (ref 38–126)
Anion gap: 14 (ref 5–15)
BUN: 10 mg/dL (ref 6–20)
CO2: 24 mmol/L (ref 22–32)
Calcium: 9.4 mg/dL (ref 8.9–10.3)
Chloride: 101 mmol/L (ref 98–111)
Creatinine, Ser: 0.74 mg/dL (ref 0.44–1.00)
GFR, Estimated: >60 mL/min (ref >60)
Glucose, Bld: 109 mg/dL — ABNORMAL HIGH (ref 70–99)
Potassium: 3.5 mmol/L (ref 3.5–5.1)
Sodium: 139 mmol/L (ref 135–145)
Total Bilirubin: 1.8 mg/dL — ABNORMAL HIGH (ref 0.0–1.2)
Total Protein: 7.1 g/dL (ref 6.5–8.1)

## 2024-06-21 LAB — URINALYSIS, ROUTINE W REFLEX MICROSCOPIC
Bilirubin Urine: NEGATIVE
Glucose, UA: NEGATIVE mg/dL
Hgb urine dipstick: NEGATIVE
Ketones, ur: NEGATIVE mg/dL
Leukocytes,Ua: NEGATIVE
Nitrite: NEGATIVE
Protein, ur: NEGATIVE mg/dL
Specific Gravity, Urine: 1.021 (ref 1.005–1.030)
pH: 6 (ref 5.0–8.0)

## 2024-06-21 LAB — LIPASE, BLOOD: Lipase: 36 U/L (ref 11–51)

## 2024-06-21 MED ORDER — ONDANSETRON 4 MG PO TBDP: 4.0000 mg | ORAL_TABLET | Freq: Three times a day (TID) | ORAL | 0 refills | Status: DC | PRN

## 2024-06-21 MED ORDER — IOHEXOL 300 MG/ML  SOLN
100.0000 mL | Freq: Once | INTRAMUSCULAR | Status: AC | PRN
  Administered 2024-06-21: 100 mL via INTRAVENOUS

## 2024-06-21 NOTE — Discharge Instructions (Addendum)
 You were seen in the Emergency department for nausea vomiting diarrhea and general weakness.  You had lab work and a CAT scan that did not show an obvious explanation for your symptoms.  Please follow-up with your primary care doctor and gastroenterology.  We are prescribing you some nausea medication.  Please return to the emergency department if any worsening or concerning symptoms.

## 2024-06-21 NOTE — ED Notes (Signed)
 Patient transported to CT

## 2024-06-21 NOTE — ED Triage Notes (Signed)
 Pt c/o N/V/D mainly diarrhea for a couple of months. Pt saw PCP and was prescribed Macrobid d/t elevated WBC and several bacteria detected in urine. Pt has been on 3 doses with no improvement. Pt has not been referred to GI yet. Pt states only abdominal pain when using the bathroom.

## 2024-06-21 NOTE — ED Provider Notes (Signed)
 [name] EMERGENCY DEPARTMENT AT [facility] Provider Note   CSN: [id number] Arrival date & time: 06/21/24  9065     Patient presents with: Diarrhea   Alexandra Stein is a 58 y.o. female.  She is here with multiple complaints.  She said about 3 months ago she started getting body aches and nausea vomiting diarrhea.  Poor appetite.  General fatigue and feeling weak.  Symptoms may have started after she stopped her psychiatric medications.  She feels her mood has been off and she is swearing more.  Had a possible recent urinary tract infection was put on Macrobid.  Today at work she said she was so weak that she could not stand.  No fevers or chills.  No chest pain or shortness of breath.  No focal numbness or weakness.   The history is provided by the patient.  Weakness Severity:  Severe Onset quality:  Gradual Timing:  Constant Progression:  Worsening Associated symptoms: diarrhea, headaches, myalgias, nausea and vomiting   Associated symptoms: no abdominal pain, no chest pain, no cough, no fever and no shortness of breath        Prior to Admission medications   Medication Sig Start Date End Date Taking? Authorizing Provider  acetaminophen  (TYLENOL ) 500 MG tablet Take 1,000 mg by mouth every 6 (six) hours as needed for moderate pain.    [provider]  albuterol  (VENTOLIN  HFA) 108 (90 Base) MCG/ACT inhaler Inhale 2 puffs into the lungs every 6 (six) hours as needed. 01/12/23   Desai, Nikita S, MD  amphetamine-dextroamphetamine (ADDERALL XR) 20 MG 24 hr capsule Take 20 mg by mouth every morning. 12/26/22   [provider]  ARIPiprazole  (ABILIFY ) 15 MG tablet Take 15 mg by mouth at bedtime. 08/29/19   [provider]  benzonatate  (TESSALON ) 100 MG capsule Take 1 capsule (100 mg total) by mouth every 8 (eight) hours. 09/26/23   Horton, Charmaine FALCON, MD  DULoxetine  (CYMBALTA ) 60 MG capsule Take 60 mg by mouth at bedtime.     [provider]   fluticasone  (FLONASE ) 50 MCG/ACT nasal spray Place 1 spray into both nostrils daily. Patient taking differently: Place 1 spray into both nostrils daily. PRN 01/12/23   Meade Verdon RAMAN, MD  furosemide  (LASIX ) 40 MG tablet Take 40 mg by mouth every morning. 08/29/19   [provider]  meclizine  (ANTIVERT ) 25 MG tablet Take 1 tablet (25 mg total) by mouth 3 (three) times daily as needed for dizziness. 03/10/24   Daralene Lonni BIRCH, PA-C  methocarbamol  (ROBAXIN ) 750 MG tablet Take 1 tablet (750 mg total) by mouth 3 (three) times daily as needed (muscle spasm/pain). 06/05/20   Steinl, Kevin, MD  montelukast  (SINGULAIR ) 10 MG tablet TAKE 1 TABLET BY MOUTH EVERYDAY AT BEDTIME 12/20/23   Desai, Nikita S, MD  naproxen  (NAPROSYN ) 500 MG tablet Take 500 mg by mouth 2 (two) times daily as needed. 11/01/19   [provider]  OLANZapine  (ZYPREXA ) 10 MG tablet Take 10 mg by mouth at bedtime. 08/29/19   [provider]  topiramate  (TOPAMAX ) 100 MG tablet Take 100 mg by mouth at bedtime.     [provider]  traMADol  (ULTRAM ) 50 MG tablet Take 50 mg by mouth every 12 (twelve) hours as needed. 12/15/21   [provider]  omeprazole (PRILOSEC) 20 MG capsule Take 20 mg by mouth every morning. 07/18/19 03/31/20  [provider]    Allergies: Codeine and Peanut allergen powder-dnfp  Review of Systems  Constitutional:  Positive for fatigue. Negative for fever.  Eyes:  Negative for visual disturbance.  Respiratory:  Negative for cough and shortness of breath.   Cardiovascular:  Negative for chest pain.  Gastrointestinal:  Positive for diarrhea, nausea and vomiting. Negative for abdominal pain.  Musculoskeletal:  Positive for myalgias.  Neurological:  Positive for weakness and headaches.    Updated Vital Signs BP (!) 144/74 (BP Location: Right Arm)   Pulse 79   Temp 98.2 F (36.8 C) (Oral)   Resp 16   Ht 5' 2 (1.575 m)   Wt 109.8 kg   LMP 08/18/2012   SpO2  100%   BMI 44.26 kg/m   Physical Exam Vitals and nursing note reviewed.  Constitutional:      General: She is not in acute distress.    Appearance: Normal appearance. She is well-developed.  HENT:     Head: Normocephalic and atraumatic.  Eyes:     Conjunctiva/sclera: Conjunctivae normal.  Cardiovascular:     Rate and Rhythm: Normal rate and regular rhythm.     Heart sounds: No murmur heard. Pulmonary:     Effort: Pulmonary effort is normal. No respiratory distress.     Breath sounds: Normal breath sounds. No stridor. No wheezing.  Abdominal:     Palpations: Abdomen is soft.     Tenderness: There is no abdominal tenderness. There is no guarding or rebound.  Musculoskeletal:        General: No tenderness or deformity. Normal range of motion.     Cervical back: Neck supple.  Skin:    General: Skin is warm and dry.  Neurological:     General: No focal deficit present.     Mental Status: She is alert and oriented to person, place, and time.     GCS: GCS eye subscore is 4. GCS verbal subscore is 5. GCS motor subscore is 6.     Cranial Nerves: No cranial nerve deficit.     Sensory: No sensory deficit.     Motor: No weakness.     (all labs ordered are listed, but only abnormal results are displayed) Labs Reviewed  COMPREHENSIVE METABOLIC PANEL WITH GFR - Abnormal; Notable for the following components:      Result Value   Glucose, Bld 109 (*)    Total Bilirubin 1.8 (*)    All other components within normal limits  URINALYSIS, ROUTINE W REFLEX MICROSCOPIC - Abnormal; Notable for the following components:   APPearance CLOUDY (*)    All other components within normal limits  LIPASE, BLOOD  CBC    EKG: None  Radiology: CT Head Wo Contrast Result Date: 06/21/2024 EXAM: CT HEAD WITHOUT CONTRAST 06/21/2024 12:08:40 PM TECHNIQUE: CT of the head was performed without the administration of intravenous contrast. Automated exposure control, iterative reconstruction, and/or weight  based adjustment of the mA/kV was utilized to reduce the radiation dose to as low as reasonably achievable. COMPARISON: 08/07/2023. CLINICAL HISTORY: Mental status change, unknown cause. Pt c/o N/V/D mainly diarrhea for a couple of months. Pt states only abdominal pain when using the bathroom. FINDINGS: BRAIN AND VENTRICLES: No acute hemorrhage. Gray-white differentiation is preserved. No hydrocephalus. No extra-axial collection. No mass effect or midline shift. ORBITS: No acute abnormality. SINUSES: Mild mucosal disease within the sphenoid sinus. SOFT TISSUES AND SKULL: No acute soft tissue abnormality. No skull fracture. IMPRESSION: 1. No acute intracranial abnormality. 2. Mild mucosal disease within the sphenoid sinus. Electronically signed by: evalene coho 06/21/2024  12:38 PM EDT RP Workstation: [id number]   CT ABDOMEN PELVIS W CONTRAST Result Date: 06/21/2024 CLINICAL DATA:  Several month history of nausea, vomiting, and diarrhea EXAM: CT ABDOMEN AND PELVIS WITH CONTRAST TECHNIQUE: Multidetector CT imaging of the abdomen and pelvis was performed using the standard protocol following bolus administration of intravenous contrast. RADIATION DOSE REDUCTION: This exam was performed according to the departmental dose-optimization program which includes automated exposure control, adjustment of the mA and/or kV according to patient size and/or use of iterative reconstruction technique. CONTRAST:  OMNIPAQUE  IOHEXOL  300 MG/ML  SOLN COMPARISON:  CT abdomen and pelvis dated 03/10/2024 FINDINGS: Lower chest: No focal consolidation or pulmonary nodule in the lung bases. No pleural effusion or pneumothorax demonstrated. Partially imaged heart size is normal. Hepatobiliary: No focal hepatic lesions. No intra or extrahepatic biliary ductal dilation. Cholecystectomy. Pancreas: No focal lesions or main ductal dilation. Spleen: Normal in size without focal abnormality. Adrenals/Urinary Tract: No adrenal nodules. No  suspicious renal mass, calculi or hydronephrosis. No focal bladder wall thickening. Stomach/Bowel: Normal appearance of the stomach. Single loop of mildly dilated small bowel in the left upper quadrant without abrupt caliber transition. Colon is diffusely underdistended. Colonic diverticulosis without acute diverticulitis. Normal appendix. Vascular/Lymphatic: Aortic atherosclerosis. No enlarged abdominal or pelvic lymph nodes. Reproductive: No adnexal masses. Other: No free fluid, fluid collection, or free air. Musculoskeletal: No acute or abnormal lytic or blastic osseous lesions. 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 IMPRESSION: 1. Single loop of mildly dilated small bowel in the left upper quadrant without abrupt caliber change, which may represent a focal ileus. 2. 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 3. Colon is diffusely underdistended. Colonic diverticulosis without acute diverticulitis. 4.  Aortic Atherosclerosis (ICD10-I70.0). Electronically Signed   By: Limin  Xu M.D.   On: 06/21/2024 12:34     Procedures   Medications Ordered in the ED  iohexol  (OMNIPAQUE ) 300 MG/ML solution 100 mL (100 mLs Intravenous Contrast Given 06/21/24 1158)    Clinical Course as of 06/21/24 1713  Fri Jun 21, 2024  1316 Patient's workup is fairly unremarkable.  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  Reviewed with patient.  Will put a referral in for her to follow-up with GI,  [MB]    Clinical Course User Index [MB] Towana Ozell BROCKS, MD                                 Medical Decision Making Amount and/or Complexity of Data Reviewed Labs: ordered. Radiology: ordered.  Risk Prescription drug management.   This patient complains of nausea vomiting diarrhea, feeling weak, behavior change; this involves an extensive number of treatment Options and is a complaint that carries with it a high  risk of complications and morbidity. The differential includes mental health issue, dehydration, obstruction, colitis, diverticulitis  I ordered, reviewed and interpreted labs, which included CBC normal, chemistries and LFTs unremarkable, urinalysis without signs of infection I ordered imaging studies which included head CT and CT abdomen and I independently    visualized and interpreted imaging which showed isolated fluid loop question ileus, question nonobstructed Richter's hernia Additional history obtained from patient's husband Previous records obtained and reviewed in epic including recent PCP notes Cardiac monitoring reviewed, normal sinus rhythm Social determinants considered, no significant barriers Critical Interventions: None  After the interventions stated above,  I reevaluated the patient and found awake and alert with benign abdominal exam Admission and further testing considered, no indications for admission.  Have placed a referral into GI and recommended patient discuss with her PCP regarding restarting her psychiatric medications.  Return instructions discussed      Final diagnoses:  Nausea vomiting and diarrhea  General weakness    ED Discharge Orders          Ordered    ondansetron  (ZOFRAN -ODT) 4 MG disintegrating tablet  Every 8 hours PRN        06/21/24 1321    Ambulatory referral to Gastroenterology        06/21/24 1339               Towana Ozell BROCKS, MD 06/21/24 1715

## 2024-07-04 ENCOUNTER — Ambulatory Visit (INDEPENDENT_AMBULATORY_CARE_PROVIDER_SITE_OTHER): Admitting: Gastroenterology

## 2024-07-31 ENCOUNTER — Other Ambulatory Visit: Payer: Self-pay | Admitting: Family Medicine

## 2024-07-31 DIAGNOSIS — M7989 Other specified soft tissue disorders: Secondary | ICD-10-CM

## 2024-08-02 ENCOUNTER — Ambulatory Visit (HOSPITAL_COMMUNITY)
Admission: RE | Admit: 2024-08-02 | Discharge: 2024-08-02 | Disposition: A | Discharge status: Home / Self Care | Source: Ambulatory Visit | Attending: Vascular Surgery | Admitting: Vascular Surgery

## 2024-08-02 ENCOUNTER — Other Ambulatory Visit (HOSPITAL_COMMUNITY): Payer: Self-pay | Admitting: Family Medicine

## 2024-08-02 DIAGNOSIS — M79603 Pain in arm, unspecified: Secondary | ICD-10-CM | POA: Diagnosis present

## 2024-08-02 DIAGNOSIS — M79606 Pain in leg, unspecified: Secondary | ICD-10-CM | POA: Diagnosis present

## 2024-08-05 ENCOUNTER — Other Ambulatory Visit

## 2024-08-05 ENCOUNTER — Telehealth (INDEPENDENT_AMBULATORY_CARE_PROVIDER_SITE_OTHER): Admitting: Adult Health

## 2024-08-05 ENCOUNTER — Encounter: Payer: Self-pay | Admitting: Adult Health

## 2024-08-05 VITALS — Ht 62.0 in | Wt 245.0 lb

## 2024-08-05 DIAGNOSIS — F902 Attention-deficit hyperactivity disorder, combined type: Secondary | ICD-10-CM

## 2024-08-05 DIAGNOSIS — F3181 Bipolar II disorder: Secondary | ICD-10-CM

## 2024-08-05 DIAGNOSIS — F411 Generalized anxiety disorder: Secondary | ICD-10-CM

## 2024-08-05 MED ORDER — AMPHETAMINE-DEXTROAMPHET ER 20 MG PO CP24: 20.0000 mg | ORAL_CAPSULE | Freq: Every morning | ORAL | 0 refills | Status: AC

## 2024-08-05 MED ORDER — AMPHETAMINE-DEXTROAMPHET ER 20 MG PO CP24: 20.0000 mg | ORAL_CAPSULE | Freq: Every day | ORAL | 0 refills | Status: AC

## 2024-08-05 NOTE — Progress Notes (Signed)
 [facility] MD/PA/NP Initial Note  Virtual Visit via Video Note  I connected with pt @ on 08/05/24 at  5:00 PM EDT by a video enabled telemedicine application and verified that I am speaking with the correct person using two identifiers.   I discussed the limitations of evaluation and management by telemedicine and the availability of in person appointments. The patient expressed understanding and agreed to proceed.  I discussed the assessment and treatment plan with the patient. The patient was provided an opportunity to ask questions and all were answered. The patient agreed with the plan and demonstrated an understanding of the instructions.   The patient was advised to call back or seek an in-person evaluation if the symptoms worsen or if the condition fails to improve as anticipated.  I provided 60 minutes of non-face-to-face time during this encounter.  The patient was located at home.  The provider was located at [facility].   Angeline LOISE Sayers, NP  08/05/2024 5:42 PM KEVYN WENGERT  MRN:  [id number]  Chief Complaint:   HPI:   Patient seen today for initial psychiatric evaluation.   Diagnosed with Bipolar 2 disorder in 2002 - in the fall - in the bed and could not function - thought she had come down with something - husband concerned. Went to her PCP - testing and evaluations determined it was BPD-2.  Most recently reports stopping medications in May (end of the school year) did not do well without them and restarted them in August.   Describes mood today as ok. Pleasant. Denies tearfulness. Mood symptoms - reports some lingering depression - getting better. Reports stable interest and motivation. Denies anxiety. Denies panic attacks. Denies irritability. Report some worry and over thinking. Denies rumination Denies obsessive thoughts or acts. Denies mood instability. Reports mood today as stable. Stating I feel like I'm doing ok. Taking medications as  prescribed.  Energy levels stable. Active, has a regular exercise routine - 5 days a week.   Enjoys some usual interests and activities. Married x 35 years. Lives with husband and 2 dogs. Has 3 grown sons and 7 grandchildren. Spending time with family. Appetite adequate - getting better - eating healthier. Weight loss - 65 pounds - 245 pounds. Sleeps well most nights. Averages 6 hours. Focus and concentration stable. Completing tasks. Managing aspects of household. 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 Denies SI or HI.  Denies AH or VH. Denies self harm. Denies substance use. Denies alcohol use.  Previous medication trials:   Denies   Visit Diagnosis:    ICD-10-CM   1. Bipolar II disorder (HCC)  F31.81     2. Attention deficit hyperactivity disorder (ADHD), combined type  F90.2     3. Generalized anxiety disorder  F41.1 amphetamine-dextroamphetamine (ADDERALL XR) 20 MG 24 hr capsule    amphetamine-dextroamphetamine (ADDERALL XR) 20 MG 24 hr capsule    amphetamine-dextroamphetamine (ADDERALL XR) 20 MG 24 hr capsule      Past Psychiatric History: Denies psychiatric hospitalization.   Past Medical History:  Past Medical History:  Diagnosis Date   Bipolar 2 disorder (HCC)    Chest pain    GERD (gastroesophageal reflux disease)    H/O chest pain    dx'd with GERD   History of syncope 11/07/2000   Vasovagal   Hyperlipidemia    Migraines    Potassium (K) deficiency    per pt K+ deficiency - ? from meds she takes ( none of which  are diuretucs)   SOB (shortness of breath)    SVD (spontaneous vaginal delivery)    x 2   Syncope and collapse     Past Surgical History:  Procedure Laterality Date   CESAREAN SECTION     x 1   CHOLECYSTECTOMY  07/27/2012   Procedure: LAPAROSCOPIC CHOLECYSTECTOMY;  Surgeon: Vicenta DELENA Poli, MD;  Location: [facility];  Service: General;  Laterality: N/A;   TUBAL LIGATION     x 2   WISDOM TOOTH EXTRACTION      Family Psychiatric History:  Denies any family history of mental illness.   Family History:  Family History  Problem Relation Age of Onset   Dilated cardiomyopathy Mother        with ICD   Heart disease Mother    Atrial fibrillation Mother    Diabetes type II Father    Cancer Father    Kidney failure Father    Healthy Brother    Dementia Maternal Grandmother    Cancer Maternal Grandmother    Stroke Maternal Grandmother    Lung cancer Maternal Grandfather        black lung   Cancer Maternal Grandfather    Healthy Child        x3   Breast cancer Other     Social History:  Social History   Socioeconomic History   Marital status: Married    Spouse name: Not on file   Number of children: 3   Years of education: 14   Highest education level: Not on file  Occupational History   Occupation: Magazine features editor: [employer]  Tobacco Use   Smoking status: Never   Smokeless tobacco: Never  Vaping Use   Vaping status: Never Used  Substance and Sexual Activity   Alcohol use: No   Drug use: No   Sexual activity: Yes    Birth control/protection: Surgical  Other Topics Concern   Not on file  Social History Narrative   Lives in [place], [place] with husband.    Right handed   Two story home   Drinks caffeine   Social Drivers of Corporate investment banker Strain: Not on file  Food Insecurity: Not on file  Transportation Needs: Not on file  Physical Activity: Not on file  Stress: Not on file  Social Connections: Not on file    Allergies:  Allergies  Allergen Reactions   Codeine Other (See Comments)    Hallucinations and fever. Pt CAN take Vicodin and Percocet.  Other Reaction(s): Not available    Metabolic Disorder Labs: Lab Results  Component Value Date   HGBA1C 5.5 01/18/2016   MPG 111 01/18/2016   MPG 114 12/12/2011   No results found for: PROLACTIN Lab Results  Component Value Date   CHOL 169 01/18/2016   TRIG 91 01/18/2016   HDL 55 01/18/2016   CHOLHDL 3.1  01/18/2016   VLDL 18 01/18/2016   LDLCALC 96 01/18/2016   LDLCALC 109 (H) 12/12/2011   Lab Results  Component Value Date   TSH 2.26 03/03/2016   TSH 5.173 (H) 01/18/2016    Therapeutic Level Labs: No results found for: LITHIUM No results found for: VALPROATE No results found for: CBMZ  Current Medications: Current Outpatient Medications  Medication Sig Dispense Refill   [START ON 09/02/2024] amphetamine-dextroamphetamine (ADDERALL XR) 20 MG 24 hr capsule Take 1 capsule (20 mg total) by mouth daily. 30 capsule 0   [START ON 09/30/2024] amphetamine-dextroamphetamine (ADDERALL XR)  20 MG 24 hr capsule Take 1 capsule (20 mg total) by mouth daily. 30 capsule 0   acetaminophen  (TYLENOL ) 500 MG tablet Take 1,000 mg by mouth every 6 (six) hours as needed for moderate pain.     albuterol  (VENTOLIN  HFA) 108 (90 Base) MCG/ACT inhaler Inhale 2 puffs into the lungs every 6 (six) hours as needed. 18 g 5   amphetamine-dextroamphetamine (ADDERALL XR) 20 MG 24 hr capsule Take 1 capsule (20 mg total) by mouth every morning. 30 capsule 0   ARIPiprazole  (ABILIFY ) 15 MG tablet Take 15 mg by mouth at bedtime.     benzonatate  (TESSALON ) 100 MG capsule Take 1 capsule (100 mg total) by mouth every 8 (eight) hours. 21 capsule 0   DULoxetine  (CYMBALTA ) 60 MG capsule Take 60 mg by mouth at bedtime.      fluticasone  (FLONASE ) 50 MCG/ACT nasal spray Place 1 spray into both nostrils daily. (Patient taking differently: Place 1 spray into both nostrils daily. PRN) 16 g 2   furosemide  (LASIX ) 40 MG tablet Take 40 mg by mouth every morning.     meclizine  (ANTIVERT ) 25 MG tablet Take 1 tablet (25 mg total) by mouth 3 (three) times daily as needed for dizziness. 21 tablet 0   methocarbamol  (ROBAXIN ) 750 MG tablet Take 1 tablet (750 mg total) by mouth 3 (three) times daily as needed (muscle spasm/pain). 15 tablet 0   montelukast  (SINGULAIR ) 10 MG tablet TAKE 1 TABLET BY MOUTH EVERYDAY AT BEDTIME 90 tablet 3    naproxen  (NAPROSYN ) 500 MG tablet Take 500 mg by mouth 2 (two) times daily as needed.     OLANZapine  (ZYPREXA ) 10 MG tablet Take 10 mg by mouth at bedtime.     ondansetron  (ZOFRAN -ODT) 4 MG disintegrating tablet Take 1 tablet (4 mg total) by mouth every 8 (eight) hours as needed for nausea or vomiting. 20 tablet 0   topiramate  (TOPAMAX ) 100 MG tablet Take 100 mg by mouth at bedtime.      traMADol  (ULTRAM ) 50 MG tablet Take 50 mg by mouth every 12 (twelve) hours as needed.     No current facility-administered medications for this visit.    Medication Side Effects: none  Orders placed this visit:  No orders of the defined types were placed in this encounter.   Psychiatric Specialty Exam:  Review of Systems  Musculoskeletal:  Negative for gait problem.  Neurological:  Negative for tremors.  Psychiatric/Behavioral:         Please refer to HPI    Height 5' 2 (1.575 m), weight 245 lb (111.1 kg), last menstrual period 08/18/2012.Body mass index is 44.81 kg/m.  General Appearance: Casual and Neat  Eye Contact:  Good  Speech:  Clear and Coherent and Normal Rate  Volume:  Normal  Mood:  Euthymic  Affect:  Appropriate and Congruent  Thought Process:  Coherent and Descriptions of Associations: Intact  Orientation:  Full (Time, Place, and Person)  Thought Content: Logical   Suicidal Thoughts:  No  Homicidal Thoughts:  No  Memory:  WNL  Judgement:  Good  Insight:  Good  Psychomotor Activity:  Normal  Concentration:  Concentration: Good and Attention Span: Good  Recall:  Good  Fund of Knowledge: Good  Language: Good  Assets:  Communication Skills Desire for Improvement Financial Resources/Insurance Housing Intimacy Leisure Time Physical Health Resilience Social Support Talents/Skills Transportation Vocational/Educational  ADL's:  Intact  Cognition: WNL  Prognosis:  Good   Screenings:  Flowsheet Row ED from 06/21/2024 in  [place] [facility] at [facility] ED from 03/10/2024 in [facility] at [facility] ED from 09/26/2023 in [facility] Emergency Department at [facility]  C-SSRS RISK CATEGORY No Risk No Risk No Risk    Receiving Psychotherapy: No   Treatment Plan/Recommendations:   Plan:  PDMP reviewed  BP - 120/70  Will call for refills between appointments: currently has 3 refills, but 4 month appt. Abilify  15mg  daily Olanzapine  10mg  at hs Cymbalta  60mg    Topamax  100mg   Adderall XR 20mg  every morning - diagnosed with ADD 18 years ago and has continued Adderall.  RTC 4 months  Discussed potential benefits, risks, and side effects of stimulants with patient to include increased heart rate, palpitations, insomnia, increased anxiety, increased irritability, or decreased appetite.  Instructed patient to contact office if experiencing any significant tolerability issues.   Discussed potential metabolic side effects associated with atypical antipsychotics, as well as potential risk for movement side effects. Advised pt to contact office if movement side effects occur.    Patient advised to contact office with any questions, adverse effects, or acute worsening in signs and symptoms.    45 minutes spent dedicated to the care of this patient on the date of this encounter to include pre-visit review of records, ordering of medication, post visit documentation, and face-to-face time with the patient discussing depression, anxiety, insomnia and obsessional thoughts. Discussed continuing current medication regimen.  Aria Jarrard N Jakai Risse, NP

## 2024-08-05 NOTE — Progress Notes (Deleted)
 Alexandra Stein [id number] May 13, 1966 58 y.o.  Virtual Visit via Video Note  I connected with pt @ on 08/05/24 at  5:00 PM EDT by a video enabled telemedicine application and verified that I am speaking with the correct person using two identifiers.   I discussed the limitations of evaluation and management by telemedicine and the availability of in person appointments. The patient expressed understanding and agreed to proceed.  I discussed the assessment and treatment plan with the patient. The patient was provided an opportunity to ask questions and all were answered. The patient agreed with the plan and demonstrated an understanding of the instructions.   The patient was advised to call back or seek an in-person evaluation if the symptoms worsen or if the condition fails to improve as anticipated.  I provided *** minutes of non-face-to-face time during this encounter.  The patient was located at home.  The provider was located at [facility].   Angeline LOISE Sayers, NP   Subjective:   Patient ID:  Alexandra Stein is a 58 y.o. (DOB [birth year]) female.  Chief Complaint: No chief complaint on file.   HPI [place] presents for follow-up of ***   Review of Systems:  Review of Systems  Medications: {medication reviewed/display:3041432}  Current Outpatient Medications  Medication Sig Dispense Refill   acetaminophen  (TYLENOL ) 500 MG tablet Take 1,000 mg by mouth every 6 (six) hours as needed for moderate pain.     albuterol  (VENTOLIN  HFA) 108 (90 Base) MCG/ACT inhaler Inhale 2 puffs into the lungs every 6 (six) hours as needed. 18 g 5   amphetamine-dextroamphetamine (ADDERALL XR) 20 MG 24 hr capsule Take 20 mg by mouth every morning.     ARIPiprazole  (ABILIFY ) 15 MG tablet Take 15 mg by mouth at bedtime.     benzonatate  (TESSALON ) 100 MG capsule Take 1 capsule (100 mg total) by mouth every 8 (eight) hours. 21 capsule 0   DULoxetine  (CYMBALTA ) 60 MG capsule Take  60 mg by mouth at bedtime.      fluticasone  (FLONASE ) 50 MCG/ACT nasal spray Place 1 spray into both nostrils daily. (Patient taking differently: Place 1 spray into both nostrils daily. PRN) 16 g 2   furosemide  (LASIX ) 40 MG tablet Take 40 mg by mouth every morning.     meclizine  (ANTIVERT ) 25 MG tablet Take 1 tablet (25 mg total) by mouth 3 (three) times daily as needed for dizziness. 21 tablet 0   methocarbamol  (ROBAXIN ) 750 MG tablet Take 1 tablet (750 mg total) by mouth 3 (three) times daily as needed (muscle spasm/pain). 15 tablet 0   montelukast  (SINGULAIR ) 10 MG tablet TAKE 1 TABLET BY MOUTH EVERYDAY AT BEDTIME 90 tablet 3   naproxen  (NAPROSYN ) 500 MG tablet Take 500 mg by mouth 2 (two) times daily as needed.     OLANZapine  (ZYPREXA ) 10 MG tablet Take 10 mg by mouth at bedtime.     ondansetron  (ZOFRAN -ODT) 4 MG disintegrating tablet Take 1 tablet (4 mg total) by mouth every 8 (eight) hours as needed for nausea or vomiting. 20 tablet 0   topiramate  (TOPAMAX ) 100 MG tablet Take 100 mg by mouth at bedtime.      traMADol  (ULTRAM ) 50 MG tablet Take 50 mg by mouth every 12 (twelve) hours as needed.     No current facility-administered medications for this visit.    Medication Side Effects: {Medication Side Effects (Optional):21014029}  Allergies:  Allergies  Allergen Reactions   Codeine Other (See Comments)  Hallucinations and fever. Pt CAN take Vicodin and Percocet.  Other Reaction(s): Not available   Peanut Allergen Powder-Dnfp Swelling    Pt allergic to peanuts    Past Medical History:  Diagnosis Date   Bipolar 2 disorder (HCC)    Chest pain    GERD (gastroesophageal reflux disease)    H/O chest pain    dx'd with GERD   History of syncope 11/07/2000   Vasovagal   Hyperlipidemia    Migraines    Potassium (K) deficiency    per pt K+ deficiency - ? from meds she takes ( none of which are diuretucs)   SOB (shortness of breath)    SVD (spontaneous vaginal delivery)    x 2    Syncope and collapse     Family History  Problem Relation Age of Onset   Dilated cardiomyopathy Mother        with ICD   Heart disease Mother    Atrial fibrillation Mother    Diabetes type II Father    Cancer Father    Kidney failure Father    Healthy Brother    Dementia Maternal Grandmother    Cancer Maternal Grandmother    Stroke Maternal Grandmother    Lung cancer Maternal Grandfather        black lung   Cancer Maternal Grandfather    Healthy Child        x3   Breast cancer Other     Social History   Socioeconomic History   Marital status: Married    Spouse name: Not on file   Number of children: 3   Years of education: 14   Highest education level: Not on file  Occupational History   Occupation: Magazine features editor: [employer]  Tobacco Use   Smoking status: Never   Smokeless tobacco: Never  Vaping Use   Vaping status: Never Used  Substance and Sexual Activity   Alcohol use: No   Drug use: No   Sexual activity: Yes    Birth control/protection: Surgical  Other Topics Concern   Not on file  Social History Narrative   Lives in [place], [place] with husband.    Right handed   Two story home   Drinks caffeine   Social Drivers of Corporate investment banker Strain: Not on file  Food Insecurity: Not on file  Transportation Needs: Not on file  Physical Activity: Not on file  Stress: Not on file  Social Connections: Not on file  Intimate Partner Violence: Not on file    Past Medical History, Surgical history, Social history, and Family history were reviewed and updated as appropriate.   Please see review of systems for further details on the patient's review from today.   Objective:   Physical Exam:  LMP 08/18/2012   Physical Exam  Lab Review:     Component Value Date/Time   NA 139 06/21/2024 1016   K 3.5 06/21/2024 1016   CL 101 06/21/2024 1016   CO2 24 06/21/2024 1016   GLUCOSE 109 (H) 06/21/2024 1016   BUN 10 06/21/2024  1016   CREATININE 0.74 06/21/2024 1016   CALCIUM  9.4 06/21/2024 1016   PROT 7.1 06/21/2024 1016   ALBUMIN 3.9 06/21/2024 1016   AST 37 06/21/2024 1016   ALT 30 06/21/2024 1016   ALKPHOS 75 06/21/2024 1016   BILITOT 1.8 (H) 06/21/2024 1016   GFRNONAA >60 06/21/2024 1016   GFRAA >60 07/26/2020 1445  Component Value Date/Time   WBC 5.8 06/21/2024 1016   RBC 4.97 06/21/2024 1016   HGB 14.8 06/21/2024 1016   HCT 45.1 06/21/2024 1016   PLT 257 06/21/2024 1016   MCV 90.7 06/21/2024 1016   MCH 29.8 06/21/2024 1016   MCHC 32.8 06/21/2024 1016   RDW 13.2 06/21/2024 1016   LYMPHSABS 2.2 03/10/2024 1615   MONOABS 0.6 03/10/2024 1615   EOSABS 0.1 03/10/2024 1615   BASOSABS 0.0 03/10/2024 1615    No results found for: POCLITH, LITHIUM   No results found for: PHENYTOIN, PHENOBARB, VALPROATE, CBMZ   .res Assessment: Plan:    There are no diagnoses linked to this encounter.   Please see After Visit Summary for patient specific instructions.  Future Appointments  Date Time Provider Department Center  08/05/2024  5:00 PM Ramses Klecka Nattalie, NP [facility] None    No orders of the defined types were placed in this encounter.     -------------------------------

## 2024-08-06 ENCOUNTER — Ambulatory Visit
Admission: RE | Admit: 2024-08-06 | Discharge: 2024-08-06 | Disposition: A | Discharge status: Home / Self Care | Source: Ambulatory Visit | Attending: Family Medicine | Admitting: Family Medicine

## 2024-08-06 DIAGNOSIS — M7989 Other specified soft tissue disorders: Secondary | ICD-10-CM

## 2024-08-09 ENCOUNTER — Other Ambulatory Visit: Payer: Self-pay | Admitting: Physician Assistant

## 2024-08-09 DIAGNOSIS — R2241 Localized swelling, mass and lump, right lower limb: Secondary | ICD-10-CM

## 2024-08-21 ENCOUNTER — Ambulatory Visit
Admission: RE | Admit: 2024-08-21 | Discharge: 2024-08-21 | Disposition: A | Discharge status: Home / Self Care | Source: Ambulatory Visit | Attending: Physician Assistant | Admitting: Physician Assistant

## 2024-08-21 DIAGNOSIS — R2241 Localized swelling, mass and lump, right lower limb: Secondary | ICD-10-CM

## 2024-08-21 MED ORDER — GADOPICLENOL 0.5 MMOL/ML IV SOLN
10.0000 mL | Freq: Once | INTRAVENOUS | Status: AC | PRN
Start: 2024-08-21 — End: 2024-08-21
  Administered 2024-08-21: 10 mL via INTRAVENOUS

## 2024-09-10 ENCOUNTER — Encounter (HOSPITAL_COMMUNITY): Payer: Self-pay | Admitting: Emergency Medicine

## 2024-09-10 ENCOUNTER — Emergency Department (HOSPITAL_COMMUNITY)
Admission: EM | Admit: 2024-09-10 | Discharge: 2024-09-10 | Disposition: A | Discharge status: Home / Self Care | Attending: Emergency Medicine | Admitting: Emergency Medicine

## 2024-09-10 ENCOUNTER — Other Ambulatory Visit: Payer: Self-pay

## 2024-09-10 DIAGNOSIS — R110 Nausea: Secondary | ICD-10-CM | POA: Insufficient documentation

## 2024-09-10 DIAGNOSIS — R42 Dizziness and giddiness: Secondary | ICD-10-CM | POA: Insufficient documentation

## 2024-09-10 DIAGNOSIS — R519 Headache, unspecified: Secondary | ICD-10-CM | POA: Diagnosis present

## 2024-09-10 LAB — COMPREHENSIVE METABOLIC PANEL WITH GFR
ALT: 17 U/L (ref 0–44)
AST: 20 U/L (ref 15–41)
Albumin: 4.2 g/dL (ref 3.5–5.0)
Alkaline Phosphatase: 114 U/L (ref 38–126)
Anion gap: 10 (ref 5–15)
BUN: 14 mg/dL (ref 6–20)
CO2: 28 mmol/L (ref 22–32)
Calcium: 9.7 mg/dL (ref 8.9–10.3)
Chloride: 104 mmol/L (ref 98–111)
Creatinine, Ser: 0.77 mg/dL (ref 0.44–1.00)
GFR, Estimated: >60 mL/min (ref >60)
Glucose, Bld: 102 mg/dL — ABNORMAL HIGH (ref 70–99)
Potassium: 4.2 mmol/L (ref 3.5–5.1)
Sodium: 142 mmol/L (ref 135–145)
Total Bilirubin: 0.9 mg/dL (ref 0.0–1.2)
Total Protein: 7.5 g/dL (ref 6.5–8.1)

## 2024-09-10 LAB — CBC WITH DIFFERENTIAL/PLATELET
Abs Immature Granulocytes: 0.01 K/uL (ref 0.00–0.07)
Basophils Absolute: 0 K/uL (ref 0.0–0.1)
Basophils Relative: 0 %
Eosinophils Absolute: 0.1 K/uL (ref 0.0–0.5)
Eosinophils Relative: 2 %
HCT: 45.1 % (ref 36.0–46.0)
Hemoglobin: 14.6 g/dL (ref 12.0–15.0)
Immature Granulocytes: 0 %
Lymphocytes Relative: 31 %
Lymphs Abs: 1.8 K/uL (ref 0.7–4.0)
MCH: 29.8 pg (ref 26.0–34.0)
MCHC: 32.4 g/dL (ref 30.0–36.0)
MCV: 92 fL (ref 80.0–100.0)
Monocytes Absolute: 0.4 K/uL (ref 0.1–1.0)
Monocytes Relative: 7 %
Neutro Abs: 3.6 K/uL (ref 1.7–7.7)
Neutrophils Relative %: 60 %
Platelets: 240 K/uL (ref 150–400)
RBC: 4.9 MIL/uL (ref 3.87–5.11)
RDW: 13.3 % (ref 11.5–15.5)
WBC: 5.9 K/uL (ref 4.0–10.5)
nRBC: 0 % (ref 0.0–0.2)

## 2024-09-10 LAB — TROPONIN T, HIGH SENSITIVITY: Troponin T High Sensitivity: <15 ng/L (ref 0–19)

## 2024-09-10 MED ORDER — ONDANSETRON 4 MG PO TBDP: 4.0000 mg | ORAL_TABLET | Freq: Three times a day (TID) | ORAL | 0 refills | Status: AC | PRN

## 2024-09-10 MED ORDER — LACTATED RINGERS IV BOLUS
1000.0000 mL | Freq: Once | INTRAVENOUS | Status: AC
  Administered 2024-09-10: 1000 mL via INTRAVENOUS

## 2024-09-10 MED ORDER — DIPHENHYDRAMINE HCL 50 MG/ML IJ SOLN
25.0000 mg | Freq: Once | INTRAMUSCULAR | Status: AC
  Administered 2024-09-10: 25 mg via INTRAVENOUS
  Filled 2024-09-10: qty 1

## 2024-09-10 MED ORDER — ONDANSETRON HCL 4 MG/2ML IJ SOLN
4.0000 mg | Freq: Once | INTRAMUSCULAR | Status: AC
  Administered 2024-09-10: 4 mg via INTRAVENOUS
  Filled 2024-09-10: qty 2

## 2024-09-10 MED ORDER — METOCLOPRAMIDE HCL 5 MG/ML IJ SOLN
10.0000 mg | Freq: Once | INTRAMUSCULAR | Status: AC
  Administered 2024-09-10: 10 mg via INTRAVENOUS
  Filled 2024-09-10: qty 2

## 2024-09-10 MED ORDER — KETOROLAC TROMETHAMINE 15 MG/ML IJ SOLN
15.0000 mg | Freq: Once | INTRAMUSCULAR | Status: AC
  Administered 2024-09-10: 15 mg via INTRAVENOUS
  Filled 2024-09-10: qty 1

## 2024-09-10 NOTE — ED Notes (Signed)
 Pt received 118ml of apple juice, pt drank all of it in one sip and reports not feeling nauseous. Pt appears stable at this time. Pt stated she does still feel dizzy at this time.

## 2024-09-10 NOTE — ED Provider Notes (Signed)
 [place] EMERGENCY DEPARTMENT AT [facility] Provider Note   CSN: [id number] Arrival date & time: 09/10/24  9362     Patient presents with: Headache   Alexandra Stein is a 58 y.o. female.   HPI 58 year old female presents with chest pressure and nausea.  For the past couple days she has had decreased appetite, nausea, feeling overall weak.  Last night started getting head pressure throughout her entire scalp.  Has progressively worsened since onset last night.  She has been having issues with the weakness and dizziness and nausea/decreased appetite for several months and has had workup including MRI and CT of head.  However the head pressure part is new.  No vomiting, neck stiffness, fever, vision complaints.  No focal weakness or numbness.  No chest pain but about 3-4 hours ago she had some transient dyspnea that she thought was more situational.  No new cough.  Prior to Admission medications   Medication Sig Start Date End Date Taking? Authorizing Provider  acetaminophen  (TYLENOL ) 500 MG tablet Take 1,000 mg by mouth every 6 (six) hours as needed for moderate pain.    [provider]  albuterol  (VENTOLIN  HFA) 108 (90 Base) MCG/ACT inhaler Inhale 2 puffs into the lungs every 6 (six) hours as needed. 01/12/23   Desai, Nikita S, MD  amphetamine-dextroamphetamine (ADDERALL XR) 20 MG 24 hr capsule Take 1 capsule (20 mg total) by mouth every morning. 08/05/24   Mozingo, Regina Nattalie, NP  amphetamine-dextroamphetamine (ADDERALL XR) 20 MG 24 hr capsule Take 1 capsule (20 mg total) by mouth daily. 09/02/24   Mozingo, Regina Nattalie, NP  amphetamine-dextroamphetamine (ADDERALL XR) 20 MG 24 hr capsule Take 1 capsule (20 mg total) by mouth daily. 09/30/24   Mozingo, Regina Nattalie, NP  ARIPiprazole  (ABILIFY ) 15 MG tablet Take 15 mg by mouth at bedtime. 08/29/19   [provider]  benzonatate  (TESSALON ) 100 MG capsule Take 1 capsule (100 mg total) by mouth every 8  (eight) hours. 09/26/23   Horton, Charmaine FALCON, MD  DULoxetine  (CYMBALTA ) 60 MG capsule Take 60 mg by mouth at bedtime.     [provider]  fluticasone  (FLONASE ) 50 MCG/ACT nasal spray Place 1 spray into both nostrils daily. Patient taking differently: Place 1 spray into both nostrils daily. PRN 01/12/23   Meade Verdon RAMAN, MD  furosemide  (LASIX ) 40 MG tablet Take 40 mg by mouth every morning. 08/29/19   [provider]  meclizine  (ANTIVERT ) 25 MG tablet Take 1 tablet (25 mg total) by mouth 3 (three) times daily as needed for dizziness. 03/10/24   Daralene Lonni BIRCH, PA-C  methocarbamol  (ROBAXIN ) 750 MG tablet Take 1 tablet (750 mg total) by mouth 3 (three) times daily as needed (muscle spasm/pain). 06/05/20   Bernard Drivers, MD  montelukast  (SINGULAIR ) 10 MG tablet TAKE 1 TABLET BY MOUTH EVERYDAY AT BEDTIME 12/20/23   Desai, Nikita S, MD  naproxen  (NAPROSYN ) 500 MG tablet Take 500 mg by mouth 2 (two) times daily as needed. 11/01/19   [provider]  OLANZapine  (ZYPREXA ) 10 MG tablet Take 10 mg by mouth at bedtime. 08/29/19   [provider]  ondansetron  (ZOFRAN -ODT) 4 MG disintegrating tablet Take 1 tablet (4 mg total) by mouth every 8 (eight) hours as needed for nausea or vomiting. 06/21/24   Towana Ozell BROCKS, MD  topiramate  (TOPAMAX ) 100 MG tablet Take 100 mg by mouth at bedtime.     [provider]  traMADol  (ULTRAM ) 50 MG tablet Take 50 mg  by mouth every 12 (twelve) hours as needed. 12/15/21   [provider]  omeprazole (PRILOSEC) 20 MG capsule Take 20 mg by mouth every morning. 07/18/19 03/31/20  [provider]    Allergies: Codeine    Review of Systems  Constitutional:  Positive for appetite change and fatigue. Negative for fever.  Eyes:  Negative for visual disturbance.  Cardiovascular:  Negative for chest pain.  Gastrointestinal:  Positive for nausea. Negative for abdominal pain and vomiting.  Musculoskeletal:  Negative for neck  pain and neck stiffness.  Neurological:  Positive for dizziness, weakness (generalized) and headaches. Negative for numbness.    Updated Vital Signs BP 124/78   Pulse 96   Temp 98.4 F (36.9 C)   Resp 18   Ht 5' 2 (1.575 m)   Wt 108.9 kg   LMP 08/18/2012   SpO2 95%   BMI 43.90 kg/m   Physical Exam Vitals and nursing note reviewed.  Constitutional:      General: She is not in acute distress.    Appearance: She is well-developed. She is not ill-appearing or diaphoretic.  HENT:     Head: Normocephalic and atraumatic.  Eyes:     Extraocular Movements: Extraocular movements intact.     Pupils: Pupils are equal, round, and reactive to light.  Cardiovascular:     Rate and Rhythm: Normal rate and regular rhythm.     Heart sounds: Normal heart sounds.  Pulmonary:     Effort: Pulmonary effort is normal.     Breath sounds: Normal breath sounds.  Abdominal:     Palpations: Abdomen is soft.     Tenderness: There is no abdominal tenderness.  Musculoskeletal:     Cervical back: Normal range of motion. No rigidity.  Skin:    General: Skin is warm and dry.  Neurological:     Mental Status: She is alert.     Comments: CN 3-12 grossly intact. 5/5 strength in all 4 extremities. Grossly normal sensation. Normal finger to nose.      (all labs ordered are listed, but only abnormal results are displayed) Labs Reviewed  COMPREHENSIVE METABOLIC PANEL WITH GFR - Abnormal; Notable for the following components:      Result Value   Glucose, Bld 102 (*)    All other components within normal limits  CBC WITH DIFFERENTIAL/PLATELET  TROPONIN T, HIGH SENSITIVITY    EKG: EKG Interpretation Date/Time:  Tuesday September 10 2024 08:09:19 EST Ventricular Rate:  66 PR Interval:  157 QRS Duration:  106 QT Interval:  429 QTC Calculation: 450 R Axis:   27  Text Interpretation: Sinus rhythm Nonspecific T abnrm, anterolateral leads no significant change since Nov 2024 Confirmed by Freddi Hamilton [phone]) on 09/10/2024 8:53:38 AM  Radiology: No results found.   Procedures   Medications Ordered in the ED  lactated ringers  bolus 1,000 mL (has no administration in time range)  metoCLOPramide  (REGLAN ) injection 10 mg (has no administration in time range)  diphenhydrAMINE (BENADRYL) injection 25 mg (has no administration in time range)  ketorolac  (TORADOL ) 15 MG/ML injection 15 mg (has no administration in time range)                                    Medical Decision Making Amount and/or Complexity of Data Reviewed External Data Reviewed: notes. Labs: ordered.    Details: Normal WBC ECG/medicine tests: ordered and independent interpretation performed.  Details: No change from baseline  Risk Prescription drug management.   Patient presents with nonspecific head pressure but also some nausea and dizziness.  This is a recurrent issue for her.  Looking through the chart, she has had both MRI and MRI of her brain as well as CT scans over the last several months.  Her exam is unremarkable.  Very low suspicion for meningitis, subarachnoid hemorrhage, other head bleed or stroke, or other acute CNS emergency.  After some treatment, the head pressure went away and then the nausea went away and she was able to drink some fluids.  She feels a lot better and wants to go home.  I think this is reasonable, did offer her some CNS imaging though again through shared decision making we decided to hold off.  Will have her return if any symptoms worsen but I think she is stable to follow-up with her PCP for these nonspecific and more chronic complaints.  Highly doubt ACS, had some transient dyspnea several hours ago but a normal troponin and an unchanged ECG.  Had some soft pressures after medication treatment but came back up and she was given some fluids.  Will discharge home with return precautions.     Final diagnoses:  Pressure in head    ED Discharge Orders     None           Freddi Hamilton, MD 09/10/24 1132

## 2024-09-10 NOTE — Discharge Instructions (Signed)
 Follow-up with your primary care provider.  Be sure to drink plenty of fluids.  We are providing you with a Zofran  prescription for recurrent nausea.  If you develop new or worsening headache, vomiting, fever, or any other new/concerning symptoms then return to the ER.

## 2024-09-10 NOTE — ED Triage Notes (Signed)
 Patient BIB [facility] c/o head pressure since last night.  Patient reports this has been an ongoing problem over the summer and never has been able to get a diagnosis.  138/89 HR 70 96% RA RR 16

## 2024-10-07 ENCOUNTER — Encounter (HOSPITAL_COMMUNITY): Payer: Self-pay | Admitting: Emergency Medicine

## 2024-10-07 ENCOUNTER — Emergency Department (HOSPITAL_COMMUNITY)

## 2024-10-07 ENCOUNTER — Emergency Department (HOSPITAL_COMMUNITY)
Admission: EM | Admit: 2024-10-07 | Discharge: 2024-10-08 | Disposition: A | Attending: Emergency Medicine | Admitting: Emergency Medicine

## 2024-10-07 ENCOUNTER — Other Ambulatory Visit: Payer: Self-pay

## 2024-10-07 DIAGNOSIS — S8392XA Sprain of unspecified site of left knee, initial encounter: Secondary | ICD-10-CM | POA: Insufficient documentation

## 2024-10-07 DIAGNOSIS — W1830XA Fall on same level, unspecified, initial encounter: Secondary | ICD-10-CM | POA: Insufficient documentation

## 2024-10-07 DIAGNOSIS — S8992XA Unspecified injury of left lower leg, initial encounter: Secondary | ICD-10-CM | POA: Diagnosis present

## 2024-10-07 NOTE — ED Triage Notes (Signed)
 Pt states while getting out of shower her left knee gave out and she fell. Pt c/o pain to the knee and states she cannot bear any weight.

## 2024-10-08 NOTE — ED Provider Notes (Signed)
 [place] EMERGENCY DEPARTMENT AT [facility] Provider Note   CSN: [id number] Arrival date & time: 10/07/24  2305     Patient presents with: Knee Pain   Alexandra Stein is a 58 y.o. female.   Patient is a 58 year old female with past medical history of hyperlipidemia, bipolar.  Patient presenting today with complaints of a left knee injury.  She reports getting out of the shower this evening, then her leg bending awkwardly and her falling to the floor.  She has had knee pain and difficulty bearing weight since.       Prior to Admission medications   Medication Sig Start Date End Date Taking? Authorizing Provider  acetaminophen  (TYLENOL ) 500 MG tablet Take 1,000 mg by mouth every 6 (six) hours as needed for moderate pain.    [provider]  albuterol  (VENTOLIN  HFA) 108 (90 Base) MCG/ACT inhaler Inhale 2 puffs into the lungs every 6 (six) hours as needed. 01/12/23   Desai, Nikita S, MD  amphetamine -dextroamphetamine (ADDERALL XR) 20 MG 24 hr capsule Take 1 capsule (20 mg total) by mouth every morning. 08/05/24   Mozingo, Regina Nattalie, NP  amphetamine -dextroamphetamine (ADDERALL XR) 20 MG 24 hr capsule Take 1 capsule (20 mg total) by mouth daily. 09/02/24   Mozingo, Regina Nattalie, NP  amphetamine -dextroamphetamine (ADDERALL XR) 20 MG 24 hr capsule Take 1 capsule (20 mg total) by mouth daily. 09/30/24   Mozingo, Regina Nattalie, NP  ARIPiprazole  (ABILIFY ) 15 MG tablet Take 15 mg by mouth at bedtime. 08/29/19   [provider]  benzonatate  (TESSALON ) 100 MG capsule Take 1 capsule (100 mg total) by mouth every 8 (eight) hours. 09/26/23   Horton, Charmaine FALCON, MD  DULoxetine  (CYMBALTA ) 60 MG capsule Take 60 mg by mouth at bedtime.     [provider]  fluticasone  (FLONASE ) 50 MCG/ACT nasal spray Place 1 spray into both nostrils daily. Patient taking differently: Place 1 spray into both nostrils daily. PRN 01/12/23   Meade Verdon RAMAN, MD  furosemide   (LASIX ) 40 MG tablet Take 40 mg by mouth every morning. 08/29/19   [provider]  meclizine  (ANTIVERT ) 25 MG tablet Take 1 tablet (25 mg total) by mouth 3 (three) times daily as needed for dizziness. 03/10/24   Daralene Lonni BIRCH, PA-C  methocarbamol  (ROBAXIN ) 750 MG tablet Take 1 tablet (750 mg total) by mouth 3 (three) times daily as needed (muscle spasm/pain). 06/05/20   Bernard Drivers, MD  montelukast  (SINGULAIR ) 10 MG tablet TAKE 1 TABLET BY MOUTH EVERYDAY AT BEDTIME 12/20/23   Desai, Nikita S, MD  naproxen  (NAPROSYN ) 500 MG tablet Take 500 mg by mouth 2 (two) times daily as needed. 11/01/19   [provider]  OLANZapine  (ZYPREXA ) 10 MG tablet Take 10 mg by mouth at bedtime. 08/29/19   [provider]  ondansetron  (ZOFRAN -ODT) 4 MG disintegrating tablet Take 1 tablet (4 mg total) by mouth every 8 (eight) hours as needed for nausea or vomiting. 09/10/24   Freddi Hamilton, MD  topiramate  (TOPAMAX ) 100 MG tablet Take 100 mg by mouth at bedtime.     [provider]  traMADol  (ULTRAM ) 50 MG tablet Take 50 mg by mouth every 12 (twelve) hours as needed. 12/15/21   [provider]  omeprazole (PRILOSEC) 20 MG capsule Take 20 mg by mouth every morning. 07/18/19 03/31/20  [provider]    Allergies: Codeine    Review of Systems  All other systems reviewed and are negative.   Updated Vital  Signs BP 124/70   Pulse 64   Temp 98.1 F (36.7 C)   Resp 17   Ht 5' 2 (1.575 m)   Wt 108.9 kg   LMP 08/18/2012   SpO2 99%   BMI 43.90 kg/m   Physical Exam Vitals and nursing note reviewed.  Constitutional:      Appearance: Normal appearance.  Pulmonary:     Effort: Pulmonary effort is normal.  Musculoskeletal:     Comments: The left knee appears grossly normal.  There is no palpable effusion.  She has good range of motion without crepitus.  Anterior and posterior drawer test is negative and there is no varus or valgus laxity.  Skin:    General:  Skin is warm and dry.  Neurological:     Mental Status: She is alert and oriented to person, place, and time.     (all labs ordered are listed, but only abnormal results are displayed) Labs Reviewed - No data to display  EKG: None  Radiology: DG Knee Complete 4 Views Left Result Date: 10/08/2024 EXAM: 4 VIEW(S) XRAY OF THE LEFT KNEE 10/08/2024 12:03:00 AM COMPARISON: None available. CLINICAL HISTORY: pain FINDINGS: BONES AND JOINTS: No acute fracture. No focal osseous lesion. No joint dislocation. No significant joint effusion. No significant degenerative changes. SOFT TISSUES: The soft tissues are unremarkable. IMPRESSION: 1. No acute findings. Electronically signed by: Norman Gatlin MD 10/08/2024 12:07 AM EST RP Workstation: [id number]     Procedures   Medications Ordered in the ED - No data to display                                  Medical Decision Making Amount and/or Complexity of Data Reviewed Radiology: ordered.   Patient presenting with a left knee injury as described in the HPI.  Her x-rays are negative and presentation and exam is most consistent with a sprain.  She will be treated with an Ace bandage, rest, ice, crutches, and follow-up if not improving.     Final diagnoses:  None    ED Discharge Orders     None          Geroldine Berg, MD 10/08/24 [phone]

## 2024-10-08 NOTE — Discharge Instructions (Signed)
 Wear Ace bandage for comfort and support.  Ice for 20 minutes every 2 hours while awake for the next 2 days.  Take your tramadol  as prescribed as needed for pain.  Follow-up with primary doctor if not improving in the next 1 to 2 weeks.

## 2024-10-08 NOTE — ED Notes (Signed)
 Left knee wrapped with ace bandage and crutches given.
